# Patient Record
Sex: Female | Born: 1980 | Race: White | Hispanic: No | Smoking: Current every day smoker
Health system: Southern US, Community
[De-identification: ages and names within clinical notes are randomized; demographics above are authoritative.]

## PROBLEM LIST (undated history)

## (undated) DIAGNOSIS — F1110 Opioid abuse, uncomplicated: Secondary | ICD-10-CM

## (undated) DIAGNOSIS — B1920 Unspecified viral hepatitis C without hepatic coma: Secondary | ICD-10-CM

## (undated) DIAGNOSIS — F319 Bipolar disorder, unspecified: Secondary | ICD-10-CM

## (undated) DIAGNOSIS — F1010 Alcohol abuse, uncomplicated: Secondary | ICD-10-CM

## (undated) DIAGNOSIS — A4902 Methicillin resistant Staphylococcus aureus infection, unspecified site: Secondary | ICD-10-CM

## (undated) DIAGNOSIS — X838XXA Intentional self-harm by other specified means, initial encounter: Secondary | ICD-10-CM

## (undated) DIAGNOSIS — F419 Anxiety disorder, unspecified: Secondary | ICD-10-CM

## (undated) DIAGNOSIS — R45851 Suicidal ideations: Secondary | ICD-10-CM

## (undated) DIAGNOSIS — F1490 Cocaine use, unspecified, uncomplicated: Secondary | ICD-10-CM

## (undated) DIAGNOSIS — Z720 Tobacco use: Secondary | ICD-10-CM

## (undated) HISTORY — PX: CHOLECYSTECTOMY: SHX55

## (undated) HISTORY — PX: OTHER SURGICAL HISTORY: SHX169

## (undated) HISTORY — PX: EYE SURGERY: SHX253

## (undated) HISTORY — PX: TUBAL LIGATION: SHX77

---

## 2002-05-31 ENCOUNTER — Emergency Department (HOSPITAL_COMMUNITY): Admission: EM | Admit: 2002-05-31 | Discharge: 2002-05-31 | Payer: Self-pay | Admitting: *Deleted

## 2002-05-31 ENCOUNTER — Encounter: Payer: Self-pay | Admitting: *Deleted

## 2004-08-01 ENCOUNTER — Ambulatory Visit: Payer: Self-pay | Admitting: Psychiatry

## 2004-08-01 ENCOUNTER — Inpatient Hospital Stay (HOSPITAL_COMMUNITY): Admission: AD | Admit: 2004-08-01 | Discharge: 2004-08-07 | Payer: Self-pay | Admitting: Psychiatry

## 2004-08-05 ENCOUNTER — Encounter: Payer: Self-pay | Admitting: Psychiatry

## 2004-08-06 ENCOUNTER — Encounter (HOSPITAL_COMMUNITY): Payer: Self-pay | Admitting: Psychiatry

## 2005-01-10 ENCOUNTER — Emergency Department (HOSPITAL_COMMUNITY): Admission: EM | Admit: 2005-01-10 | Discharge: 2005-01-11 | Payer: Self-pay | Admitting: Emergency Medicine

## 2005-08-23 ENCOUNTER — Emergency Department (HOSPITAL_COMMUNITY): Admission: EM | Admit: 2005-08-23 | Discharge: 2005-08-23 | Payer: Self-pay | Admitting: Emergency Medicine

## 2005-10-04 ENCOUNTER — Inpatient Hospital Stay (HOSPITAL_COMMUNITY): Admission: AD | Admit: 2005-10-04 | Discharge: 2005-10-10 | Payer: Self-pay | Admitting: Psychiatry

## 2005-10-05 ENCOUNTER — Ambulatory Visit: Payer: Self-pay | Admitting: Psychiatry

## 2008-11-19 ENCOUNTER — Emergency Department (HOSPITAL_COMMUNITY): Admission: EM | Admit: 2008-11-19 | Discharge: 2008-11-19 | Payer: Self-pay | Admitting: Emergency Medicine

## 2009-01-09 ENCOUNTER — Emergency Department (HOSPITAL_COMMUNITY): Admission: EM | Admit: 2009-01-09 | Discharge: 2009-01-09 | Payer: Self-pay | Admitting: Emergency Medicine

## 2009-01-16 ENCOUNTER — Encounter: Payer: Self-pay | Admitting: Orthopedic Surgery

## 2009-01-22 ENCOUNTER — Emergency Department (HOSPITAL_COMMUNITY): Admission: EM | Admit: 2009-01-22 | Discharge: 2009-01-22 | Payer: Self-pay | Admitting: Emergency Medicine

## 2009-04-26 ENCOUNTER — Other Ambulatory Visit: Payer: Self-pay | Admitting: Emergency Medicine

## 2009-04-26 ENCOUNTER — Inpatient Hospital Stay (HOSPITAL_COMMUNITY): Admission: AD | Admit: 2009-04-26 | Discharge: 2009-05-02 | Payer: Self-pay | Admitting: Psychiatry

## 2009-04-26 ENCOUNTER — Ambulatory Visit: Payer: Self-pay | Admitting: Psychiatry

## 2009-04-29 ENCOUNTER — Encounter: Payer: Self-pay | Admitting: Emergency Medicine

## 2009-05-04 ENCOUNTER — Emergency Department (HOSPITAL_COMMUNITY): Admission: EM | Admit: 2009-05-04 | Discharge: 2009-05-04 | Payer: Self-pay | Admitting: Emergency Medicine

## 2009-05-06 ENCOUNTER — Emergency Department (HOSPITAL_COMMUNITY): Admission: EM | Admit: 2009-05-06 | Discharge: 2009-05-07 | Payer: Self-pay | Admitting: Emergency Medicine

## 2009-12-11 ENCOUNTER — Other Ambulatory Visit: Payer: Self-pay | Admitting: Emergency Medicine

## 2009-12-11 ENCOUNTER — Inpatient Hospital Stay (HOSPITAL_COMMUNITY): Admission: AD | Admit: 2009-12-11 | Discharge: 2009-12-13 | Payer: Self-pay | Admitting: Psychiatry

## 2009-12-11 ENCOUNTER — Ambulatory Visit: Payer: Self-pay | Admitting: Psychiatry

## 2010-02-13 ENCOUNTER — Inpatient Hospital Stay (HOSPITAL_COMMUNITY): Admission: EM | Admit: 2010-02-13 | Discharge: 2009-05-19 | Payer: Self-pay | Admitting: Emergency Medicine

## 2010-02-19 ENCOUNTER — Emergency Department (HOSPITAL_COMMUNITY)
Admission: EM | Admit: 2010-02-19 | Discharge: 2010-02-19 | Payer: Self-pay | Source: Home / Self Care | Admitting: Emergency Medicine

## 2010-03-02 ENCOUNTER — Inpatient Hospital Stay (HOSPITAL_COMMUNITY)
Admission: EM | Admit: 2010-03-02 | Discharge: 2010-03-06 | Disposition: A | Discharge status: Other Acute Inpatient Hospital | Payer: Self-pay | Source: Home / Self Care | Attending: Pulmonary Disease | Admitting: Pulmonary Disease

## 2010-03-05 DIAGNOSIS — F329 Major depressive disorder, single episode, unspecified: Secondary | ICD-10-CM

## 2010-03-06 ENCOUNTER — Inpatient Hospital Stay (HOSPITAL_COMMUNITY)
Admission: RE | Admit: 2010-03-06 | Discharge: 2010-03-12 | Payer: Self-pay | Source: Home / Self Care | Attending: Psychiatry | Admitting: Psychiatry

## 2010-05-19 LAB — ETHANOL: Alcohol, Ethyl (B): <5 mg/dL (ref 0–10)

## 2010-05-19 LAB — BASIC METABOLIC PANEL
BUN: 2 mg/dL — ABNORMAL LOW (ref 6–23)
BUN: 3 mg/dL — ABNORMAL LOW (ref 6–23)
CO2: 27 mEq/L (ref 19–32)
Calcium: 7.8 mg/dL — ABNORMAL LOW (ref 8.4–10.5)
Calcium: 8.1 mg/dL — ABNORMAL LOW (ref 8.4–10.5)
Chloride: 110 mEq/L (ref 96–112)
Creatinine, Ser: 0.83 mg/dL (ref 0.4–1.2)
Creatinine, Ser: 0.84 mg/dL (ref 0.4–1.2)
Creatinine, Ser: 0.89 mg/dL (ref 0.4–1.2)
GFR calc non Af Amer: >60 mL/min (ref >60)
GFR calc non Af Amer: >60 mL/min (ref >60)
GFR calc non Af Amer: >60 mL/min (ref >60)
Glucose, Bld: 125 mg/dL — ABNORMAL HIGH (ref 70–99)
Glucose, Bld: 94 mg/dL (ref 70–99)
Potassium: 3.7 mEq/L (ref 3.5–5.1)
Sodium: 140 mEq/L (ref 135–145)
Sodium: 141 mEq/L (ref 135–145)

## 2010-05-19 LAB — COMPREHENSIVE METABOLIC PANEL
ALT: 18 U/L (ref 0–35)
ALT: 19 U/L (ref 0–35)
AST: 18 U/L (ref 0–37)
Albumin: 2.8 g/dL — ABNORMAL LOW (ref 3.5–5.2)
Alkaline Phosphatase: 44 U/L (ref 39–117)
Alkaline Phosphatase: 62 U/L (ref 39–117)
BUN: 12 mg/dL (ref 6–23)
CO2: 24 mEq/L (ref 19–32)
CO2: 24 mEq/L (ref 19–32)
CO2: 29 mEq/L (ref 19–32)
Calcium: 8.6 mg/dL (ref 8.4–10.5)
Chloride: 108 mEq/L (ref 96–112)
Chloride: 109 mEq/L (ref 96–112)
Chloride: 111 mEq/L (ref 96–112)
Creatinine, Ser: 0.83 mg/dL (ref 0.4–1.2)
Creatinine, Ser: 0.84 mg/dL (ref 0.4–1.2)
GFR calc Af Amer: >60 mL/min (ref >60)
GFR calc Af Amer: >60 mL/min (ref >60)
GFR calc Af Amer: >60 mL/min (ref >60)
GFR calc non Af Amer: >60 mL/min (ref >60)
GFR calc non Af Amer: >60 mL/min (ref >60)
GFR calc non Af Amer: >60 mL/min (ref >60)
Glucose, Bld: 232 mg/dL — ABNORMAL HIGH (ref 70–99)
Potassium: 3.4 mEq/L — ABNORMAL LOW (ref 3.5–5.1)
Potassium: 3.6 mEq/L (ref 3.5–5.1)
Potassium: 3.7 mEq/L (ref 3.5–5.1)
Sodium: 142 mEq/L (ref 135–145)
Total Bilirubin: 0.1 mg/dL — ABNORMAL LOW (ref 0.3–1.2)
Total Bilirubin: 0.3 mg/dL (ref 0.3–1.2)
Total Bilirubin: 0.4 mg/dL (ref 0.3–1.2)

## 2010-05-19 LAB — BLOOD GAS, ARTERIAL
Acid-Base Excess: 1.5 mmol/L (ref 0.0–2.0)
Acid-base deficit: 4.5 mmol/L — ABNORMAL HIGH (ref 0.0–2.0)
Acid-base deficit: 5.2 mmol/L — ABNORMAL HIGH (ref 0.0–2.0)
Bicarbonate: 20.8 mEq/L (ref 20.0–24.0)
Bicarbonate: 21.1 mEq/L (ref 20.0–24.0)
Drawn by: 308601
MECHVT: 500 mL
PEEP: 5 cmH2O
PEEP: 5 cmH2O
Patient temperature: 98.6
RATE: 15 resp/min
TCO2: 19.3 mmol/L (ref 0–100)
TCO2: 19.7 mmol/L (ref 0–100)
pCO2 arterial: 45.3 mmHg — ABNORMAL HIGH (ref 35.0–45.0)
pH, Arterial: 7.285 — ABNORMAL LOW (ref 7.350–7.400)
pH, Arterial: 7.457 — ABNORMAL HIGH (ref 7.350–7.400)

## 2010-05-19 LAB — CBC
HCT: 40.2 % (ref 36.0–46.0)
Hemoglobin: 10.2 g/dL — ABNORMAL LOW (ref 12.0–15.0)
Hemoglobin: 12.9 g/dL (ref 12.0–15.0)
Hemoglobin: 13.1 g/dL (ref 12.0–15.0)
Hemoglobin: 9.9 g/dL — ABNORMAL LOW (ref 12.0–15.0)
MCH: 28.1 pg (ref 26.0–34.0)
MCH: 28.5 pg (ref 26.0–34.0)
MCHC: 30.4 g/dL (ref 30.0–36.0)
MCV: 91.2 fL (ref 78.0–100.0)
Platelets: 224 10*3/uL (ref 150–400)
RBC: 4.45 MIL/uL (ref 3.87–5.11)
RBC: 4.71 MIL/uL (ref 3.87–5.11)
RDW: 15.6 % — ABNORMAL HIGH (ref 11.5–15.5)
RDW: 16.2 % — ABNORMAL HIGH (ref 11.5–15.5)
RDW: 16.3 % — ABNORMAL HIGH (ref 11.5–15.5)
WBC: 12.1 10*3/uL — ABNORMAL HIGH (ref 4.0–10.5)
WBC: 15.1 10*3/uL — ABNORMAL HIGH (ref 4.0–10.5)

## 2010-05-19 LAB — MAGNESIUM: Magnesium: 2.4 mg/dL (ref 1.5–2.5)

## 2010-05-19 LAB — URINALYSIS, ROUTINE W REFLEX MICROSCOPIC
Bilirubin Urine: NEGATIVE
Glucose, UA: NEGATIVE mg/dL
Ketones, ur: NEGATIVE mg/dL
Nitrite: NEGATIVE
Protein, ur: NEGATIVE mg/dL
pH: 5.5 (ref 5.0–8.0)

## 2010-05-19 LAB — URINE MICROSCOPIC-ADD ON

## 2010-05-19 LAB — RAPID URINE DRUG SCREEN, HOSP PERFORMED
Barbiturates: NOT DETECTED
Barbiturates: NOT DETECTED
Opiates: NOT DETECTED
Opiates: NOT DETECTED
Tetrahydrocannabinol: POSITIVE — AB

## 2010-05-19 LAB — MRSA PCR SCREENING: MRSA by PCR: NEGATIVE

## 2010-05-19 LAB — PHOSPHORUS
Phosphorus: 2.4 mg/dL (ref 2.3–4.6)
Phosphorus: 3.6 mg/dL (ref 2.3–4.6)

## 2010-05-19 LAB — ACETAMINOPHEN LEVEL: Acetaminophen (Tylenol), Serum: <10 ug/mL — ABNORMAL LOW (ref 10–30)

## 2010-05-19 LAB — DIFFERENTIAL
Basophils Absolute: 0.1 10*3/uL (ref 0.0–0.1)
Eosinophils Relative: 1 % (ref 0–5)
Lymphocytes Relative: 26 % (ref 12–46)
Lymphs Abs: 3.1 10*3/uL (ref 0.7–4.0)
Monocytes Absolute: 0.4 10*3/uL (ref 0.1–1.0)

## 2010-05-19 LAB — PROTIME-INR: Prothrombin Time: 13.9 seconds (ref 11.6–15.2)

## 2010-05-21 LAB — HEPATIC FUNCTION PANEL
ALT: 14 U/L (ref 0–35)
Bilirubin, Direct: 0.1 mg/dL (ref 0.0–0.3)
Indirect Bilirubin: 0.8 mg/dL (ref 0.3–0.9)

## 2010-05-22 LAB — DIFFERENTIAL
Eosinophils Absolute: 0.2 10*3/uL (ref 0.0–0.7)
Lymphs Abs: 2 10*3/uL (ref 0.7–4.0)
Monocytes Relative: 4 % (ref 3–12)
Neutro Abs: 13 10*3/uL — ABNORMAL HIGH (ref 1.7–7.7)
Neutrophils Relative %: 82 % — ABNORMAL HIGH (ref 43–77)

## 2010-05-22 LAB — URINE MICROSCOPIC-ADD ON

## 2010-05-22 LAB — RAPID URINE DRUG SCREEN, HOSP PERFORMED
Opiates: NOT DETECTED
Tetrahydrocannabinol: POSITIVE — AB

## 2010-05-22 LAB — CBC
Hemoglobin: 12.8 g/dL (ref 12.0–15.0)
MCH: 28.1 pg (ref 26.0–34.0)
RBC: 4.56 MIL/uL (ref 3.87–5.11)

## 2010-05-22 LAB — URINALYSIS, ROUTINE W REFLEX MICROSCOPIC
Glucose, UA: NEGATIVE mg/dL
Hgb urine dipstick: NEGATIVE
Specific Gravity, Urine: 1.021 (ref 1.005–1.030)
pH: 6 (ref 5.0–8.0)

## 2010-05-22 LAB — POCT I-STAT, CHEM 8
Calcium, Ion: 1.25 mmol/L (ref 1.12–1.32)
HCT: 42 % (ref 36.0–46.0)
TCO2: 32 mmol/L (ref 0–100)

## 2010-05-22 LAB — GC/CHLAMYDIA PROBE AMP, GENITAL: GC Probe Amp, Genital: NEGATIVE

## 2010-05-29 LAB — URINE MICROSCOPIC-ADD ON

## 2010-05-29 LAB — POCT I-STAT, CHEM 8
BUN: 12 mg/dL (ref 6–23)
Chloride: 107 mEq/L (ref 96–112)
Creatinine, Ser: 1.4 mg/dL — ABNORMAL HIGH (ref 0.4–1.2)
Glucose, Bld: 90 mg/dL (ref 70–99)
Potassium: 3.3 mEq/L — ABNORMAL LOW (ref 3.5–5.1)
Sodium: 141 mEq/L (ref 135–145)

## 2010-05-29 LAB — ETHANOL: Alcohol, Ethyl (B): 5 mg/dL (ref 0–10)

## 2010-05-29 LAB — URINALYSIS, ROUTINE W REFLEX MICROSCOPIC
Glucose, UA: NEGATIVE mg/dL
Hgb urine dipstick: NEGATIVE
Leukocytes, UA: NEGATIVE
Specific Gravity, Urine: 1.03 (ref 1.005–1.030)
Urobilinogen, UA: 1 mg/dL (ref 0.0–1.0)

## 2010-05-29 LAB — DIFFERENTIAL
Eosinophils Absolute: 0 10*3/uL (ref 0.0–0.7)
Lymphocytes Relative: 10 % — ABNORMAL LOW (ref 12–46)
Lymphs Abs: 1.9 10*3/uL (ref 0.7–4.0)
Monocytes Relative: 4 % (ref 3–12)
Neutro Abs: 17.4 10*3/uL — ABNORMAL HIGH (ref 1.7–7.7)
Neutrophils Relative %: 86 % — ABNORMAL HIGH (ref 43–77)

## 2010-05-29 LAB — CBC
MCV: 85.3 fL (ref 78.0–100.0)
RBC: 4.42 MIL/uL (ref 3.87–5.11)
WBC: 20.3 10*3/uL — ABNORMAL HIGH (ref 4.0–10.5)

## 2010-05-29 LAB — RAPID URINE DRUG SCREEN, HOSP PERFORMED
Amphetamines: NOT DETECTED
Barbiturates: NOT DETECTED

## 2010-06-01 LAB — BASIC METABOLIC PANEL
Chloride: 103 mEq/L (ref 96–112)
Creatinine, Ser: 0.67 mg/dL (ref 0.4–1.2)
GFR calc Af Amer: >60 mL/min (ref >60)
Potassium: 4.2 mEq/L (ref 3.5–5.1)
Sodium: 139 mEq/L (ref 135–145)

## 2010-06-01 LAB — URINALYSIS, ROUTINE W REFLEX MICROSCOPIC
Bilirubin Urine: NEGATIVE
Glucose, UA: NEGATIVE mg/dL
Hgb urine dipstick: NEGATIVE
Specific Gravity, Urine: 1.021 (ref 1.005–1.030)
Urobilinogen, UA: 0.2 mg/dL (ref 0.0–1.0)
pH: 6.5 (ref 5.0–8.0)

## 2010-06-01 LAB — GRAM STAIN

## 2010-06-01 LAB — CBC
HCT: 32.2 % — ABNORMAL LOW (ref 36.0–46.0)
HCT: 38.9 % (ref 36.0–46.0)
Hemoglobin: 11.1 g/dL — ABNORMAL LOW (ref 12.0–15.0)
Hemoglobin: 11.2 g/dL — ABNORMAL LOW (ref 12.0–15.0)
Hemoglobin: 12.9 g/dL (ref 12.0–15.0)
MCHC: 33.1 g/dL (ref 30.0–36.0)
MCHC: 33.5 g/dL (ref 30.0–36.0)
MCV: 85.1 fL (ref 78.0–100.0)
MCV: 85.6 fL (ref 78.0–100.0)
MCV: 86.2 fL (ref 78.0–100.0)
Platelets: 354 10*3/uL (ref 150–400)
Platelets: 357 10*3/uL (ref 150–400)
RBC: 3.87 MIL/uL (ref 3.87–5.11)
RBC: 3.9 MIL/uL (ref 3.87–5.11)
RBC: 4.51 MIL/uL (ref 3.87–5.11)
RDW: 15.4 % (ref 11.5–15.5)
RDW: 15.5 % (ref 11.5–15.5)
RDW: 15.5 % (ref 11.5–15.5)
WBC: 13.1 10*3/uL — ABNORMAL HIGH (ref 4.0–10.5)
WBC: 20 10*3/uL — ABNORMAL HIGH (ref 4.0–10.5)

## 2010-06-01 LAB — POCT I-STAT, CHEM 8
Calcium, Ion: 1.13 mmol/L (ref 1.12–1.32)
Creatinine, Ser: 1 mg/dL (ref 0.4–1.2)
Glucose, Bld: 85 mg/dL (ref 70–99)
HCT: 41 % (ref 36.0–46.0)
Hemoglobin: 13.9 g/dL (ref 12.0–15.0)
TCO2: 33 mmol/L (ref 0–100)

## 2010-06-01 LAB — CULTURE, BLOOD (ROUTINE X 2)

## 2010-06-01 LAB — WOUND CULTURE

## 2010-06-01 LAB — DIFFERENTIAL
Basophils Relative: 1 % (ref 0–1)
Eosinophils Absolute: 0.4 10*3/uL (ref 0.0–0.7)
Lymphs Abs: 2.7 10*3/uL (ref 0.7–4.0)
Monocytes Absolute: 1 10*3/uL (ref 0.1–1.0)
Monocytes Relative: 5 % (ref 3–12)

## 2010-06-01 LAB — CULTURE, ROUTINE-ABSCESS

## 2010-06-01 LAB — POCT PREGNANCY, URINE: Preg Test, Ur: NEGATIVE

## 2010-06-01 LAB — ANAEROBIC CULTURE

## 2010-06-07 ENCOUNTER — Emergency Department (HOSPITAL_COMMUNITY)
Admission: EM | Admit: 2010-06-07 | Discharge: 2010-06-07 | Disposition: A | Discharge status: Home / Self Care | Payer: Medicaid Other | Attending: Emergency Medicine | Admitting: Emergency Medicine

## 2010-06-07 DIAGNOSIS — M545 Low back pain, unspecified: Secondary | ICD-10-CM | POA: Insufficient documentation

## 2010-06-07 DIAGNOSIS — R350 Frequency of micturition: Secondary | ICD-10-CM | POA: Insufficient documentation

## 2010-06-07 DIAGNOSIS — K2970 Gastritis, unspecified, without bleeding: Secondary | ICD-10-CM | POA: Insufficient documentation

## 2010-06-07 DIAGNOSIS — F319 Bipolar disorder, unspecified: Secondary | ICD-10-CM | POA: Insufficient documentation

## 2010-06-07 LAB — URINALYSIS, ROUTINE W REFLEX MICROSCOPIC
Glucose, UA: NEGATIVE mg/dL
Protein, ur: NEGATIVE mg/dL
pH: 7.5 (ref 5.0–8.0)
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
## 2010-07-25 NOTE — Discharge Summary (Signed)
NAMEJANYA, EVELAND NO.:  [id number]   MEDICAL RECORD NO.:  [id number]          PATIENT TYPE:  IPS   LOCATION:  0305                          [facility]ACILITY:  [facility]   PHYSICIAN:  Anselm Jungling, MD  DATE OF BIRTH:  Sep 24, 1980   DATE OF ADMISSION:  10/04/2005  DATE OF DISCHARGE:  10/10/2005                                 DISCHARGE SUMMARY   IDENTIFYING DATA AND REASON FOR ADMISSION:  The patient is a 30 year old  single white female, mother of two young children, homeless and jobless,  with a history of bipolar disorder and substance abuse.  This was her second  [facility] admission.  She was admitted after taking an overdose of a combination  of pills and alcohol.  Despite what appeared to be a fairly apparent history  of heavy irregular drinking, she stated, I am not an alcoholic.  She had  been prescribed Xanax, Paxil, and trazodone via her primary care physician.  She stated that these medications are not when I need.  Upon admission and  throughout her inpatient stay, she denied current suicidal ideation, but she  did want help.  Please refer to the admission note for further details  pertaining to the symptoms, circumstances, and history that led to her  hospitalization.  She was given initial Axis I diagnoses of history of  bipolar disorder, NOS, and history of substance abuse.   MEDICAL AND LABORATORY:  The patient was medically and physically assessed  by the psychiatric nurse practitioner.  There were no significant medical  issues.  She was essentially in good health.   HOSPITAL COURSE:  The patient was admitted to the adult inpatient  psychiatric service.  She presented as a well-nourished, well-developed  female who was alert and fully oriented, pleasant and relaxed.  Her mood  appeared neutral, though she complained of depression.  Her affect was  appropriate.  There were no signs or symptoms of psychosis or thought  disorder.  She denied suicidal  ideation.   The patient was placed on a regimen of medications intended to stabilize an  underlying bipolar disorder.  This consisted of Risperdal in relatively low  doses, and Depakote ER.  These medications were started at low-doses and  increased stepwise.  The patient appeared to have a large tolerance for  medication, necessitating several stepwise increases.  She experienced some  mild muscle cramping with Risperdal, and for this was given Cogentin 1 mg  t.i.d. with good results.   On October 09, 2005, the day prior to discharge, her valproic acid level was  51, on a dose of Depakote ER 1250 mg daily.  Following this, her Depakote  dose was increased to 1500 mg daily.   The patient was a reasonably good participant in the treatment program.  She  was cooperative, and got along well with peers and staff alike.  She was  placed briefly on a Librium withdrawal protocol, based upon an understanding  of heavy alcohol consumption, but she did not really show any signs or  symptoms of alcohol withdrawal during her stay.   On  the seventh hospital day, the patient appeared appropriate for discharge,  and she felt ready to continue on outpatient treatment.   AFTERCARE:  The patient was to follow up with psychiatric and counseling  resources that had yet to be scheduled at the time of this dictation, due to  her discharge occurring on the weekend.   DISCHARGE MEDICATIONS:  1.  Risperdal 1 mg b.i.d. and 2 mg q.h.s.  2.  Cogentin 1 mg t.i.d.  3.  Depakote ER 1500 mg q.h.s.  4.  Risperdal 0.5 mg up to b.i.d. p.r.n. agitation.  5.  Ambien 10 mg q.h.s. p.r.n. insomnia.  6.  Trazodone 200 mg q.h.s.   DISCHARGE DIAGNOSES:  AXIS I:  Bipolar disorder, not otherwise specified.  History of polysubstance abuse.  AXIS II:  Deferred.  AXIS III:  No acute or chronic illnesses.  AXIS IV:  Stressors severe.  AXIS V:  Global Assessment of Functioning on discharge 60.            ______________________________  Anselm Jungling, MD  Electronically Signed     SPB/MEDQ  D:  10/12/2005  T:  10/12/2005  Job:  [id number]

## 2010-10-04 ENCOUNTER — Emergency Department (HOSPITAL_COMMUNITY): Payer: No Typology Code available for payment source

## 2010-10-04 ENCOUNTER — Inpatient Hospital Stay (HOSPITAL_COMMUNITY)
Admission: EM | Admit: 2010-10-04 | Discharge: 2010-10-07 | DRG: 510 | Disposition: A | Discharge status: Home / Self Care | Payer: No Typology Code available for payment source | Attending: General Surgery | Admitting: General Surgery

## 2010-10-04 ENCOUNTER — Inpatient Hospital Stay (HOSPITAL_COMMUNITY): Payer: No Typology Code available for payment source

## 2010-10-04 DIAGNOSIS — S066X9A Traumatic subarachnoid hemorrhage with loss of consciousness of unspecified duration, initial encounter: Secondary | ICD-10-CM

## 2010-10-04 DIAGNOSIS — D62 Acute posthemorrhagic anemia: Secondary | ICD-10-CM | POA: Diagnosis present

## 2010-10-04 DIAGNOSIS — S066XAA Traumatic subarachnoid hemorrhage with loss of consciousness status unknown, initial encounter: Secondary | ICD-10-CM

## 2010-10-04 DIAGNOSIS — S52209A Unspecified fracture of shaft of unspecified ulna, initial encounter for closed fracture: Secondary | ICD-10-CM | POA: Diagnosis present

## 2010-10-04 DIAGNOSIS — S0180XA Unspecified open wound of other part of head, initial encounter: Secondary | ICD-10-CM

## 2010-10-04 DIAGNOSIS — S41109A Unspecified open wound of unspecified upper arm, initial encounter: Secondary | ICD-10-CM | POA: Diagnosis present

## 2010-10-04 DIAGNOSIS — S0230XA Fracture of orbital floor, unspecified side, initial encounter for closed fracture: Secondary | ICD-10-CM | POA: Diagnosis present

## 2010-10-04 DIAGNOSIS — S51009A Unspecified open wound of unspecified elbow, initial encounter: Secondary | ICD-10-CM | POA: Diagnosis present

## 2010-10-04 DIAGNOSIS — S129XXA Fracture of neck, unspecified, initial encounter: Secondary | ICD-10-CM

## 2010-10-04 DIAGNOSIS — Y998 Other external cause status: Secondary | ICD-10-CM

## 2010-10-04 DIAGNOSIS — J329 Chronic sinusitis, unspecified: Secondary | ICD-10-CM | POA: Diagnosis present

## 2010-10-04 DIAGNOSIS — Y92410 Unspecified street and highway as the place of occurrence of the external cause: Secondary | ICD-10-CM

## 2010-10-04 DIAGNOSIS — Z1889 Other specified retained foreign body fragments: Secondary | ICD-10-CM

## 2010-10-04 DIAGNOSIS — F319 Bipolar disorder, unspecified: Secondary | ICD-10-CM | POA: Diagnosis present

## 2010-10-04 DIAGNOSIS — S12000A Unspecified displaced fracture of first cervical vertebra, initial encounter for closed fracture: Principal | ICD-10-CM | POA: Diagnosis present

## 2010-10-04 DIAGNOSIS — S61209A Unspecified open wound of unspecified finger without damage to nail, initial encounter: Secondary | ICD-10-CM | POA: Diagnosis present

## 2010-10-04 DIAGNOSIS — F17200 Nicotine dependence, unspecified, uncomplicated: Secondary | ICD-10-CM | POA: Diagnosis present

## 2010-10-04 DIAGNOSIS — S0100XA Unspecified open wound of scalp, initial encounter: Secondary | ICD-10-CM | POA: Diagnosis present

## 2010-10-04 LAB — URINALYSIS, ROUTINE W REFLEX MICROSCOPIC
Bilirubin Urine: NEGATIVE
Ketones, ur: NEGATIVE mg/dL
Leukocytes, UA: NEGATIVE
Nitrite: NEGATIVE
Protein, ur: NEGATIVE mg/dL
Urobilinogen, UA: 0.2 mg/dL (ref 0.0–1.0)

## 2010-10-04 LAB — BASIC METABOLIC PANEL
BUN: 7 mg/dL (ref 6–23)
Chloride: 105 mEq/L (ref 96–112)
Creatinine, Ser: 0.68 mg/dL (ref 0.50–1.10)
GFR calc Af Amer: >60 mL/min (ref >60)
GFR calc non Af Amer: >60 mL/min (ref >60)

## 2010-10-04 LAB — DIFFERENTIAL
Basophils Relative: 0 % (ref 0–1)
Eosinophils Absolute: 0.3 10*3/uL (ref 0.0–0.7)
Eosinophils Relative: 1 % (ref 0–5)
Lymphocytes Relative: 17 % (ref 12–46)
Monocytes Absolute: 1.6 10*3/uL — ABNORMAL HIGH (ref 0.1–1.0)
Neutrophils Relative %: 76 % (ref 43–77)

## 2010-10-04 LAB — POCT I-STAT, CHEM 8
BUN: 6 mg/dL (ref 6–23)
Chloride: 105 mEq/L (ref 96–112)
HCT: 40 % (ref 36.0–46.0)
Sodium: 141 mEq/L (ref 135–145)

## 2010-10-04 LAB — CBC
HCT: 38.7 % (ref 36.0–46.0)
MCV: 88.2 fL (ref 78.0–100.0)
Platelets: 349 10*3/uL (ref 150–400)
RBC: 4.39 MIL/uL (ref 3.87–5.11)
RDW: 16.3 % — ABNORMAL HIGH (ref 11.5–15.5)
WBC: 26.6 10*3/uL — ABNORMAL HIGH (ref 4.0–10.5)

## 2010-10-04 LAB — MRSA PCR SCREENING: MRSA by PCR: NEGATIVE

## 2010-10-04 LAB — APTT: aPTT: 26 seconds (ref 24–37)

## 2010-10-04 LAB — PROTIME-INR
INR: 1.04 (ref 0.00–1.49)
Prothrombin Time: 13.8 seconds (ref 11.6–15.2)

## 2010-10-04 MED ORDER — IOHEXOL 300 MG/ML  SOLN
100.0000 mL | Freq: Once | INTRAMUSCULAR | Status: AC | PRN
  Administered 2010-10-04: 100 mL via INTRAVENOUS

## 2010-10-05 ENCOUNTER — Inpatient Hospital Stay (HOSPITAL_COMMUNITY): Payer: No Typology Code available for payment source

## 2010-10-05 LAB — CBC
MCH: 28.4 pg (ref 26.0–34.0)
MCHC: 32 g/dL (ref 30.0–36.0)
Platelets: 217 10*3/uL (ref 150–400)
RDW: 16.6 % — ABNORMAL HIGH (ref 11.5–15.5)

## 2010-10-05 LAB — BASIC METABOLIC PANEL
Calcium: 9 mg/dL (ref 8.4–10.5)
GFR calc Af Amer: >60 mL/min (ref >60)
GFR calc non Af Amer: >60 mL/min (ref >60)
Potassium: 3.9 mEq/L (ref 3.5–5.1)
Sodium: 142 mEq/L (ref 135–145)

## 2010-10-06 LAB — CBC
Hemoglobin: 8.5 g/dL — ABNORMAL LOW (ref 12.0–15.0)
MCH: 28.1 pg (ref 26.0–34.0)
Platelets: 155 10*3/uL (ref 150–400)
RBC: 3.02 MIL/uL — ABNORMAL LOW (ref 3.87–5.11)
WBC: 12.2 10*3/uL — ABNORMAL HIGH (ref 4.0–10.5)

## 2010-10-07 LAB — CBC
HCT: 26.1 % — ABNORMAL LOW (ref 36.0–46.0)
Hemoglobin: 8.3 g/dL — ABNORMAL LOW (ref 12.0–15.0)
RBC: 2.93 MIL/uL — ABNORMAL LOW (ref 3.87–5.11)
RDW: 16 % — ABNORMAL HIGH (ref 11.5–15.5)
WBC: 9.2 10*3/uL (ref 4.0–10.5)
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
## 2010-10-11 ENCOUNTER — Emergency Department (HOSPITAL_COMMUNITY)
Admission: EM | Admit: 2010-10-11 | Discharge: 2010-10-11 | Disposition: A | Discharge status: Home / Self Care | Payer: No Typology Code available for payment source | Attending: Emergency Medicine | Admitting: Emergency Medicine

## 2010-10-11 ENCOUNTER — Emergency Department (HOSPITAL_COMMUNITY): Payer: No Typology Code available for payment source

## 2010-10-11 DIAGNOSIS — R51 Headache: Secondary | ICD-10-CM | POA: Insufficient documentation

## 2010-10-11 DIAGNOSIS — M545 Low back pain, unspecified: Secondary | ICD-10-CM | POA: Insufficient documentation

## 2010-10-11 DIAGNOSIS — I6200 Nontraumatic subdural hemorrhage, unspecified: Secondary | ICD-10-CM | POA: Insufficient documentation

## 2010-10-11 DIAGNOSIS — F3130 Bipolar disorder, current episode depressed, mild or moderate severity, unspecified: Secondary | ICD-10-CM | POA: Insufficient documentation

## 2010-10-11 DIAGNOSIS — M542 Cervicalgia: Secondary | ICD-10-CM | POA: Insufficient documentation

## 2010-10-11 DIAGNOSIS — H53149 Visual discomfort, unspecified: Secondary | ICD-10-CM | POA: Insufficient documentation
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
## 2010-11-19 ENCOUNTER — Other Ambulatory Visit: Payer: Self-pay | Admitting: Neurosurgery

## 2010-11-19 ENCOUNTER — Ambulatory Visit
Admission: RE | Admit: 2010-11-19 | Discharge: 2010-11-19 | Disposition: A | Discharge status: Home / Self Care | Payer: Medicaid Other | Source: Ambulatory Visit | Attending: Neurosurgery | Admitting: Neurosurgery

## 2010-11-19 DIAGNOSIS — M542 Cervicalgia: Secondary | ICD-10-CM

## 2011-03-16 ENCOUNTER — Encounter: Payer: Self-pay | Admitting: *Deleted

## 2011-03-16 ENCOUNTER — Emergency Department (HOSPITAL_COMMUNITY)
Admission: EM | Admit: 2011-03-16 | Discharge: 2011-03-16 | Disposition: A | Discharge status: Home / Self Care | Payer: Medicaid Other | Attending: Emergency Medicine | Admitting: Emergency Medicine

## 2011-03-16 DIAGNOSIS — R509 Fever, unspecified: Secondary | ICD-10-CM | POA: Insufficient documentation

## 2011-03-16 DIAGNOSIS — F17200 Nicotine dependence, unspecified, uncomplicated: Secondary | ICD-10-CM | POA: Insufficient documentation

## 2011-03-16 DIAGNOSIS — R05 Cough: Secondary | ICD-10-CM

## 2011-03-16 DIAGNOSIS — R059 Cough, unspecified: Secondary | ICD-10-CM | POA: Insufficient documentation

## 2011-03-16 DIAGNOSIS — J3489 Other specified disorders of nose and nasal sinuses: Secondary | ICD-10-CM | POA: Insufficient documentation

## 2011-03-16 DIAGNOSIS — R0789 Other chest pain: Secondary | ICD-10-CM | POA: Insufficient documentation

## 2011-03-16 MED ORDER — ALBUTEROL SULFATE HFA 108 (90 BASE) MCG/ACT IN AERS
2.0000 | INHALATION_SPRAY | Freq: Once | RESPIRATORY_TRACT | Status: AC
  Administered 2011-03-16: 2 via RESPIRATORY_TRACT
  Filled 2011-03-16: qty 6.7

## 2011-03-16 MED ORDER — HYDROCOD POLST-CHLORPHEN POLST 10-8 MG/5ML PO LQCR: 5.0000 mL | Freq: Two times a day (BID) | ORAL | Status: DC | PRN

## 2011-03-16 MED ORDER — AZITHROMYCIN 250 MG PO TABS: ORAL_TABLET | ORAL | Status: DC

## 2011-03-16 MED ORDER — HYDROCOD POLST-CHLORPHEN POLST 10-8 MG/5ML PO LQCR
5.0000 mL | Freq: Once | ORAL | Status: AC
  Administered 2011-03-16: 5 mL via ORAL
  Filled 2011-03-16: qty 5

## 2011-03-16 MED ORDER — AZITHROMYCIN 250 MG PO TABS
500.0000 mg | ORAL_TABLET | Freq: Once | ORAL | Status: AC
  Administered 2011-03-16: 500 mg via ORAL
  Filled 2011-03-16: qty 2

## 2011-03-16 NOTE — ED Notes (Signed)
Cough with fever , intermittent  For a week

## 2011-03-16 NOTE — ED Provider Notes (Signed)
History     CSN: [id number]  Arrival date & time 03/16/11  4098   First MD Initiated Contact with Patient 03/16/11 2005      Chief Complaint  Patient presents with  . Cough    (Consider location/radiation/quality/duration/timing/severity/associated sxs/prior treatment) HPI Comments: Patient c/o intermittent productive cough and fever for 1-2 days.  States she has episodes of persistent cough that makes her chest feel tight.  She denies vomiting or shortness of breath.  Also denies bloody sputum.  Patient is a 31 y.o. female presenting with cough.  Cough This is a new problem. The current episode started more than 2 days ago. The problem occurs every few minutes. The problem has not changed since onset.The cough is productive of sputum. The maximum temperature recorded prior to her arrival was 100 to 100.9 F. The fever has been present for 1 to 2 days. Associated symptoms include chest pain and chills. Pertinent negatives include no weight loss, no ear congestion, no headaches, no rhinorrhea, no sore throat, no shortness of breath and no wheezing. She has tried nothing for the symptoms. The treatment provided no relief. She is a smoker. Her past medical history is significant for bronchitis. Her past medical history does not include pneumonia or asthma.    History reviewed. No pertinent past medical history.  Past Surgical History  Procedure Date  . Eye surgery     No family history on file.  History  Substance Use Topics  . Smoking status: Current Everyday Smoker -- 1.0 packs/day  . Smokeless tobacco: Not on file  . Alcohol Use: No    OB History    Grav Para Term Preterm Abortions TAB SAB Ect Mult Living                  Review of Systems  Constitutional: Positive for fever and chills. Negative for weight loss.  HENT: Positive for congestion. Negative for sore throat, rhinorrhea, neck pain and neck stiffness.   Respiratory: Positive for cough and chest tightness.  Negative for shortness of breath and wheezing.   Cardiovascular: Positive for chest pain.  Gastrointestinal: Negative for vomiting and abdominal pain.  Musculoskeletal: Negative.   Skin: Negative.   Neurological: Negative for dizziness and headaches.  Hematological: Negative for adenopathy.  All other systems reviewed and are negative.    Allergies  Review of patient's allergies indicates no known allergies.  Home Medications  No current outpatient prescriptions on file.  BP 131/84  Pulse 83  Temp(Src) 98.3 F (36.8 C) (Oral)  Resp 20  Ht 5\' 9"  (1.753 m)  Wt 180 lb (81.647 kg)  BMI 26.58 kg/m2  SpO2 100%  LMP 03/12/2011  Physical Exam  Nursing note and vitals reviewed. Constitutional: She is oriented to person, place, and time. She appears well-developed and well-nourished. No distress.  HENT:  Head: Normocephalic and atraumatic.  Right Ear: Tympanic membrane and ear canal normal.  Left Ear: Tympanic membrane and ear canal normal.  Mouth/Throat: Oropharynx is clear and moist.  Neck: Normal range of motion. Neck supple.  Cardiovascular: Normal rate, regular rhythm and normal heart sounds.   Pulmonary/Chest: Effort normal and breath sounds normal. No respiratory distress. She has no wheezes. She has no rales. She exhibits no tenderness.       Coarse lungs sounds bilaterally with few scattered inspir and expir wheezes.  Clears slightly after coughing  Abdominal: Soft. She exhibits no distension. There is no tenderness.  Musculoskeletal: Normal range of motion. She exhibits no  tenderness.  Lymphadenopathy:    She has no cervical adenopathy.  Neurological: She is alert and oriented to person, place, and time. No cranial nerve deficit. She exhibits normal muscle tone. Coordination normal.  Skin: Skin is warm and dry.    ED Course  Procedures (including critical care time)      MDM     8:41 PM vitals are stable, no hypoxia or tachypnea.  Non-toxic appearing.  Lung  sounds are coarse throughout with scattered inspir and expir wheezes.  Clinical suspicion for PNA is low.  She agrees to return here in 1-2 days if the sx's are not improving.  I will start her on albuterol, Zithromax and tussionex     Khushboo Chuck L. Wendy Hoback, [place] 03/18/11 1258

## 2011-03-16 NOTE — ED Notes (Signed)
Cough congestion, fever at times for a week

## 2011-03-19 NOTE — ED Provider Notes (Signed)
Medical screening examination/treatment/procedure(s) were performed by non-physician practitioner and as supervising physician I was immediately available for consultation/collaboration.  Martha K Linker, MD 03/19/11 1317 

## 2011-09-24 ENCOUNTER — Emergency Department (HOSPITAL_COMMUNITY)
Admission: EM | Admit: 2011-09-24 | Discharge: 2011-09-24 | Disposition: A | Discharge status: Home / Self Care | Payer: Medicaid Other | Attending: Emergency Medicine | Admitting: Emergency Medicine

## 2011-09-24 ENCOUNTER — Encounter (HOSPITAL_COMMUNITY): Payer: Self-pay | Admitting: Emergency Medicine

## 2011-09-24 ENCOUNTER — Emergency Department (HOSPITAL_COMMUNITY): Payer: Medicaid Other

## 2011-09-24 DIAGNOSIS — Z79899 Other long term (current) drug therapy: Secondary | ICD-10-CM | POA: Insufficient documentation

## 2011-09-24 DIAGNOSIS — M549 Dorsalgia, unspecified: Secondary | ICD-10-CM | POA: Insufficient documentation

## 2011-09-24 DIAGNOSIS — F17200 Nicotine dependence, unspecified, uncomplicated: Secondary | ICD-10-CM | POA: Insufficient documentation

## 2011-09-24 DIAGNOSIS — R109 Unspecified abdominal pain: Secondary | ICD-10-CM | POA: Insufficient documentation

## 2011-09-24 LAB — URINALYSIS, ROUTINE W REFLEX MICROSCOPIC
Bilirubin Urine: NEGATIVE
Hgb urine dipstick: NEGATIVE
Specific Gravity, Urine: 1.025 (ref 1.005–1.030)
Urobilinogen, UA: 0.2 mg/dL (ref 0.0–1.0)

## 2011-09-24 LAB — CBC WITH DIFFERENTIAL/PLATELET
Eosinophils Relative: 3 % (ref 0–5)
HCT: 42.5 % (ref 36.0–46.0)
Hemoglobin: 13.3 g/dL (ref 12.0–15.0)
Lymphocytes Relative: 24 % (ref 12–46)
Lymphs Abs: 3 10*3/uL (ref 0.7–4.0)
MCV: 83.8 fL (ref 78.0–100.0)
Monocytes Absolute: 0.7 10*3/uL (ref 0.1–1.0)
Monocytes Relative: 5 % (ref 3–12)
RBC: 5.07 MIL/uL (ref 3.87–5.11)
RDW: 16.3 % — ABNORMAL HIGH (ref 11.5–15.5)
WBC: 12.2 10*3/uL — ABNORMAL HIGH (ref 4.0–10.5)

## 2011-09-24 LAB — COMPREHENSIVE METABOLIC PANEL
BUN: 17 mg/dL (ref 6–23)
CO2: 30 mEq/L (ref 19–32)
Calcium: 9.4 mg/dL (ref 8.4–10.5)
Creatinine, Ser: 0.84 mg/dL (ref 0.50–1.10)
GFR calc Af Amer: >90 mL/min (ref >90)
GFR calc non Af Amer: >90 mL/min (ref >90)
Glucose, Bld: 90 mg/dL (ref 70–99)
Total Bilirubin: 0.2 mg/dL — ABNORMAL LOW (ref 0.3–1.2)

## 2011-09-24 MED ORDER — SULFAMETHOXAZOLE-TRIMETHOPRIM 800-160 MG PO TABS: 1.0000 | ORAL_TABLET | Freq: Two times a day (BID) | ORAL | Status: AC

## 2011-09-24 MED ORDER — TRAMADOL HCL 50 MG PO TABS: 50.0000 mg | ORAL_TABLET | Freq: Four times a day (QID) | ORAL | Status: AC | PRN

## 2011-09-24 MED ORDER — IBUPROFEN 800 MG PO TABS
800.0000 mg | ORAL_TABLET | Freq: Once | ORAL | Status: AC
  Administered 2011-09-24: 800 mg via ORAL
  Filled 2011-09-24: qty 1

## 2011-09-24 NOTE — ED Notes (Signed)
Pt c/o intermittent left flank pain radiating into upper abd. Pt also reports urinary frequency. Denies v/d. nad noted.

## 2011-09-24 NOTE — ED Provider Notes (Signed)
History  This chart was scribed for Benny Lennert, MD by Erskine Emery. This patient was seen in room APA10/APA10 and the patient's care was started at 09:55.   CSN: [id number]  Arrival date & time 09/24/11  0940   First MD Initiated Contact with Patient 09/24/11 [phone]      Chief Complaint  Patient presents with  . Flank Pain    (Consider location/radiation/quality/duration/timing/severity/associated sxs/prior treatment) Patient is a 31 y.o. female presenting with flank pain. The history is provided by the patient. No language interpreter was used.  Flank Pain This is a new problem. The current episode started more than 1 week ago. The problem occurs constantly (Radiates around the abdomen). The problem has not changed since onset.Pertinent negatives include no chest pain and no headaches. Nothing aggravates the symptoms. Nothing relieves the symptoms. Treatments tried: Tylenol and asprin. The treatment provided no relief.    History reviewed. No pertinent past medical history.  Past Surgical History  Procedure Date  . Eye surgery   . Arm   . Tubal ligation     No family history on file.  History  Substance Use Topics  . Smoking status: Current Everyday Smoker -- 1.0 packs/day  . Smokeless tobacco: Not on file  . Alcohol Use: No    OB History    Grav Para Term Preterm Abortions TAB SAB Ect Mult Living                  Review of Systems  Constitutional: Negative for fatigue.  HENT: Negative for congestion, sinus pressure and ear discharge.   Eyes: Negative for discharge.  Respiratory: Negative for cough.   Cardiovascular: Negative for chest pain.  Gastrointestinal: Negative for diarrhea.  Genitourinary: Positive for flank pain. Negative for dysuria and hematuria.  Musculoskeletal: Negative for back pain.  Skin: Negative for rash.  Neurological: Negative for seizures and headaches.  Hematological: Negative.   Psychiatric/Behavioral: Negative for hallucinations.      Allergies  Review of patient's allergies indicates no known allergies.  Home Medications   Current Outpatient Rx  Name Route Sig Dispense Refill  . ACETAMINOPHEN-CODEINE #3 300-30 MG PO TABS Oral Take 1 tablet by mouth every 4 (four) hours as needed. FOR PAIN     . AZITHROMYCIN 250 MG PO TABS  Take two tablets on day one, then one tab qd days 2-5 Take first 2 tablets together, then 1 every day until finished. 6 tablet 0  . HYDROCOD POLST-CPM POLST ER 10-8 MG/5ML PO LQCR Oral Take 5 mLs by mouth every 12 (twelve) hours as needed. 120 mL 0  . IBUPROFEN 200 MG PO TABS Oral Take 400-800 mg by mouth as needed. FOR PAIN       Triage Vitals: BP 137/82  Pulse 72  Temp 98.3 F (36.8 C)  Resp 18  Ht 5\' 9"  (1.753 m)  Wt 175 lb (79.379 kg)  BMI 25.84 kg/m2  SpO2 100%  LMP 09/10/2011  Physical Exam  Constitutional: She is oriented to person, place, and time. She appears well-developed.  HENT:  Head: Normocephalic and atraumatic.  Eyes: Conjunctivae and EOM are normal. No scleral icterus.  Neck: Neck supple. No thyromegaly present.  Cardiovascular: Normal rate and regular rhythm.  Exam reveals no gallop and no friction rub.   No murmur heard. Pulmonary/Chest: No stridor. She has no wheezes. She has no rales. She exhibits no tenderness.  Abdominal: She exhibits no distension. There is tenderness. There is no rebound.  Musculoskeletal: Normal  range of motion. She exhibits no edema.       Left flank tenderness  Lymphadenopathy:    She has no cervical adenopathy.  Neurological: She is oriented to person, place, and time. Coordination normal.  Skin: No rash noted. No erythema.  Psychiatric: She has a normal mood and affect. Her behavior is normal.    ED Course  Procedures (including critical care time)  DIAGNOSTIC STUDIES: Oxygen Saturation is 100% on room air, normal by my interpretation.    COORDINATION OF CARE:  10:00--I discussed treatment plan including motrin for pain and  a urinalysis with pt and pt agreed.  11:21--I discussed the urinalysis with pt and let her know that I would inform her of more results to come.  12:51--I told the pt that her labs show no concerning results but explained that I will treat her for a kidney infection just in case. I informed the pt that I intend to discharge her.    Labs Reviewed  URINALYSIS, ROUTINE W REFLEX MICROSCOPIC   No results found.   No diagnosis found.    MDM   Pt with flank pain will culture urine.         Benny Lennert, MD 09/24/11 1320

## 2011-09-24 NOTE — ED Notes (Signed)
Patient stated that she started feeling flank pain about two weeks.  Patient stated that it was sudden and has progressively gotten worse over and the pain has radiated to the left side of abdomen beneath her rib cage.  Patient stated her pain was a 8.

## 2011-09-26 LAB — URINE CULTURE

## 2012-01-23 IMAGING — DX DG CHEST 1V PORT
1 series · 1 of 1 positions shown · non-contrast
Comparison: Chest x-ray of 08/06/2004

CLINICAL DATA: Fell, pain, overdose, unresponsive

PORTABLE CHEST - 1 VIEW

[AP]
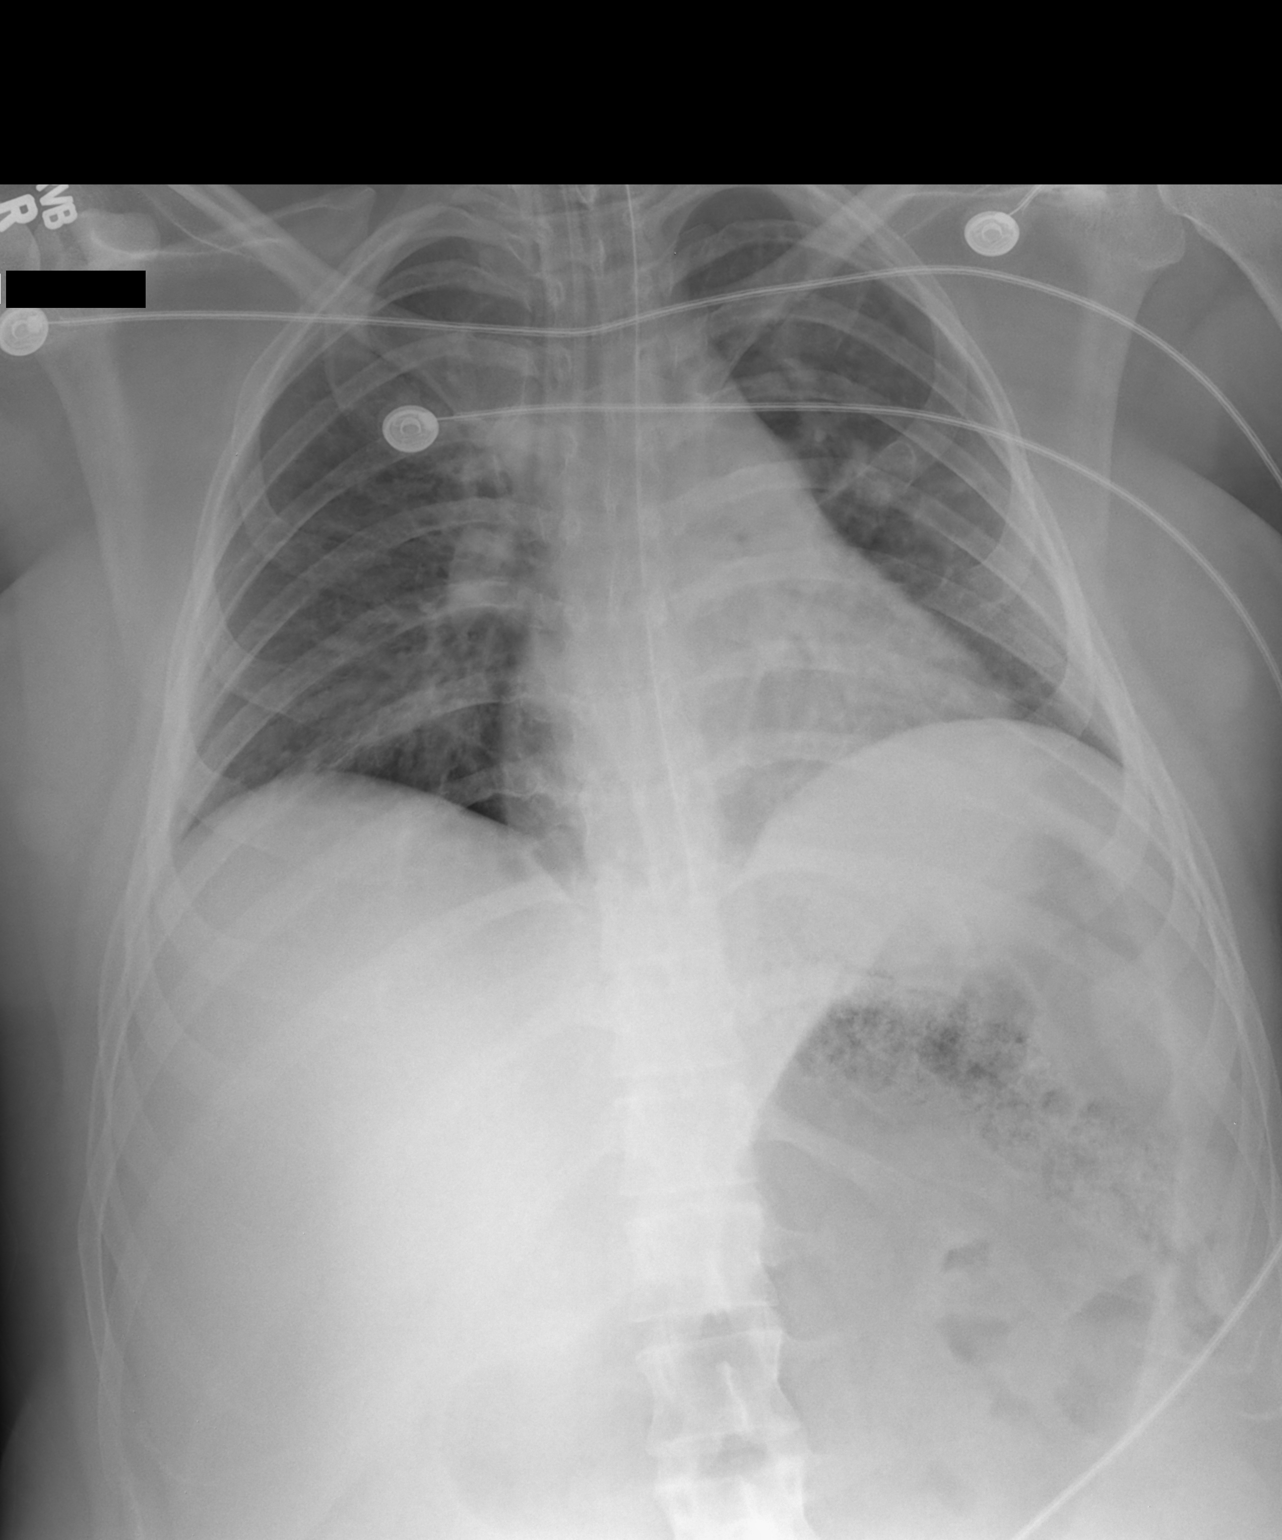

[1 of 1 positions shown; findings below may reference images not displayed]

FINDINGS: The tip of the endotracheal tube is within the right
mainstem bronchus and could be withdrawn 3.5-4 cm.  Mild basilar
volume loss is present.  The heart is within normal limits in size.
NG tube is noted with the tip near the GE junction.  The stomach is
distended with gas.
IMPRESSION: 1.  Tip of endotracheal tube in the right mainstem bronchus.
Withdraw 3.5-4 cm.
2.  NG tube tip near GE junction.  Recommend advancing.

## 2012-05-12 ENCOUNTER — Encounter (HOSPITAL_COMMUNITY): Payer: Self-pay

## 2012-05-12 ENCOUNTER — Emergency Department (HOSPITAL_COMMUNITY)
Admission: EM | Admit: 2012-05-12 | Discharge: 2012-05-12 | Disposition: A | Discharge status: Home / Self Care | Payer: Self-pay | Attending: Emergency Medicine | Admitting: Emergency Medicine

## 2012-05-12 DIAGNOSIS — S61509A Unspecified open wound of unspecified wrist, initial encounter: Secondary | ICD-10-CM | POA: Insufficient documentation

## 2012-05-12 DIAGNOSIS — Z23 Encounter for immunization: Secondary | ICD-10-CM | POA: Insufficient documentation

## 2012-05-12 DIAGNOSIS — F17200 Nicotine dependence, unspecified, uncomplicated: Secondary | ICD-10-CM | POA: Insufficient documentation

## 2012-05-12 DIAGNOSIS — Z8659 Personal history of other mental and behavioral disorders: Secondary | ICD-10-CM | POA: Insufficient documentation

## 2012-05-12 DIAGNOSIS — T148XXA Other injury of unspecified body region, initial encounter: Secondary | ICD-10-CM

## 2012-05-12 DIAGNOSIS — X789XXA Intentional self-harm by unspecified sharp object, initial encounter: Secondary | ICD-10-CM | POA: Insufficient documentation

## 2012-05-12 HISTORY — DX: Bipolar disorder, unspecified: F31.9

## 2012-05-12 HISTORY — DX: Anxiety disorder, unspecified: F41.9

## 2012-05-12 MED ORDER — TETANUS-DIPHTH-ACELL PERTUSSIS 5-2.5-18.5 LF-MCG/0.5 IM SUSP
0.5000 mL | Freq: Once | INTRAMUSCULAR | Status: AC
  Administered 2012-05-12: 0.5 mL via INTRAMUSCULAR
  Filled 2012-05-12: qty 0.5
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
## 2012-07-14 ENCOUNTER — Emergency Department (HOSPITAL_COMMUNITY)
Admission: EM | Admit: 2012-07-14 | Discharge: 2012-07-15 | Disposition: A | Discharge status: Home / Self Care | Payer: Self-pay | Attending: Emergency Medicine | Admitting: Emergency Medicine

## 2012-07-14 ENCOUNTER — Encounter (HOSPITAL_COMMUNITY): Payer: Self-pay | Admitting: *Deleted

## 2012-07-14 DIAGNOSIS — F319 Bipolar disorder, unspecified: Secondary | ICD-10-CM | POA: Insufficient documentation

## 2012-07-14 DIAGNOSIS — F1910 Other psychoactive substance abuse, uncomplicated: Secondary | ICD-10-CM | POA: Insufficient documentation

## 2012-07-14 DIAGNOSIS — F17200 Nicotine dependence, unspecified, uncomplicated: Secondary | ICD-10-CM | POA: Insufficient documentation

## 2012-07-14 DIAGNOSIS — Z3202 Encounter for pregnancy test, result negative: Secondary | ICD-10-CM | POA: Insufficient documentation

## 2012-07-14 DIAGNOSIS — Z79899 Other long term (current) drug therapy: Secondary | ICD-10-CM | POA: Insufficient documentation

## 2012-07-14 DIAGNOSIS — R42 Dizziness and giddiness: Secondary | ICD-10-CM | POA: Insufficient documentation

## 2012-07-14 DIAGNOSIS — F411 Generalized anxiety disorder: Secondary | ICD-10-CM | POA: Insufficient documentation

## 2012-07-14 LAB — CBC WITH DIFFERENTIAL/PLATELET
Basophils Relative: 1 % (ref 0–1)
HCT: 35.5 % — ABNORMAL LOW (ref 36.0–46.0)
Hemoglobin: 11.7 g/dL — ABNORMAL LOW (ref 12.0–15.0)
Lymphs Abs: 3.3 10*3/uL (ref 0.7–4.0)
MCHC: 33 g/dL (ref 30.0–36.0)
Monocytes Absolute: 0.4 10*3/uL (ref 0.1–1.0)
Monocytes Relative: 5 % (ref 3–12)
Neutro Abs: 3.2 10*3/uL (ref 1.7–7.7)
Neutrophils Relative %: 44 % (ref 43–77)
RBC: 4.05 MIL/uL (ref 3.87–5.11)

## 2012-07-14 LAB — RAPID URINE DRUG SCREEN, HOSP PERFORMED
Cocaine: POSITIVE — AB
Opiates: NOT DETECTED
Tetrahydrocannabinol: NOT DETECTED

## 2012-07-14 LAB — URINALYSIS, ROUTINE W REFLEX MICROSCOPIC
Bilirubin Urine: NEGATIVE
Glucose, UA: NEGATIVE mg/dL
Ketones, ur: NEGATIVE mg/dL
Specific Gravity, Urine: 1.009 (ref 1.005–1.030)
pH: 5.5 (ref 5.0–8.0)

## 2012-07-14 LAB — URINE MICROSCOPIC-ADD ON

## 2012-07-14 NOTE — ED Notes (Addendum)
[facility] sent pt. Here b/c pts. Speech is slurred speech, off balance; started taking prosac, xanax, trazodone.  Believes she is being over medicated. Over medicated with librium this past Monday..she thinks.

## 2012-07-14 NOTE — ED Notes (Signed)
Per Janae Sauce RN from [place], pt admitted to [facility] on Monday for withdrawal opiates and benzo's, pt's behavior or symptoms have not improved since Monday. They suspect pt is still using something that they are unable to locate on pt or in her room. They want pt evaluated to rule out a possible medical condition.

## 2012-07-15 ENCOUNTER — Encounter (HOSPITAL_COMMUNITY): Payer: Self-pay | Admitting: Radiology

## 2012-07-15 ENCOUNTER — Emergency Department (HOSPITAL_COMMUNITY): Payer: Self-pay

## 2012-07-15 LAB — COMPREHENSIVE METABOLIC PANEL
Albumin: 3.4 g/dL — ABNORMAL LOW (ref 3.5–5.2)
Alkaline Phosphatase: 56 U/L (ref 39–117)
BUN: 11 mg/dL (ref 6–23)
CO2: 29 mEq/L (ref 19–32)
Chloride: 105 mEq/L (ref 96–112)
Creatinine, Ser: 0.93 mg/dL (ref 0.50–1.10)
GFR calc Af Amer: >90 mL/min (ref >90)
GFR calc non Af Amer: 81 mL/min — ABNORMAL LOW (ref >90)
Glucose, Bld: 82 mg/dL (ref 70–99)
Potassium: 4.1 mEq/L (ref 3.5–5.1)
Total Bilirubin: 0.1 mg/dL — ABNORMAL LOW (ref 0.3–1.2)

## 2012-07-15 LAB — ETHANOL: Alcohol, Ethyl (B): <11 mg/dL (ref 0–11)

## 2012-07-15 MED ORDER — NALOXONE HCL 0.4 MG/ML IJ SOLN
0.4000 mg | Freq: Once | INTRAMUSCULAR | Status: AC
  Administered 2012-07-15: 0.4 mg via INTRAVENOUS
  Filled 2012-07-15: qty 1

## 2012-07-15 NOTE — ED Notes (Signed)
Pt to CT via wheelchair

## 2012-07-15 NOTE — ED Notes (Signed)
Patient transported to CT 

## 2012-07-15 NOTE — ED Provider Notes (Signed)
Medical screening examination/treatment/procedure(s) were performed by non-physician practitioner and as supervising physician I was immediately available for consultation/collaboration.  Jasmine Awe, MD 07/15/12 [phone]

## 2012-07-15 NOTE — BH Assessment (Signed)
BHH Assessment Progress Note   At the request of the PA, Kyla Balzarine, this clinician called [facility] to see if they would consider taking patient back.  Arlys John at [facility] said that they would not at this time.  This clinician called [facility] to see if they had any female beds and they do not.  Clinician talked to patient and she said that her last use of illicit drugs was on Sunday.  She said that she did not need detox anymore.  Clinically, this is correct.  Patient does test positive for benzos and cocaine on her UDS.  Patient is irate about her situation and wishes to leave.  She denies any SI, HI or A/V hallucinations at this time.

## 2012-07-15 NOTE — ED Notes (Signed)
Pt requested to go outside to smoke. Was told by this RN that she could not leave hospital to smoke since [facility] was a tobacco free campus. Informed pt that she would get a nicotine patch when she went to [facility]. Pt requested something to eat and drink. This RN brought pt a [place] sandwich meal and two apple juices to drink per pt request. PA then ordered Narcan Im to reverse opoid induced altered mental status. Informed pt that PA ordered Narcan for her. Pt became agitated and initially refused th narcan. Stated that she did not need it and wanted to talk to her mom first. This RN gave pt portable phone to call mom and pt received mom's voice mail. Pt then stated that she would receive the shot by other RN if this RN was not in the room. Pt stated that this RN was "rude and disrespectful of her since her arrival". Pt then called her mom at nurses' station to inform her that she "fired this nurse" and that she was going to "file a grievance and have her fired". This RN called mom after pt hung up with mom to inform her of events leading up to pt's agitation. Pt's mom informed this RN that she "was familiar with this behavior from her daughter". Mom stated that, "it doesn't take much to piss her off" and for RN not to worry about her daughter's behavior.

## 2012-07-15 NOTE — ED Notes (Addendum)
Patient informed that [facility] will not accept her back because of positive UDS.  Patient became very upset demanding her belongings and fussing at [facility] and staff.  Patient notified of the rules and asked to go back to her room.  Plan for patient is to be discharged home.

## 2012-07-15 NOTE — ED Provider Notes (Signed)
History     CSN: [id number]  Arrival date & time 07/14/12  2119   First MD Initiated Contact with Patient 07/14/12 2353      No chief complaint on file.   (Consider location/radiation/quality/duration/timing/severity/associated sxs/prior treatment) HPI Gail Castro is a 32 y.o. female who presents to ED from [facility] rehab facility with complaint of being somnolent, off balance, slurred speech. States that she is in detox, has been there for 4 days. Went in for detox off cocaine, heroin, opioids. States has not used anything since being admitted. States she was given librium upon admission. States also has been off of her regular medications which includes xanax, prozac, trazadone, interestingly, pt told pharmacy that she is still taking her prozac. Pt states she is feeling fine and thinks she is tired and withdrawing and that is why off balance. Pt states "others sneak in drugs and take them at the detox, but I did not do that." Pt denies any medical problems, denies head injury, no current complaints.   Past Medical History  Diagnosis Date  . Bipolar 1 disorder   . Anxiety     Past Surgical History  Procedure Laterality Date  . Eye surgery    . Arm    . Tubal ligation      No family history on file.  History  Substance Use Topics  . Smoking status: Current Every Day Smoker -- 1.00 packs/day  . Smokeless tobacco: Not on file  . Alcohol Use: No    OB History   Grav Para Term Preterm Abortions TAB SAB Ect Mult Living                  Review of Systems  Constitutional: Negative for fever and chills.  HENT: Negative for neck pain and neck stiffness.   Eyes: Negative for visual disturbance.  Respiratory: Negative.   Cardiovascular: Negative.   Neurological: Positive for dizziness. Negative for weakness, numbness and headaches.  All other systems reviewed and are negative.    Allergies  Review of patient's allergies indicates no known allergies.  Home Medications    Current Outpatient Rx  Name  Route  Sig  Dispense  Refill  . ALPRAZolam (XANAX) 1 MG tablet   Oral   Take 1 mg by mouth 4 (four) times daily.         . diazepam (VALIUM) 10 MG tablet   Oral   Take 10 mg by mouth every 6 (six) hours as needed for anxiety. For anxiety         . FLUoxetine (PROZAC) 20 MG capsule   Oral   Take 20 mg by mouth 2 (two) times daily.         . traZODone (DESYREL) 100 MG tablet   Oral   Take 100-200 mg by mouth at bedtime.           BP 98/59  Pulse 57  Temp(Src) 97 F (36.1 C) (Oral)  Resp 14  Physical Exam  Nursing note and vitals reviewed. Constitutional: She is oriented to person, place, and time. She appears well-developed and well-nourished. No distress.  HENT:  Head: Normocephalic.  Eyes: Conjunctivae and EOM are normal. Pupils are equal, round, and reactive to light.  Neck: Normal range of motion. Neck supple.  Cardiovascular: Normal rate, regular rhythm and normal heart sounds.   Pulmonary/Chest: Effort normal and breath sounds normal. No respiratory distress. She has no wheezes. She has no rales.  Neurological: She is alert and oriented  to person, place, and time.  5/5 and equal upper and lower extremity strength bilaterally. Equal grip strength bilaterally. Past pointing on finger to nose. No pronator drift. Patellar reflexes 2+. Pt appears solmnolent, poor coordination. Appears under influence. Speech slow.   Skin: Skin is warm and dry.  Psychiatric: She has a normal mood and affect.    ED Course  Procedures (including critical care time)  Results for orders placed during the hospital encounter of 07/14/12  URINALYSIS, ROUTINE W REFLEX MICROSCOPIC      Result Value Range   Color, Urine YELLOW  YELLOW   APPearance CLEAR  CLEAR   Specific Gravity, Urine 1.009  1.005 - 1.030   pH 5.5  5.0 - 8.0   Glucose, UA NEGATIVE  NEGATIVE mg/dL   Hgb urine dipstick NEGATIVE  NEGATIVE   Bilirubin Urine NEGATIVE  NEGATIVE   Ketones,  ur NEGATIVE  NEGATIVE mg/dL   Protein, ur NEGATIVE  NEGATIVE mg/dL   Urobilinogen, UA 0.2  0.0 - 1.0 mg/dL   Nitrite NEGATIVE  NEGATIVE   Leukocytes, UA SMALL (*) NEGATIVE  CBC WITH DIFFERENTIAL      Result Value Range   WBC 7.3  4.0 - 10.5 K/uL   RBC 4.05  3.87 - 5.11 MIL/uL   Hemoglobin 11.7 (*) 12.0 - 15.0 g/dL   HCT 16.1 (*) 09.6 - 04.5 %   MCV 87.7  78.0 - 100.0 fL   MCH 28.9  26.0 - 34.0 pg   MCHC 33.0  30.0 - 36.0 g/dL   RDW 40.9  81.1 - 91.4 %   Platelets 219  150 - 400 K/uL   Neutrophils Relative 44  43 - 77 %   Neutro Abs 3.2  1.7 - 7.7 K/uL   Lymphocytes Relative 45  12 - 46 %   Lymphs Abs 3.3  0.7 - 4.0 K/uL   Monocytes Relative 5  3 - 12 %   Monocytes Absolute 0.4  0.1 - 1.0 K/uL   Eosinophils Relative 4  0 - 5 %   Eosinophils Absolute 0.3  0.0 - 0.7 K/uL   Basophils Relative 1  0 - 1 %   Basophils Absolute 0.1  0.0 - 0.1 K/uL  COMPREHENSIVE METABOLIC PANEL      Result Value Range   Sodium 141  135 - 145 mEq/L   Potassium 4.1  3.5 - 5.1 mEq/L   Chloride 105  96 - 112 mEq/L   CO2 29  19 - 32 mEq/L   Glucose, Bld 82  70 - 99 mg/dL   BUN 11  6 - 23 mg/dL   Creatinine, Ser 7.82  0.50 - 1.10 mg/dL   Calcium 9.3  8.4 - 95.6 mg/dL   Total Protein 6.5  6.0 - 8.3 g/dL   Albumin 3.4 (*) 3.5 - 5.2 g/dL   AST 22  0 - 37 U/L   ALT 18  0 - 35 U/L   Alkaline Phosphatase 56  39 - 117 U/L   Total Bilirubin 0.1 (*) 0.3 - 1.2 mg/dL   GFR calc non Af Amer 81 (*) >90 mL/min   GFR calc Af Amer >90  >90 mL/min  URINE RAPID DRUG SCREEN (HOSP PERFORMED)      Result Value Range   Opiates NONE DETECTED  NONE DETECTED   Cocaine POSITIVE (*) NONE DETECTED   Benzodiazepines POSITIVE (*) NONE DETECTED   Amphetamines NONE DETECTED  NONE DETECTED   Tetrahydrocannabinol NONE DETECTED  NONE DETECTED  Barbiturates NONE DETECTED  NONE DETECTED  URINE MICROSCOPIC-ADD ON      Result Value Range   Squamous Epithelial / LPF RARE  RARE   WBC, UA 3-6  <3 WBC/hpf   RBC / HPF 0-2  <3 RBC/hpf    Bacteria, UA RARE  RARE  ETHANOL      Result Value Range   Alcohol, Ethyl (B) <11  0 - 11 mg/dL  POCT PREGNANCY, URINE      Result Value Range   Preg Test, Ur NEGATIVE  NEGATIVE   Ct Head Wo Contrast  07/15/2012  *RADIOLOGY REPORT*  Clinical Data: 32 year old female with altered mental status, abnormal behavior.  CT HEAD WITHOUT CONTRAST  Technique:  Contiguous axial images were obtained from the base of the skull through the vertex without contrast.  Comparison: 10/11/2010.  Findings: Chronic right orbital floor fracture re-identified. Visualized paranasal sinuses and mastoids are clear.  Visualized orbit soft tissues are within normal limits.  No acute scalp soft tissue findings. No acute osseous abnormality identified.  Stable and normal cerebral volume. No midline shift, ventriculomegaly, mass effect, evidence of mass lesion, intracranial hemorrhage or evidence of cortically based acute infarction.  Gray-white matter differentiation is within normal limits throughout the brain.  No suspicious intracranial vascular hyperdensity.  IMPRESSION: Normal noncontrast CT appearance of the brain.   Original Report Authenticated By: Erskine Speed, M.D.     2:48 AM Pt medically cleared. Drug screen continues to be positive for cocaine and benzos. Pt denies any since 5 days ago. Based on the exam, suspect pt continues to use at the detox facility. We spoke with [facility], they are refusing to accept her back. Pt technically already gone through detox and has been detoxed if she has not used in last 5 days, explained this to her and that she does not meet criteria for further detox placement. Pt will be d/c home. She denies si or hi. Stable for discharge.   1. Polysubstance abuse       MDM  PT from [facility] for medical clearance, given pt's poor coordination, slurred speech, balance problems. Pt in no distress, she stats she is fine, denies continued use of benzos or cocaine, however it is still positive on UDS.  Pt has been medically cleared. Suspect continued use. Pt not accepted back at [facility], no beds at [facility]. Will d/c home with outpatient treatments.   Filed Vitals:   07/14/12 2126 07/15/12 0050  BP: 98/51 98/59  Pulse: 68 57  Temp: 98.5 F (36.9 C) 97 F (36.1 C)  TempSrc: Oral Oral  Resp: 14           Jb Dulworth A Anjulie Dipierro, PA-C 07/15/12 0507

## 2012-08-23 ENCOUNTER — Encounter (HOSPITAL_COMMUNITY): Payer: Self-pay | Admitting: Emergency Medicine

## 2012-08-23 ENCOUNTER — Emergency Department (HOSPITAL_COMMUNITY)
Admission: EM | Admit: 2012-08-23 | Discharge: 2012-08-24 | Disposition: A | Discharge status: Home / Self Care | Payer: Self-pay | Attending: Emergency Medicine | Admitting: Emergency Medicine

## 2012-08-23 DIAGNOSIS — F17200 Nicotine dependence, unspecified, uncomplicated: Secondary | ICD-10-CM | POA: Insufficient documentation

## 2012-08-23 DIAGNOSIS — F1910 Other psychoactive substance abuse, uncomplicated: Secondary | ICD-10-CM

## 2012-08-23 DIAGNOSIS — Z3202 Encounter for pregnancy test, result negative: Secondary | ICD-10-CM | POA: Insufficient documentation

## 2012-08-23 DIAGNOSIS — F411 Generalized anxiety disorder: Secondary | ICD-10-CM | POA: Insufficient documentation

## 2012-08-23 DIAGNOSIS — F1210 Cannabis abuse, uncomplicated: Secondary | ICD-10-CM | POA: Insufficient documentation

## 2012-08-23 DIAGNOSIS — Z79899 Other long term (current) drug therapy: Secondary | ICD-10-CM | POA: Insufficient documentation

## 2012-08-23 DIAGNOSIS — F1110 Opioid abuse, uncomplicated: Secondary | ICD-10-CM | POA: Insufficient documentation

## 2012-08-23 DIAGNOSIS — F319 Bipolar disorder, unspecified: Secondary | ICD-10-CM | POA: Insufficient documentation

## 2012-08-23 DIAGNOSIS — F1410 Cocaine abuse, uncomplicated: Secondary | ICD-10-CM | POA: Insufficient documentation

## 2012-08-23 LAB — HEPATIC FUNCTION PANEL
ALT: 25 U/L (ref 0–35)
Alkaline Phosphatase: 59 U/L (ref 39–117)
Bilirubin, Direct: <0.1 mg/dL (ref 0.0–0.3)

## 2012-08-23 LAB — CBC WITH DIFFERENTIAL/PLATELET
Basophils Absolute: 0.1 10*3/uL (ref 0.0–0.1)
Basophils Relative: 1 % (ref 0–1)
Eosinophils Absolute: 0.1 10*3/uL (ref 0.0–0.7)
Hemoglobin: 13 g/dL (ref 12.0–15.0)
MCH: 28.4 pg (ref 26.0–34.0)
MCHC: 32.2 g/dL (ref 30.0–36.0)
Monocytes Relative: 4 % (ref 3–12)
Neutro Abs: 4.6 10*3/uL (ref 1.7–7.7)
Neutrophils Relative %: 73 % (ref 43–77)
RDW: 14.9 % (ref 11.5–15.5)

## 2012-08-23 LAB — ETHANOL: Alcohol, Ethyl (B): <11 mg/dL (ref 0–11)

## 2012-08-23 LAB — URINALYSIS, ROUTINE W REFLEX MICROSCOPIC
Glucose, UA: NEGATIVE mg/dL
Hgb urine dipstick: NEGATIVE
Leukocytes, UA: NEGATIVE
pH: 6 (ref 5.0–8.0)

## 2012-08-23 LAB — BASIC METABOLIC PANEL
BUN: 9 mg/dL (ref 6–23)
CO2: 29 mEq/L (ref 19–32)
Chloride: 102 mEq/L (ref 96–112)
Creatinine, Ser: 0.83 mg/dL (ref 0.50–1.10)
Glucose, Bld: 225 mg/dL — ABNORMAL HIGH (ref 70–99)

## 2012-08-23 LAB — RAPID URINE DRUG SCREEN, HOSP PERFORMED
Amphetamines: NOT DETECTED
Benzodiazepines: NOT DETECTED

## 2012-08-23 LAB — PREGNANCY, URINE: Preg Test, Ur: NEGATIVE

## 2012-08-23 MED ORDER — FLUOXETINE HCL 20 MG PO CAPS
40.0000 mg | ORAL_CAPSULE | Freq: Two times a day (BID) | ORAL | Status: DC
  Administered 2012-08-23: 40 mg via ORAL
  Filled 2012-08-23 (×5): qty 2

## 2012-08-23 MED ORDER — ACETAMINOPHEN 325 MG PO TABS
650.0000 mg | ORAL_TABLET | ORAL | Status: DC | PRN
  Administered 2012-08-24: 650 mg via ORAL
  Filled 2012-08-23: qty 2

## 2012-08-23 MED ORDER — CLONIDINE HCL 0.1 MG PO TABS
0.1000 mg | ORAL_TABLET | Freq: Four times a day (QID) | ORAL | Status: DC
  Administered 2012-08-23 – 2012-08-24 (×2): 0.1 mg via ORAL
  Filled 2012-08-23 (×3): qty 1

## 2012-08-23 MED ORDER — LOPERAMIDE HCL 2 MG PO CAPS: 2.0000 mg | ORAL_CAPSULE | ORAL | Status: DC | PRN

## 2012-08-23 MED ORDER — TRAZODONE HCL 50 MG PO TABS
100.0000 mg | ORAL_TABLET | Freq: Every day | ORAL | Status: DC
  Administered 2012-08-23: 100 mg via ORAL
  Filled 2012-08-23 (×2): qty 1
  Filled 2012-08-23: qty 2

## 2012-08-23 MED ORDER — FLUOXETINE HCL 20 MG PO CAPS
ORAL_CAPSULE | ORAL | Status: AC
  Filled 2012-08-23: qty 2

## 2012-08-23 MED ORDER — HYDROXYZINE HCL 25 MG PO TABS: 25.0000 mg | ORAL_TABLET | Freq: Four times a day (QID) | ORAL | Status: DC | PRN

## 2012-08-23 MED ORDER — NAPROXEN 250 MG PO TABS: 500.0000 mg | ORAL_TABLET | Freq: Two times a day (BID) | ORAL | Status: DC | PRN

## 2012-08-23 MED ORDER — DICYCLOMINE HCL 20 MG PO TABS
20.0000 mg | ORAL_TABLET | Freq: Four times a day (QID) | ORAL | Status: DC | PRN
  Filled 2012-08-23: qty 1

## 2012-08-23 MED ORDER — CLONIDINE HCL 0.1 MG PO TABS: 0.1000 mg | ORAL_TABLET | Freq: Every day | ORAL | Status: DC

## 2012-08-23 MED ORDER — METHOCARBAMOL 500 MG PO TABS
500.0000 mg | ORAL_TABLET | Freq: Three times a day (TID) | ORAL | Status: DC | PRN
  Administered 2012-08-23 – 2012-08-24 (×2): 500 mg via ORAL
  Filled 2012-08-23 (×2): qty 1

## 2012-08-23 MED ORDER — ALPRAZOLAM 0.5 MG PO TABS
1.0000 mg | ORAL_TABLET | Freq: Three times a day (TID) | ORAL | Status: DC | PRN
  Administered 2012-08-23 – 2012-08-24 (×2): 1 mg via ORAL
  Filled 2012-08-23 (×2): qty 2

## 2012-08-23 MED ORDER — NICOTINE 21 MG/24HR TD PT24
21.0000 mg | MEDICATED_PATCH | Freq: Every day | TRANSDERMAL | Status: DC
  Administered 2012-08-23 – 2012-08-24 (×2): 21 mg via TRANSDERMAL
  Filled 2012-08-23 (×2): qty 1

## 2012-08-23 MED ORDER — TRAZODONE HCL 50 MG PO TABS
ORAL_TABLET | ORAL | Status: AC
  Filled 2012-08-23: qty 2

## 2012-08-23 MED ORDER — IBUPROFEN 400 MG PO TABS: 600.0000 mg | ORAL_TABLET | Freq: Three times a day (TID) | ORAL | Status: DC | PRN

## 2012-08-23 MED ORDER — CLONIDINE HCL 0.1 MG PO TABS: 0.1000 mg | ORAL_TABLET | ORAL | Status: DC

## 2012-08-23 MED ORDER — TRAZODONE HCL 100 MG PO TABS: 100.0000 mg | ORAL_TABLET | Freq: Every day | ORAL | Status: DC

## 2012-08-23 MED ORDER — ONDANSETRON HCL 4 MG PO TABS: 4.0000 mg | ORAL_TABLET | Freq: Three times a day (TID) | ORAL | Status: DC | PRN

## 2012-08-23 MED ORDER — ONDANSETRON 4 MG PO TBDP
4.0000 mg | ORAL_TABLET | Freq: Four times a day (QID) | ORAL | Status: DC | PRN
  Filled 2012-08-23: qty 1

## 2012-08-23 NOTE — ED Notes (Signed)
ACT at bedside 

## 2012-08-23 NOTE — ED Notes (Signed)
Pt sent to ED from [facility] for medical detox. Pt states she needs detox from heroin (last use $20 worth at 11am today) and cocaine (last use $20 worth at 0200 this am). Pt states she also uses marijuana. Pt has letter from [facility] counselor stating Floydene Flock tried tp admit pt to [facility] for their detox screening but after phone interview [facility] recommended pt go to ED.

## 2012-08-23 NOTE — ED Provider Notes (Signed)
History  This chart was scribed for [employer]. Rubin Payor, MD by Bennett Scrape, ED Scribe. This patient was seen in room APA16A/APA16A and the patient's care was started at 4:39 PM.  CSN: [id number]  Arrival date & time 08/23/12  1624   First MD Initiated Contact with Patient 08/23/12 1639      Chief Complaint  Patient presents with  . Medical Clearance  . Addiction Problem     The history is provided by the patient. No language interpreter was used.    HPI Comments: Gail Castro is a 32 y.o. female with a h/o anxiety and bipolar disorder who presents to the Emergency Department requesting detox from heroin and cocaine. Pt reports that she has been using 20 to 100 dollars of heroin daily for the past and 20 to 200 dollars worth of cocaine daily for the past 2 months. She last used heroin around 11 AM today and last used cocaine around 2 AM. She admits that she has been injecting the heroin into her left antecubital region only. She denies injecting anywhere else. Pt states that she wants detox, because she is currently enrolled in insight at [facility] and relapsed 2 months ago. She reports that the longest amount of time she's been sober is 7 weeks when she was going to school. She denies cough and SOB as associated symptoms. Pt is a current everyday smoker and occasional alcohol user.  Past Medical History  Diagnosis Date  . Bipolar 1 disorder   . Anxiety     Past Surgical History  Procedure Laterality Date  . Eye surgery    . Arm    . Tubal ligation      No family history on file.  History  Substance Use Topics  . Smoking status: Current Every Day Smoker -- 1.00 packs/day  . Smokeless tobacco: Not on file  . Alcohol Use: Yes    No OB history provided.  Review of Systems  Constitutional: Negative for fever.  Respiratory: Negative for cough and shortness of breath.   Gastrointestinal: Negative for vomiting and diarrhea.  All other systems reviewed and are  negative.    Allergies  Review of patient's allergies indicates no known allergies.  Home Medications   Current Outpatient Rx  Name  Route  Sig  Dispense  Refill  . ALPRAZolam (XANAX) 1 MG tablet   Oral   Take 1 mg by mouth 3 (three) times daily.          Marland Kitchen FLUoxetine (PROZAC) 20 MG capsule   Oral   Take 40 mg by mouth 2 (two) times daily.          . traZODone (DESYREL) 100 MG tablet   Oral   Take 100-200 mg by mouth at bedtime.           Triage Vitals: BP 106/59  Pulse 77  Temp(Src) 98.1 F (36.7 C) (Oral)  Resp 18  Ht 5\' 9"  (1.753 m)  Wt 136 lb 3 oz (61.774 kg)  BMI 20.1 kg/m2  SpO2 99%  LMP 07/23/2012  Physical Exam  Nursing note and vitals reviewed. Constitutional: She is oriented to person, place, and time. She appears well-developed and well-nourished. No distress.  HENT:  Head: Normocephalic and atraumatic.  Eyes: Conjunctivae and EOM are normal.  Neck: Neck supple. No tracheal deviation present.  Cardiovascular: Normal rate and regular rhythm.   Pulmonary/Chest: Effort normal and breath sounds normal. No respiratory distress.  Abdominal: Soft. There is no tenderness.  Musculoskeletal: Normal range of motion.  Neurological: She is alert and oriented to person, place, and time.  Skin: Skin is warm and dry.  Track marks along left antecubital   Psychiatric: She has a normal mood and affect. Her behavior is normal.    ED Course  Procedures (including critical care time)  DIAGNOSTIC STUDIES: Oxygen Saturation is 99% on room air, normal by my interpretation.    COORDINATION OF CARE: 4:55 PM-Discussed treatment plan which includes CBC panel, CMP and UA with pt at bedside and pt agreed to plan. Advised pt of withdrawal symptoms.  Labs Reviewed  BASIC METABOLIC PANEL - Abnormal; Notable for the following:    Glucose, Bld 225 (*)    All other components within normal limits  URINALYSIS, ROUTINE W REFLEX MICROSCOPIC - Abnormal; Notable for the  following:    APPearance HAZY (*)    Bilirubin Urine SMALL (*)    Ketones, ur TRACE (*)    All other components within normal limits  URINE RAPID DRUG SCREEN (HOSP PERFORMED) - Abnormal; Notable for the following:    Opiates POSITIVE (*)    Cocaine POSITIVE (*)    Tetrahydrocannabinol POSITIVE (*)    All other components within normal limits  HEPATIC FUNCTION PANEL - Abnormal; Notable for the following:    Albumin 3.2 (*)    All other components within normal limits  ETHANOL  CBC WITH DIFFERENTIAL  PREGNANCY, URINE   No results found.   1. Substance abuse       MDM  Patient requests treatment for opiate and cocaine abuse. Lab work is reassuring. She is not suicidal. She's been seen by ACT team.  I personally performed the services described in this documentation, which was scribed in my presence. The recorded information has been reviewed and is accurate.       Wasn't a  Juliet Rude. Rubin Payor, MD 08/23/12 [phone]

## 2012-08-23 NOTE — BH Assessment (Signed)
Assessment Note   Gail Castro is an 32 y.o. female. Pt reported to the er seeking detox [facility].  She reports her counselor, Thurston Hole of [facility], as made contact recently with [facility] and was told to sent the pt for medical clearance. Pt reports she was sober for 7 weeks and relapsed 2 months ago. She is currently shooting up about 20-100.00 worth of heroin daily, and 20 to 200.00 worth of cocaine daily.  Pt reports by being enrolled in the [facility] at [facility] she is wanting to be succesful and stop using everything. She denies S/I, H/i, and is not psychotic.Pt has transportation to get to a detox facility.  Axis I: Polysubstance Dependency Axis II: Deferred Axis III:  Past Medical History  Diagnosis Date  . Bipolar 1 disorder   . Anxiety    Axis IV: housing problems, other psychosocial or environmental problems, problems related to social environment and problems with access to health care services Axis V: 51-60 moderate symptoms      Past Medical History:  Past Medical History  Diagnosis Date  . Bipolar 1 disorder   . Anxiety     Past Surgical History  Procedure Laterality Date  . Eye surgery    . Arm    . Tubal ligation      Family History: No family history on file.  Social History:  reports that she has been smoking.  She does not have any smokeless tobacco history on file. She reports that  drinks alcohol. She reports that she uses illicit drugs (Cocaine and Marijuana).  Additional Social History:  Alcohol / Drug Use Pain Medications: yes Prescriptions: na Over the Counter: na History of alcohol / drug use?: Yes Substance #1 Name of Substance 1: marijuana 1 - Age of First Use: 16 1 - Amount (size/oz): ocassional  1 - Frequency: 1-2 x every 2-3 months 1 - Duration: years 1 - Last Use / Amount: smoked a blunt yesterday and today, before then several months ago Substance #2 Name of Substance 2: heroin 2 - Age of First Use: 22 2 - Amount  (size/oz): 100-200 worth 2 - Frequency: daily by injection 2 - Duration: 2 months 2 - Last Use / Amount: today 20.00 worth Substance #3 Name of Substance 3: cocaine 3 - Age of First Use: 19 3 - Amount (size/oz): 20.00 to 100.00 3 - Frequency: daily 3 - Duration: 2 months 3 - Last Use / Amount: today  CIWA: CIWA-Ar BP: 106/59 mmHg Pulse Rate: 77 COWS: Clinical Opiate Withdrawal Scale (COWS) Resting Pulse Rate: Pulse Rate 81-100 Sweating: Subjective report of chills or flushing Restlessness: Frequent shifting or extraneous movements of legs/arms Pupil Size: Pupils pinned or normal size for room light Bone or Joint Aches: Patient is rubbing joints or muscles and is unable to sit still because of discomfort Runny Nose or Tearing: Nasal stuffiness or unusually moist eyes GI Upset: Stomach cramps Tremor: Tremor can be felt, but not observed Yawning: Yawning once or twice during assessment Anxiety or Irritability: Patient reports increasing irritability or anxiousness Gooseflesh Skin: Skin is smooth COWS Total Score: 14  Allergies: No Known Allergies  Home Medications:  (Not in a hospital admission)  OB/GYN Status:  Patient's last menstrual period was 07/23/2012.  General Assessment Data Location of Assessment: [facility] Assessment: Yes Living Arrangements: Non-relatives/Friends Can pt return to current living arrangement?: Yes Admission Status: Voluntary Is patient capable of signing voluntary admission?: Yes Transfer from: Home Referral Source:  MD  Education Status Contact person: Renee-[phone]  Risk to self Suicidal Ideation: No Suicidal Intent: No Is patient at risk for suicide?: No Suicidal Plan?: No Access to Means: No What has been your use of drugs/alcohol within the last 12 months?: heroin,marijuana,cocaine Previous Attempts/Gestures: No How many times?: 0 Other Self Harm Risks: 0 Triggers for Past Attempts: None known Intentional Self  Injurious Behavior: None Family Suicide History: No Recent stressful life event(s): Financial Problems Persecutory voices/beliefs?: No Depression: Yes Depression Symptoms: Feeling worthless/self pity;Insomnia Substance abuse history and/or treatment for substance abuse?: Yes Suicide prevention information given to non-admitted patients: Not applicable  Risk to Others Homicidal Ideation: No Thoughts of Harm to Others: No Current Homicidal Intent: No Current Homicidal Plan: No Access to Homicidal Means: No Identified Victim: na History of harm to others?: No Assessment of Violence: None Noted Violent Behavior Description: na Does patient have access to weapons?: No Criminal Charges Pending?: No Does patient have a court date: No  Psychosis Hallucinations: None noted Delusions: None noted  Mental Status Report Appear/Hygiene: Improved Eye Contact: Good Motor Activity: Freedom of movement Speech: Logical/coherent Level of Consciousness: Alert Mood: Depressed;Guilty;Sad;Despair Affect: Appropriate to circumstance;Sad;Depressed Anxiety Level: Moderate Thought Processes: Coherent;Relevant Judgement: Unimpaired Orientation: Person;Place;Time;Situation Obsessive Compulsive Thoughts/Behaviors: None  Cognitive Functioning Concentration: Normal Memory: Recent Intact;Remote Intact IQ: Average Insight: Poor Impulse Control: Poor Appetite: Poor Weight Loss: 15 Weight Gain: 0 Sleep: No Change Total Hours of Sleep: 6 Vegetative Symptoms: None  ADLScreening [facility] Assessment Services) Patient's cognitive ability adequate to safely complete daily activities?: Yes Patient able to express need for assistance with ADLs?: Yes Independently performs ADLs?: Yes (appropriate for developmental age)  Abuse/Neglect [facility]) Physical Abuse: Denies Verbal Abuse: Denies Sexual Abuse: Denies  Prior Inpatient Therapy Prior Inpatient Therapy: Yes Prior Therapy Dates: 7 weeks ago Prior  Therapy Facilty/Provider(s): [facility] Reason for Treatment: detox  Prior Outpatient Therapy Prior Outpatient Therapy: Yes Prior Therapy Dates: currently Prior Therapy Facilty/Provider(s): [facility] Reason for Treatment: substance abuse  ADL Screening (condition at time of admission) Patient's cognitive ability adequate to safely complete daily activities?: Yes Patient able to express need for assistance with ADLs?: Yes Independently performs ADLs?: Yes (appropriate for developmental age) Weakness of Legs: None     Therapy Consults (therapy consults require a physician order) PT Evaluation Needed: No OT Evalulation Needed: No SLP Evaluation Needed: No Abuse/Neglect Assessment (Assessment to be complete while patient is alone) Physical Abuse: Denies Verbal Abuse: Denies Sexual Abuse: Denies Exploitation of patient/patient's resources: Denies Self-Neglect: Denies Values / Beliefs Cultural Requests During Hospitalization: None Spiritual Requests During Hospitalization: None Consults Spiritual Care Consult Needed: No Social Work Consult Needed: No Merchant navy officer (For Healthcare) Advance Directive: Patient does not have advance directive;Patient would not like information Pre-existing out of facility DNR order (yellow form or pink MOST form): No    Additional Information 1:1 In Past 12 Months?: No CIRT Risk: No Elopement Risk: No Does patient have medical clearance?: Yes     Disposition: Dr Rubin Payor Disposition Disposition of Patient: Referred to Patient referred to: [facility]  On Site Evaluation by:   Reviewed with Physician:     Hattie Perch Winford 08/23/2012 7:45 PM

## 2012-08-23 NOTE — ED Notes (Signed)
Spoke with Rhetta Mura on the phone from [facility]. Was informed that the patient was to be reviewed by administration tomorrow before either accepting or denying placement.

## 2012-08-23 NOTE — ED Notes (Signed)
Pt given snack and a drink. States her muscle cramping has subsided.

## 2012-08-24 MED ORDER — FLUOXETINE HCL 20 MG PO CAPS
20.0000 mg | ORAL_CAPSULE | Freq: Two times a day (BID) | ORAL | Status: DC
  Administered 2012-08-24: 20 mg via ORAL
  Filled 2012-08-24 (×5): qty 1

## 2012-08-24 MED ORDER — TRAZODONE HCL 50 MG PO TABS
ORAL_TABLET | ORAL | Status: AC
  Filled 2012-08-24: qty 2

## 2012-08-24 MED ORDER — FLUOXETINE HCL 20 MG PO CAPS
ORAL_CAPSULE | ORAL | Status: AC
  Filled 2012-08-24: qty 1

## 2012-08-24 MED ORDER — DICYCLOMINE HCL 10 MG PO CAPS: 20.0000 mg | ORAL_CAPSULE | Freq: Four times a day (QID) | ORAL | Status: DC | PRN

## 2012-08-24 NOTE — ED Notes (Signed)
Pt's boyfriend Chesley Mires: [phone]

## 2012-08-24 NOTE — ED Provider Notes (Signed)
Patient has been accepted by [facility]. She denies any suicidal or homicidal thoughts.  BP 89/51  Pulse 55  Temp(Src) 98.2 F (36.8 C) (Oral)  Resp 17  Ht 5\' 9"  (1.753 m)  Wt 136 lb 3 oz (61.774 kg)  BMI 20.1 kg/m2  SpO2 100%  LMP 07/23/2012   Glynn Octave, MD 08/24/12 1926

## 2012-08-24 NOTE — ED Notes (Signed)
Sandy, from [facility] called and stated she did find the paper work and they will accept the patient but she cannot take her until 8 pm or pm. States they do not need an authorization. Pt and family aware.

## 2012-08-24 NOTE — Progress Notes (Addendum)
Pt accepted at [facility]  By [facility]. Called [facility] spoke with Avon Gully who authorized 3 days at [facility] [phone].  Called Sandy at [facility] and gave [facility] authorization number. Dr Manus Gunning agrees with disposition and will discharge pt to go to [facility].

## 2012-08-24 NOTE — ED Notes (Signed)
Per Tommy with [facility] - pt has been referred to [facility], still pending at [facility].  Waiting on call back.

## 2012-08-24 NOTE — BHH Counselor (Signed)
Spoke with staff at [facility], patient has not been stasffed at this time. They will call when this is completed.  Also completed referral to [facility] for Detox. Awaiting decision on admission. Dr Alwyn Pea informed af progress toward dertox placement.

## 2012-08-24 NOTE — ED Notes (Signed)
Called [facility] regarding acceptance of pt. Staff member states she came on at 3pm and she has not seen any paper work on the pt. States she will look around and call nurse back

## 2012-08-24 NOTE — ED Notes (Signed)
Pt offered shower, refusing at this time.

## 2012-08-24 NOTE — ED Notes (Signed)
Spoke with Olegario Messier from [facility] - pt was denied for detox.

## 2012-08-24 NOTE — ED Notes (Signed)
Patient states that she may leave if she does not get placed soon.

## 2012-09-07 ENCOUNTER — Emergency Department (HOSPITAL_COMMUNITY): Payer: Self-pay

## 2012-09-07 ENCOUNTER — Encounter (HOSPITAL_COMMUNITY): Payer: Self-pay | Admitting: *Deleted

## 2012-09-07 ENCOUNTER — Inpatient Hospital Stay (HOSPITAL_COMMUNITY)
Admission: EM | Admit: 2012-09-07 | Discharge: 2012-09-10 | DRG: 603 | Disposition: A | Discharge status: Home / Self Care | Payer: Self-pay | Attending: Internal Medicine | Admitting: Internal Medicine

## 2012-09-07 DIAGNOSIS — F1120 Opioid dependence, uncomplicated: Secondary | ICD-10-CM | POA: Diagnosis present

## 2012-09-07 DIAGNOSIS — Z23 Encounter for immunization: Secondary | ICD-10-CM

## 2012-09-07 DIAGNOSIS — H5789 Other specified disorders of eye and adnexa: Secondary | ICD-10-CM | POA: Diagnosis present

## 2012-09-07 DIAGNOSIS — Z8614 Personal history of Methicillin resistant Staphylococcus aureus infection: Secondary | ICD-10-CM

## 2012-09-07 DIAGNOSIS — F419 Anxiety disorder, unspecified: Secondary | ICD-10-CM | POA: Diagnosis present

## 2012-09-07 DIAGNOSIS — L0201 Cutaneous abscess of face: Principal | ICD-10-CM | POA: Diagnosis present

## 2012-09-07 DIAGNOSIS — L03211 Cellulitis of face: Principal | ICD-10-CM | POA: Diagnosis present

## 2012-09-07 DIAGNOSIS — F411 Generalized anxiety disorder: Secondary | ICD-10-CM | POA: Diagnosis present

## 2012-09-07 DIAGNOSIS — F1110 Opioid abuse, uncomplicated: Secondary | ICD-10-CM | POA: Diagnosis present

## 2012-09-07 DIAGNOSIS — N390 Urinary tract infection, site not specified: Secondary | ICD-10-CM | POA: Diagnosis present

## 2012-09-07 DIAGNOSIS — Z8249 Family history of ischemic heart disease and other diseases of the circulatory system: Secondary | ICD-10-CM

## 2012-09-07 DIAGNOSIS — F17200 Nicotine dependence, unspecified, uncomplicated: Secondary | ICD-10-CM | POA: Diagnosis present

## 2012-09-07 DIAGNOSIS — H05229 Edema of unspecified orbit: Secondary | ICD-10-CM | POA: Diagnosis present

## 2012-09-07 DIAGNOSIS — L0390 Cellulitis, unspecified: Secondary | ICD-10-CM | POA: Diagnosis present

## 2012-09-07 DIAGNOSIS — F319 Bipolar disorder, unspecified: Secondary | ICD-10-CM | POA: Diagnosis present

## 2012-09-07 DIAGNOSIS — Z720 Tobacco use: Secondary | ICD-10-CM | POA: Diagnosis present

## 2012-09-07 DIAGNOSIS — A4901 Methicillin susceptible Staphylococcus aureus infection, unspecified site: Secondary | ICD-10-CM | POA: Diagnosis present

## 2012-09-07 HISTORY — DX: Methicillin resistant Staphylococcus aureus infection, unspecified site: A49.02

## 2012-09-07 HISTORY — DX: Opioid abuse, uncomplicated: F11.10

## 2012-09-07 HISTORY — DX: Tobacco use: Z72.0

## 2012-09-07 LAB — HIV ANTIBODY (ROUTINE TESTING W REFLEX): HIV: NONREACTIVE

## 2012-09-07 LAB — URINALYSIS, ROUTINE W REFLEX MICROSCOPIC
Bilirubin Urine: NEGATIVE
Glucose, UA: NEGATIVE mg/dL
Ketones, ur: NEGATIVE mg/dL
Protein, ur: NEGATIVE mg/dL
Urobilinogen, UA: 1 mg/dL (ref 0.0–1.0)

## 2012-09-07 LAB — BASIC METABOLIC PANEL
BUN: 9 mg/dL (ref 6–23)
CO2: 33 mEq/L — ABNORMAL HIGH (ref 19–32)
Chloride: 100 mEq/L (ref 96–112)
Glucose, Bld: 104 mg/dL — ABNORMAL HIGH (ref 70–99)
Potassium: 3.8 mEq/L (ref 3.5–5.1)
Sodium: 140 mEq/L (ref 135–145)

## 2012-09-07 LAB — CBC WITH DIFFERENTIAL/PLATELET
Eosinophils Absolute: 0.4 10*3/uL (ref 0.0–0.7)
Hemoglobin: 14 g/dL (ref 12.0–15.0)
Lymphocytes Relative: 22 % (ref 12–46)
Lymphs Abs: 2.1 10*3/uL (ref 0.7–4.0)
MCH: 28.5 pg (ref 26.0–34.0)
Monocytes Relative: 6 % (ref 3–12)
Neutrophils Relative %: 68 % (ref 43–77)
Platelets: 238 10*3/uL (ref 150–400)
RBC: 4.91 MIL/uL (ref 3.87–5.11)
WBC: 9.4 10*3/uL (ref 4.0–10.5)

## 2012-09-07 LAB — URINE MICROSCOPIC-ADD ON

## 2012-09-07 LAB — RAPID URINE DRUG SCREEN, HOSP PERFORMED
Amphetamines: POSITIVE — AB
Barbiturates: NOT DETECTED
Opiates: POSITIVE — AB
Tetrahydrocannabinol: POSITIVE — AB

## 2012-09-07 MED ORDER — DICYCLOMINE HCL 10 MG PO CAPS
20.0000 mg | ORAL_CAPSULE | Freq: Four times a day (QID) | ORAL | Status: DC | PRN
  Filled 2012-09-07: qty 2

## 2012-09-07 MED ORDER — CLONIDINE HCL 0.1 MG PO TABS: 0.1000 mg | ORAL_TABLET | Freq: Every day | ORAL | Status: DC

## 2012-09-07 MED ORDER — HYDROXYZINE HCL 25 MG PO TABS: 25.0000 mg | ORAL_TABLET | Freq: Four times a day (QID) | ORAL | Status: DC | PRN

## 2012-09-07 MED ORDER — CLONIDINE HCL 0.1 MG PO TABS: 0.1000 mg | ORAL_TABLET | ORAL | Status: DC

## 2012-09-07 MED ORDER — ALPRAZOLAM 1 MG PO TABS
1.0000 mg | ORAL_TABLET | Freq: Three times a day (TID) | ORAL | Status: DC
  Administered 2012-09-07 – 2012-09-10 (×9): 1 mg via ORAL
  Filled 2012-09-07 (×2): qty 2
  Filled 2012-09-07 (×2): qty 1
  Filled 2012-09-07 (×4): qty 2
  Filled 2012-09-07: qty 1

## 2012-09-07 MED ORDER — PIPERACILLIN-TAZOBACTAM 3.375 G IVPB
3.3750 g | Freq: Once | INTRAVENOUS | Status: AC
  Administered 2012-09-07: 3.375 g via INTRAVENOUS
  Filled 2012-09-07: qty 50

## 2012-09-07 MED ORDER — HYDROCODONE-ACETAMINOPHEN 5-325 MG PO TABS
1.0000 | ORAL_TABLET | ORAL | Status: DC | PRN
  Administered 2012-09-09 (×5): 1 via ORAL
  Administered 2012-09-10: 2 via ORAL
  Filled 2012-09-07: qty 1
  Filled 2012-09-07: qty 2
  Filled 2012-09-07 (×4): qty 1

## 2012-09-07 MED ORDER — PIPERACILLIN-TAZOBACTAM 3.375 G IVPB
3.3750 g | Freq: Three times a day (TID) | INTRAVENOUS | Status: DC
  Administered 2012-09-07 – 2012-09-10 (×9): 3.375 g via INTRAVENOUS
  Filled 2012-09-07 (×16): qty 50

## 2012-09-07 MED ORDER — VANCOMYCIN HCL IN DEXTROSE 1-5 GM/200ML-% IV SOLN
1000.0000 mg | Freq: Once | INTRAVENOUS | Status: AC
  Administered 2012-09-07: 1000 mg via INTRAVENOUS
  Filled 2012-09-07: qty 200

## 2012-09-07 MED ORDER — ACETAMINOPHEN 325 MG PO TABS: 650.0000 mg | ORAL_TABLET | Freq: Four times a day (QID) | ORAL | Status: DC | PRN

## 2012-09-07 MED ORDER — NICOTINE 21 MG/24HR TD PT24
21.0000 mg | MEDICATED_PATCH | Freq: Every day | TRANSDERMAL | Status: DC
  Administered 2012-09-07 – 2012-09-10 (×4): 21 mg via TRANSDERMAL
  Filled 2012-09-07 (×4): qty 1

## 2012-09-07 MED ORDER — ONDANSETRON HCL 4 MG/2ML IJ SOLN: 4.0000 mg | Freq: Four times a day (QID) | INTRAMUSCULAR | Status: DC | PRN

## 2012-09-07 MED ORDER — ONDANSETRON HCL 4 MG PO TABS: 4.0000 mg | ORAL_TABLET | Freq: Four times a day (QID) | ORAL | Status: DC | PRN

## 2012-09-07 MED ORDER — KETOROLAC TROMETHAMINE 30 MG/ML IJ SOLN
30.0000 mg | Freq: Four times a day (QID) | INTRAMUSCULAR | Status: AC
  Administered 2012-09-07 – 2012-09-09 (×8): 30 mg via INTRAVENOUS
  Filled 2012-09-07 (×8): qty 1

## 2012-09-07 MED ORDER — CLONIDINE HCL 0.1 MG PO TABS
0.1000 mg | ORAL_TABLET | Freq: Four times a day (QID) | ORAL | Status: AC
  Administered 2012-09-08 – 2012-09-09 (×5): 0.1 mg via ORAL
  Filled 2012-09-07 (×7): qty 1

## 2012-09-07 MED ORDER — FLUOXETINE HCL 20 MG PO CAPS
20.0000 mg | ORAL_CAPSULE | Freq: Two times a day (BID) | ORAL | Status: DC
  Administered 2012-09-07 – 2012-09-10 (×6): 20 mg via ORAL
  Filled 2012-09-07 (×6): qty 1

## 2012-09-07 MED ORDER — IOHEXOL 300 MG/ML  SOLN
75.0000 mL | Freq: Once | INTRAMUSCULAR | Status: AC | PRN
  Administered 2012-09-07: 75 mL via INTRAVENOUS

## 2012-09-07 MED ORDER — ALPRAZOLAM 0.5 MG PO TABS
ORAL_TABLET | ORAL | Status: AC
  Filled 2012-09-07: qty 1

## 2012-09-07 MED ORDER — NAPROXEN 250 MG PO TABS: 500.0000 mg | ORAL_TABLET | Freq: Two times a day (BID) | ORAL | Status: DC | PRN

## 2012-09-07 MED ORDER — IBUPROFEN 800 MG PO TABS
ORAL_TABLET | ORAL | Status: AC
  Administered 2012-09-07: 800 mg
  Filled 2012-09-07: qty 1

## 2012-09-07 MED ORDER — SODIUM CHLORIDE 0.9 % IJ SOLN
3.0000 mL | Freq: Two times a day (BID) | INTRAMUSCULAR | Status: DC
  Administered 2012-09-07 – 2012-09-08 (×3): 3 mL via INTRAVENOUS

## 2012-09-07 MED ORDER — ALUM & MAG HYDROXIDE-SIMETH 200-200-20 MG/5ML PO SUSP: 30.0000 mL | Freq: Four times a day (QID) | ORAL | Status: DC | PRN

## 2012-09-07 MED ORDER — VANCOMYCIN HCL IN DEXTROSE 1-5 GM/200ML-% IV SOLN
1000.0000 mg | Freq: Two times a day (BID) | INTRAVENOUS | Status: DC
  Administered 2012-09-07 – 2012-09-10 (×6): 1000 mg via INTRAVENOUS
  Filled 2012-09-07 (×11): qty 200

## 2012-09-07 MED ORDER — HYDROMORPHONE HCL PF 1 MG/ML IJ SOLN: 1.0000 mg | INTRAMUSCULAR | Status: DC | PRN

## 2012-09-07 MED ORDER — ALPRAZOLAM 0.5 MG PO TABS
1.0000 mg | ORAL_TABLET | Freq: Once | ORAL | Status: AC
  Administered 2012-09-07: 1 mg via ORAL
  Filled 2012-09-07: qty 2

## 2012-09-07 MED ORDER — ACETAMINOPHEN 650 MG RE SUPP: 650.0000 mg | Freq: Four times a day (QID) | RECTAL | Status: DC | PRN

## 2012-09-07 MED ORDER — SODIUM CHLORIDE 0.9 % IV SOLN
INTRAVENOUS | Status: DC
  Administered 2012-09-07 – 2012-09-10 (×3): via INTRAVENOUS

## 2012-09-07 MED ORDER — ENOXAPARIN SODIUM 40 MG/0.4ML ~~LOC~~ SOLN
40.0000 mg | SUBCUTANEOUS | Status: DC
  Administered 2012-09-07 – 2012-09-10 (×4): 40 mg via SUBCUTANEOUS
  Filled 2012-09-07 (×4): qty 0.4

## 2012-09-07 MED ORDER — LOPERAMIDE HCL 2 MG PO CAPS: 2.0000 mg | ORAL_CAPSULE | ORAL | Status: DC | PRN

## 2012-09-07 MED ORDER — TRAZODONE HCL 50 MG PO TABS
100.0000 mg | ORAL_TABLET | Freq: Every day | ORAL | Status: DC
  Administered 2012-09-07 – 2012-09-08 (×2): 200 mg via ORAL
  Administered 2012-09-09: 100 mg via ORAL
  Filled 2012-09-07 (×2): qty 4
  Filled 2012-09-07: qty 2

## 2012-09-07 MED ORDER — METHOCARBAMOL 500 MG PO TABS
500.0000 mg | ORAL_TABLET | Freq: Three times a day (TID) | ORAL | Status: DC | PRN
  Administered 2012-09-09 – 2012-09-10 (×3): 500 mg via ORAL
  Filled 2012-09-07 (×3): qty 1

## 2012-09-07 MED ORDER — ONDANSETRON 4 MG PO TBDP
4.0000 mg | ORAL_TABLET | Freq: Four times a day (QID) | ORAL | Status: DC | PRN
  Filled 2012-09-07: qty 1

## 2012-09-07 MED ORDER — FLUOXETINE HCL 20 MG PO CAPS
40.0000 mg | ORAL_CAPSULE | Freq: Two times a day (BID) | ORAL | Status: DC
  Administered 2012-09-07: 20 mg via ORAL
  Filled 2012-09-07: qty 2

## 2012-09-07 MED ORDER — PNEUMOCOCCAL VAC POLYVALENT 25 MCG/0.5ML IJ INJ
0.5000 mL | INJECTION | INTRAMUSCULAR | Status: AC
  Administered 2012-09-08: 0.5 mL via INTRAMUSCULAR
  Filled 2012-09-07: qty 0.5

## 2012-09-07 NOTE — ED Provider Notes (Signed)
This chart was scribed for Ward Givens, MD by Bennett Scrape, ED Scribe. This patient was seen in room [facility]08[facility] and the patient's care was started at 9;00 AM.  HPI Comments: Gail Castro is a 32 y.o. female who presents to the Emergency Department complaining of a left-sided facial abscess. She states that she was diagnosed with MRSA three years ago after being evaluated for an infected cut but reports ongoing issues with abscess throughout her body. Pt denies having a family h/o abscesses. She denies going to the gym and states that she uses gel soap and denies using bar soap.  PE HENT: Crusting lesion of the left medial cheek that is the size of a dime with diffuse redness to the surrounding cheek extending to the left eyelids, with pulling down of the left eyelid there is a bulging in conjunctiva that is draining pus, sclera are injected in the left eye SKIN: multiple small areas of redness from current skin infections on bilateral upper and lower extremities, multiple scars from prior skin infections on bilateral upper and lower extremities  Medical screening examination/treatment/procedure(s) were conducted as a shared visit with non-physician practitioner(s) and myself.  I personally evaluated the patient during the encounter  Devoria Albe, MD, FACEP   I personally performed the services described in this documentation, which was scribed in my presence. The recorded information has been reviewed and considered.  Devoria Albe, MD, Armando Gang    Ward Givens, MD 09/07/12 [id number]

## 2012-09-07 NOTE — ED Notes (Signed)
Facial redness and swelling. Pt states she first noticed a pimple 3-4 days ago. Pt states that when she presses her face, drainage comes from her eye.

## 2012-09-07 NOTE — Progress Notes (Signed)
Late Entry: 1600 Patient's 1600 PO Xanax held at this time due to the patient resting both eyes closed and in no apparent distress. -med to be given once the patient awakes  1625 Patient continues to rest in no apparent distress with RR and effort WDL. -both eyes closed Female companion at the bedside is resting both eyes closed and in no apparent distress at this time.

## 2012-09-07 NOTE — H&P (Signed)
[facility] History and Physical  Gail Castro QMV:[id number] DOB: [birth year] DOA: 09/07/2012  Referring physician:  PCP: No PCP Per Patient  Specialists:   Chief Complaint: left sided facial swelling  HPI: Gail Castro is a 32 y.o. female with past medical history that includes anxiety, bipolar disorder, heroin abuse presents to the emergency room with a chief complaint of left-sided facial swelling. Information is obtained from the patient and the boyfriend who is at bedside. Patient indicates that 3 days ago she had what she thought was acne on her left cheek. She indicated that she began to squeeze the very small lesion without results. She continued to apply pressure and over the last 3 days the area has become larger more firm and painful. The pain is located in the left cheek is dull and constant. It worse with palpation to 2 days ago she developed erythema around the lesion and her cheek. Yesterday she developed chills and reports subjective fever and mild nausea without emesis. This morning the swelling has extended to her eyelids. She reports drainage from her eye. She states she applied warm compresses without relief. She denies any recent travel or sick contacts. She denies any visual changes in the left eye. Workup in the emergency room included CT scan yielding  Left infraorbital soft tissue swelling and infiltration compatible with cellulitis without evidence for a discrete abscess. Normal appearance of the left globe and orbit. Draining lab work is unremarkable and patient is hemodynamically stable. Of note patient does report heroin use. She states that she uses heroin intravenously daily. She reports that her last heroin dose was yesterday. She reports she was due to start at a methadone clinic on July 7. She also reports he was in the hospital several weeks ago requesting detox and was placed however found the treatment not helpful so she left. Symptoms came on gradually have  persisted and worsened characterized as mild to moderate    Review of Systems: The patient denies anorexia,  weight loss,, vision loss, decreased hearing, hoarseness, chest pain, syncope, dyspnea on exertion, peripheral edema, balance deficits, hemoptysis, abdominal pain, melena, hematochezia, severe indigestion/heartburn, hematuria, incontinence, genital sores, muscle weakness, , transient blindness, difficulty walking, , unusual weight change, abnormal bleeding, enlarged lymph nodes, angioedema, and breast masses.    Past Medical History  Diagnosis Date  . Bipolar 1 disorder   . Anxiety   . MRSA (methicillin resistant Staphylococcus aureus)   . Heroin abuse   . Tobacco abuse    Past Surgical History  Procedure Laterality Date  . Eye surgery    . Arm    . Tubal ligation     Social History:  reports that she has been smoking.  She does not have any smokeless tobacco history on file. She reports that  drinks alcohol. She reports that she uses illicit drugs (Cocaine and Marijuana). Patient is unemployed and lives with her boyfriend. She reports daily heroin use as well as cocaine and marijuana intermittently. She reports having at least one EtOH beverage daily No Known Allergies  Family history: her mother is alive in her 57s and past medical history is unknown father is alive he 14 he has depression and hypertension she has one brother who is in good health  Prior to Admission medications   Medication Sig Start Date End Date Taking? Authorizing Provider  ALPRAZolam Prudy Feeler) 1 MG tablet Take 1 mg by mouth 3 (three) times daily.    Yes Historical Provider, MD  FLUoxetine (  PROZAC) 20 MG capsule Take 40 mg by mouth 2 (two) times daily.    Yes Historical Provider, MD  traZODone (DESYREL) 100 MG tablet Take 100-200 mg by mouth at bedtime.   Yes Historical Provider, MD   Physical Exam: Filed Vitals:   09/07/12 0814 09/07/12 1126  BP: 127/61 96/77  Pulse: 79 83  Temp: 97.8 F (36.6 C)    TempSrc: Oral   Resp: 16 18  Height: 5\' 9"  (1.753 m)   Weight: 61.236 kg (135 lb)   SpO2: 100% 100%     General:  Well-nourished no acute distress appears somewhat anxious  Eyes: PE RRL, EOMI, left eye with small amount of discharge. Left conjunctiva is injected.  ENT: Ears clear, nose without drainage oropharynx without erythema or exudate. Mucous membranes of her mouth are pink slightly dry.  Neck: Supple no JVD full range of motion no lymphadenopathy  Cardiovascular: Regular rate and rhythm no murmur gallop or rub no lower extremity edema  Respiratory: Normal effort breath sounds clear bilaterally no rhonchi wheezes or crackles  Abdomen: Soft positive bowel sounds nontender to palpation no mass organomegaly noted  Skin: Left cheek with domiciles crusted area that is firm and tender to touch. This area has surrounding erythema that extends to the left eyelids. Area also warm to touch. Patient with multiple red raised areas particularly on her forearms and lower legs. Multiple old lesions with scarring.  Musculoskeletal: No clubbing or cyanosis joints without swelling or erythema nontender  Psychiatric: Somewhat anxious cooperative calm. Patient is constantly scratching during the exam and has difficulty not interrupting  Neurologic: Cranial nerves II through XII grossly intact  Labs on Admission:  Basic Metabolic Panel:  Recent Labs Lab 09/07/12 0855  NA 140  K 3.8  CL 100  CO2 33*  GLUCOSE 104*  BUN 9  CREATININE 0.78  CALCIUM 9.5   Liver Function Tests: No results found for this basename: AST, ALT, ALKPHOS, BILITOT, PROT, ALBUMIN,  in the last 168 hours No results found for this basename: LIPASE, AMYLASE,  in the last 168 hours No results found for this basename: AMMONIA,  in the last 168 hours CBC:  Recent Labs Lab 09/07/12 0855  WBC 9.4  NEUTROABS 6.4  HGB 14.0  HCT 43.1  MCV 87.8  PLT 238   Cardiac Enzymes: No results found for this basename:  CKTOTAL, CKMB, CKMBINDEX, TROPONINI,  in the last 168 hours  BNP (last 3 results) No results found for this basename: PROBNP,  in the last 8760 hours CBG: No results found for this basename: GLUCAP,  in the last 168 hours  Radiological Exams on Admission: Ct Orbits W/cm  09/07/2012   *RADIOLOGY REPORT*  Clinical Data: Left infraorbital abscess.  Skin lesion.  CT ORBITS WITH CONTRAST  Technique:  Multidetector CT imaging of the orbits was performed following the bolus administration of intravenous contrast.  Contrast: 75mL OMNIPAQUE IOHEXOL 300 MG/ML  SOLN  Comparison: CT head without contrast 07/15/2012.  Findings: Extensive left infraorbital edematous changes and subcutaneous stranding is present.  There is some skin thickening as well.  This is compatible with a cellulitis.  No discrete abscess is evident.  There is mild edema extending into the left upper lid and supraorbital region as well.  Asymmetric edematous changes are noted along the left side of the nose.  The globe is within normal limits.  The orbits are within normal limits bilaterally.  Limited imaging the brain is unremarkable. The optic chiasm is normal.  The sella is unremarkable.  IMPRESSION:  1.  Left infraorbital soft tissue swelling and infiltration compatible with cellulitis without evidence for a discrete abscess. 2.  Normal appearance of the left globe and orbit.   Original Report Authenticated By: Marin Roberts, M.D.    EKG: I  Assessment/Plan Principal Problem:   Cellulitis: In patient with history of MRSA. Will admit patient to telemetry. Will continue Vanco and Zosyn per pharmacy. Currently patient is afebrile and nontoxic appearing. Will provide warm compresses and pain medicine as needed. We'll provide antimanic as needed. Will monitor closely. If no signs of improvement or indication area is worsening may need to repeat CT to check for developing abscess  Active Problems: Heroin abuse: Patient reports daily use  of IV heroin. Last used heroin yesterday. Urine drug screen positive for opiates cocaine binge those amphetamines and marijuana. Will admit to telemetry in anticipation of withdrawals. Will request HIV antibody. We'll provide pain medicine and anti-emetics as needed. Request withdrawal protocol from behavioral health.    Anxiety: Patient with history of same. Home medications is Xanax 1 mg 3 times a day we'll continue this.    Bipolar 1 disorder: Stable at baseline we'll continue home meds.  Tobacco use: counseled to quit.     Code Status: Full Family Communication: Boyfriend at bedside Disposition Plan: Home when ready likely 2-3 days  Time spent: 60 minutes  Gwenyth Bender [facility] Pager [phone]  If 7PM-7AM, please contact night-coverage [email or web address] Password [email or web address] 09/07/2012, 12:28 PM   Attending note:  Patient seen and independently examined.  Above note reviewed.  Patient has been admitted for a facial cellulitis.  CT does not indicate any underlying abscess.  She does have a history of MRSA and IV drug abuse. Will use broad spectrum coverage for now with vancomycin and zosyn. Start clonidine withdrawal protocol for opiate withdrawal. Urinalysis indicates UTI, will follow up cultures.  Antibiotics for cellulitis should cover urine as well.  MEMON,JEHANZEB

## 2012-09-07 NOTE — Progress Notes (Signed)
ANTIBIOTIC CONSULT NOTE - INITIAL  Pharmacy Consult for Vancomycin and Zosyn Indication: facial cellulitis  No Known Allergies  Patient Measurements: Height: 5\' 9"  (175.3 cm) Weight: 131 lb 6.3 oz (59.6 kg) IBW/kg (Calculated) : 66.2  Vital Signs: Temp: 96 F (35.6 C) (07/02 1200) Temp src: Oral (07/02 1200) BP: 96/77 mmHg (07/02 1126) Pulse Rate: 83 (07/02 1126) Intake/Output from previous day:   Intake/Output from this shift:    Labs:  Recent Labs  09/07/12 0855  WBC 9.4  HGB 14.0  PLT 238  CREATININE 0.78   Estimated Creatinine Clearance: 95.9 ml/min (by C-G formula based on Cr of 0.78). No results found for this basename: VANCOTROUGH, Leodis Binet, VANCORANDOM, GENTTROUGH, GENTPEAK, GENTRANDOM, TOBRATROUGH, TOBRAPEAK, TOBRARND, AMIKACINPEAK, AMIKACINTROU, AMIKACIN,  in the last 72 hours   Microbiology: Recent Results (from the past 720 hour(s))  CULTURE, BLOOD (SINGLE)     Status: None   Collection Time    09/07/12  8:56 AM      Result Value Range Status   Specimen Description Blood RIGHT ARM   Final   Special Requests BOTTLES DRAWN AEROBIC AND ANAEROBIC 8 CC EACH   Final   Culture PENDING   Incomplete   Report Status PENDING   Incomplete  CULTURE, BLOOD (SINGLE)     Status: None   Collection Time    09/07/12  9:08 AM      Result Value Range Status   Specimen Description Blood RIGHT ARM   Final   Special Requests BOTTLES DRAWN AEROBIC AND ANAEROBIC 8 CC EACH   Final   Culture PENDING   Incomplete   Report Status PENDING   Incomplete    Medical History: Past Medical History  Diagnosis Date  . Bipolar 1 disorder   . Anxiety   . MRSA (methicillin resistant Staphylococcus aureus)   . Heroin abuse   . Tobacco abuse     Medications:  Scheduled:  . ALPRAZolam  1 mg Oral TID  . enoxaparin (LOVENOX) injection  40 mg Subcutaneous Q24H  . FLUoxetine  40 mg Oral BID  . piperacillin-tazobactam (ZOSYN)  IV  3.375 g Intravenous Q8H  . [START ON 09/08/2012]  pneumococcal 23 valent vaccine  0.5 mL Intramuscular Tomorrow-1000  . sodium chloride  3 mL Intravenous Q12H  . traZODone  100-200 mg Oral QHS  . vancomycin  1,000 mg Intravenous Q12H   Assessment: 32yo female with PMH anxiety, bipolar d/o, and heroin abuse presents with cellulitis on face (left cheek) which has worsened over last 3 days.  Pt has good renal fxn.  Estimated Creatinine Clearance: 95.9 ml/min (by C-G formula based on Cr of 0.78).  Goal of Therapy:  Vancomycin trough level 10-15 mcg/ml  Plan: Vancomycin 1gm IV q12hrs Check trough at steady state Zosyn 3.375gm IV q8hrs to be infused over 4 hours Monitor labs, renal fxn, and cultures  Valrie Hart A 09/07/2012,12:58 PM

## 2012-09-07 NOTE — Progress Notes (Signed)
Patient awoke by dietary in that dinner has arrival patient given held medication (Xanax 1mg  PO).

## 2012-09-07 NOTE — ED Notes (Signed)
PA in to assess pt at this time. Holding report on bed due to may be changed to higher level due to drugs pt is on and w/d precautions.

## 2012-09-07 NOTE — ED Provider Notes (Signed)
History    CSN: [id number] Arrival date & time 09/07/12  0806  First MD Initiated Contact with Patient 09/07/12 [phone]     Chief Complaint  Patient presents with  . Abscess   (Consider location/radiation/quality/duration/timing/severity/associated sxs/prior Treatment) Patient is a 32 y.o. female presenting with abscess. The history is provided by the patient.  Abscess Location:  Face Facial abscess location:  Face and L cheek Abscess quality: painful, redness and warmth   Red streaking: yes   Duration:  4 days Progression:  Worsening Pain details:    Quality:  Throbbing   Severity:  Severe   Timing:  Constant   Progression:  Worsening Chronicity:  New Relieved by:  Nothing Worsened by:  Draining/squeezing Ineffective treatments:  Cold compresses Associated symptoms: headaches and nausea   Associated symptoms: no anorexia, no fever and no vomiting   Risk factors: hx of MRSA and prior abscess    CHANIA KOCHANSKI is a 32 y.o. female who presents to the ED with facial swelling and pain where a pimple started a week ago. She states that she had several little red areas on her arms and legs that get larger and then drain and go away. The areas itch. The one on the face didn't go away it just got larger and painful. Today she woke and the area was larger, red, painful and she noted drainage from her eye.   Past Medical History  Diagnosis Date  . Bipolar 1 disorder   . Anxiety   . MRSA (methicillin resistant Staphylococcus aureus)    Past Surgical History  Procedure Laterality Date  . Eye surgery    . Arm    . Tubal ligation     No family history on file. History  Substance Use Topics  . Smoking status: Current Every Day Smoker -- 1.00 packs/day  . Smokeless tobacco: Not on file  . Alcohol Use: Yes     Comment: Denies today (09/07/12)   OB History   Grav Para Term Preterm Abortions TAB SAB Ect Mult Living                 Review of Systems  Constitutional: Positive  for chills. Negative for fever and appetite change.  HENT: Positive for facial swelling and sinus pressure. Negative for ear pain, congestion, sore throat, rhinorrhea, trouble swallowing and neck pain.   Gastrointestinal: Positive for nausea. Negative for vomiting, abdominal pain and anorexia.  Skin: Positive for wound.  Neurological: Positive for headaches.  Psychiatric/Behavioral: Negative for confusion. The patient is not nervous/anxious.     Allergies  Review of patient's allergies indicates no known allergies.  Home Medications   Current Outpatient Rx  Name  Route  Sig  Dispense  Refill  . ALPRAZolam (XANAX) 1 MG tablet   Oral   Take 1 mg by mouth 3 (three) times daily.          Marland Kitchen FLUoxetine (PROZAC) 20 MG capsule   Oral   Take 40 mg by mouth 2 (two) times daily.          . traZODone (DESYREL) 100 MG tablet   Oral   Take 100-200 mg by mouth at bedtime.          BP 127/61  Pulse 79  Temp(Src) 97.8 F (36.6 C) (Oral)  Resp 16  Ht 5\' 9"  (1.753 m)  Wt 135 lb (61.236 kg)  BMI 19.93 kg/m2  SpO2 100%  LMP 09/05/2012 Physical Exam  Nursing note and vitals  reviewed. Constitutional: She is oriented to person, place, and time. She appears well-developed and well-nourished.  HENT:  Head: Head is with left periorbital erythema.  Nose: Left sinus exhibits maxillary sinus tenderness and frontal sinus tenderness.  There is a crusted area on the left cheek with surrounding erythema that extends to the left eye. The area is very tender with any touch.   Eyes: EOM are normal. Left eye exhibits discharge and exudate. Left conjunctiva is injected. Left eye exhibits no nystagmus.  Neck: Normal range of motion. Neck supple.  Cardiovascular: Normal rate, regular rhythm and normal heart sounds.   Pulmonary/Chest: Effort normal and breath sounds normal.  Abdominal: Soft. Bowel sounds are normal. There is no tenderness.  Musculoskeletal:  There are multiple red raised areas on the  upper and lower extremities and multiple old lesions with scaring where patient scratched.   Neurological: She is alert and oriented to person, place, and time. No cranial nerve deficit.  Skin: Skin is warm and dry.  Psychiatric: She has a normal mood and affect. Her behavior is normal. Thought content normal.   The patient is constantly picking at her skin while I am doing her H&P.   Results for orders placed during the hospital encounter of 09/07/12 (from the past 24 hour(s))  CBC WITH DIFFERENTIAL     Status: None   Collection Time    09/07/12  8:55 AM      Result Value Range   WBC 9.4  4.0 - 10.5 K/uL   RBC 4.91  3.87 - 5.11 MIL/uL   Hemoglobin 14.0  12.0 - 15.0 g/dL   HCT 16.1  09.6 - 04.5 %   MCV 87.8  78.0 - 100.0 fL   MCH 28.5  26.0 - 34.0 pg   MCHC 32.5  30.0 - 36.0 g/dL   RDW 40.9  81.1 - 91.4 %   Platelets 238  150 - 400 K/uL   Neutrophils Relative % 68  43 - 77 %   Neutro Abs 6.4  1.7 - 7.7 K/uL   Lymphocytes Relative 22  12 - 46 %   Lymphs Abs 2.1  0.7 - 4.0 K/uL   Monocytes Relative 6  3 - 12 %   Monocytes Absolute 0.5  0.1 - 1.0 K/uL   Eosinophils Relative 5  0 - 5 %   Eosinophils Absolute 0.4  0.0 - 0.7 K/uL   Basophils Relative 1  0 - 1 %   Basophils Absolute 0.1  0.0 - 0.1 K/uL  BASIC METABOLIC PANEL     Status: Abnormal   Collection Time    09/07/12  8:55 AM      Result Value Range   Sodium 140  135 - 145 mEq/L   Potassium 3.8  3.5 - 5.1 mEq/L   Chloride 100  96 - 112 mEq/L   CO2 33 (*) 19 - 32 mEq/L   Glucose, Bld 104 (*) 70 - 99 mg/dL   BUN 9  6 - 23 mg/dL   Creatinine, Ser 7.82  0.50 - 1.10 mg/dL   Calcium 9.5  8.4 - 95.6 mg/dL   GFR calc non Af Amer >90  >90 mL/min   GFR calc Af Amer >90  >90 mL/min  CULTURE, BLOOD (SINGLE)     Status: None   Collection Time    09/07/12  8:56 AM      Result Value Range   Specimen Description Blood RIGHT ARM     Special Requests BOTTLES DRAWN  AEROBIC AND ANAEROBIC 8 CC EACH     Culture PENDING     Report  Status PENDING    CULTURE, BLOOD (SINGLE)     Status: None   Collection Time    09/07/12  9:08 AM      Result Value Range   Specimen Description Blood RIGHT ARM     Special Requests BOTTLES DRAWN AEROBIC AND ANAEROBIC 8 CC EACH     Culture PENDING     Report Status PENDING    URINALYSIS, ROUTINE W REFLEX MICROSCOPIC     Status: Abnormal   Collection Time    09/07/12  9:36 AM      Result Value Range   Color, Urine YELLOW  YELLOW   APPearance CLEAR  CLEAR   Specific Gravity, Urine 1.025  1.005 - 1.030   pH 6.0  5.0 - 8.0   Glucose, UA NEGATIVE  NEGATIVE mg/dL   Hgb urine dipstick SMALL (*) NEGATIVE   Bilirubin Urine NEGATIVE  NEGATIVE   Ketones, ur NEGATIVE  NEGATIVE mg/dL   Protein, ur NEGATIVE  NEGATIVE mg/dL   Urobilinogen, UA 1.0  0.0 - 1.0 mg/dL   Nitrite NEGATIVE  NEGATIVE   Leukocytes, UA MODERATE (*) NEGATIVE  URINE RAPID DRUG SCREEN (HOSP PERFORMED)     Status: Abnormal   Collection Time    09/07/12  9:36 AM      Result Value Range   Opiates POSITIVE (*) NONE DETECTED   Cocaine POSITIVE (*) NONE DETECTED   Benzodiazepines POSITIVE (*) NONE DETECTED   Amphetamines POSITIVE (*) NONE DETECTED   Tetrahydrocannabinol POSITIVE (*) NONE DETECTED   Barbiturates NONE DETECTED  NONE DETECTED  URINE MICROSCOPIC-ADD ON     Status: Abnormal   Collection Time    09/07/12  9:36 AM      Result Value Range   Squamous Epithelial / LPF MANY (*) RARE   WBC, UA TOO NUMEROUS TO COUNT  <3 WBC/hpf   RBC / HPF TOO NUMEROUS TO COUNT  <3 RBC/hpf   Bacteria, UA MANY (*) RARE    Ct Orbits W/cm  09/07/2012   *RADIOLOGY REPORT*  Clinical Data: Left infraorbital abscess.  Skin lesion.  CT ORBITS WITH CONTRAST  Technique:  Multidetector CT imaging of the orbits was performed following the bolus administration of intravenous contrast.  Contrast: 75mL OMNIPAQUE IOHEXOL 300 MG/ML  SOLN  Comparison: CT head without contrast 07/15/2012.  Findings: Extensive left infraorbital edematous changes and  subcutaneous stranding is present.  There is some skin thickening as well.  This is compatible with a cellulitis.  No discrete abscess is evident.  There is mild edema extending into the left upper lid and supraorbital region as well.  Asymmetric edematous changes are noted along the left side of the nose.  The globe is within normal limits.  The orbits are within normal limits bilaterally.  Limited imaging the brain is unremarkable. The optic chiasm is normal.  The sella is unremarkable.  IMPRESSION:  1.  Left infraorbital soft tissue swelling and infiltration compatible with cellulitis without evidence for a discrete abscess. 2.  Normal appearance of the left globe and orbit.   Original Report Authenticated By: Marin Roberts, M.D.   Culture of drainage from left eye sent to lab. Vancomycin 1 gram IV and Zosyn 3.375 mg IV ordered ED Course: Dr. Lynelle Doctor in to examine the patient.  Procedures  I discussed the case with Dr. Kerry Hough on Admission Team 1 and he will admit. Temporary admission orders  written.   MDM  32 y.o. female with facial cellulitis.  Polysubstance abuse. Will admit for IV antibiotics.   [facility] Orlene Och, [place] 09/07/12 1121

## 2012-09-07 NOTE — ED Provider Notes (Signed)
See prior note   Ward Givens, MD 09/07/12 [phone]

## 2012-09-08 DIAGNOSIS — F411 Generalized anxiety disorder: Secondary | ICD-10-CM

## 2012-09-08 LAB — CBC
HCT: 36.1 % (ref 36.0–46.0)
Hemoglobin: 11.6 g/dL — ABNORMAL LOW (ref 12.0–15.0)
MCH: 28 pg (ref 26.0–34.0)
MCHC: 32.1 g/dL (ref 30.0–36.0)
MCV: 87.2 fL (ref 78.0–100.0)

## 2012-09-08 LAB — BASIC METABOLIC PANEL
BUN: 12 mg/dL (ref 6–23)
Calcium: 8.6 mg/dL (ref 8.4–10.5)
Creatinine, Ser: 0.91 mg/dL (ref 0.50–1.10)
GFR calc non Af Amer: 83 mL/min — ABNORMAL LOW (ref >90)
Glucose, Bld: 82 mg/dL (ref 70–99)
Potassium: 3.7 mEq/L (ref 3.5–5.1)

## 2012-09-08 NOTE — Clinical Social Work Psychosocial (Signed)
Clinical Social Work Department BRIEF PSYCHOSOCIAL ASSESSMENT 09/08/2012  Patient:  Gail Castro, Gail Castro     Account Number:  [id number]     Admit date:  09/07/2012  Clinical Social Worker:  Santa Genera, CLINICAL SOCIAL WORKER  Date/Time:  09/08/2012 12:15 PM  Referred by:  Physician  Date Referred:  09/08/2012 Referred for  Substance Abuse   Other Referral:   Interview type:  Patient Other interview type:    PSYCHOSOCIAL DATA Living Status:  FRIEND(S) Admitted from facility:   Level of care:   Primary support name:  Lazarus Gowda Primary support relationship to patient:  PARENT Degree of support available:   Patient has limited support, has boyfriend at bedside.    CURRENT CONCERNS Current Concerns  Substance Abuse   Other Concerns:    SOCIAL WORK ASSESSMENT / PLAN CSW met w patient at bedside, patient quite sleepy and did not open eyes but willing to engage in assessment.  Patient states she recently relapsed, approx 4 weeks ago.  Began w cocaine then progressed to heroin.  Has drug screen positive for benzodiazepines, cocaine, opiates, THC. Patient says she is currently in treatment w [facility] is trying to get treatment for multiple addictions.  Says she has been unable join NA group "because they won't let me."  Could not state what the barrier is.  Says is scheduled to be assessed at methadone clinic in [place] on 09/12/12.  At this time, she does not have transportation to attend as her boyfriend sold his car.  Patient says she can afford $1/day payment for methadone, understands that she will need to be clean from all other substances to receive treatment at this clinic.    Patient says she has been patient at [facility] and was "kicked out" because "they overmedicated me."  Says she was taken to hospital due to overmedication.  Says [facility] "will not pay for any treatment for me" but cannot give reason why.    Has current involvement w  CPS, her 3 children (11 and under) were removed from her custody approx 3 months ago. Two are wi their biological father, one placed in foster home.  Patient says she is trying to get in touch w her CPS caseworker but " no one will return my call."  Says her substance use has gotten worse since her children were removed from her care.  When CSW mentioned residential treatment facilities for women w children, patient stated she had too many court dates upcoming and did not want residential treatment.     Patient currently lives w her boyfriend who has been at her bedside during this hospitalization.    Patient says she wants help w substance use, has tried to access resources but has not been successful.  At this point, patient fell asleep and CSW could not continue to discuss patient needs.  CSW left SA resource list and encouraged patient to work w [facility] to get appropriate level of treatment.  As patient is currently engaged w community agency in pursuit of substance use treatment, CSW will sign off having encouraged patient to continue to work w [facility] and [facility].   Assessment/plan status:  No Further Intervention Required Other assessment/ plan:   Information/referral to community resources:   Substance use treatment facliities list    PATIENT'S/FAMILY'S RESPONSE TO PLAN OF CARE: Patient quite sleepy, seems discouraged w efforts to attain sobriety and lack of resources available to her.      Santa Genera,  LCSW Clinical Social Worker [phone])

## 2012-09-08 NOTE — Progress Notes (Signed)
[facility] PROGRESS NOTE  Gail HIDROGO QMV:[id number] DOB: June 26, 1980 DOA: 09/07/2012 PCP: No PCP Per Patient  Assessment/Plan: 1. Cellulitis of left cheek.  Patient has presented with cellulitis of left cheek and associated periorbital edema.  CT orbits done on admission did not show any evidence of underlying abscess or globe involvement. There is diffuse edema/cellulitis.  She is on broad spectrum antibiotics and is afebrile. May need to consider re-imaging if infection does not start to improve. Apply warm compresses to left cheek. HIV was negative. 2. Heroin abuse.  Patient plans to follow with a methadone clinic.  She is on clonidine withdrawal protocol. 3. Anxiety.  Continue home dose of xanax 4. Bipolar 1 disorder.  Stable at baseline 5. Tobacco abuse. Patient counseled on the importance of tobacco cessation.  Code Status: full code Family Communication: discussed with patient at the bedside Disposition Plan: discharge home once improved   Consultants:  none  Procedures:  none  Antibiotics:  Vancomycin 7/2  Zosyn 7/2  HPI/Subjective: Continues to have pain and drainage in left eye and on left cheek.  Also complains of pain in right eye now.  Reports that both eyes were crusted this morning when she woke up.  Denies any blurry vision.  Objective: Filed Vitals:   09/08/12 0300 09/08/12 0400 09/08/12 0500 09/08/12 0600  BP: 95/63 90/57 87/50  89/54  Pulse:      Temp:      TempSrc:      Resp: 20 18 25 17   Height:      Weight:      SpO2:        Intake/Output Summary (Last 24 hours) at 09/08/12 0921 Last data filed at 09/08/12 0600  Gross per 24 hour  Intake 2017.5 ml  Output      0 ml  Net 2017.5 ml   Filed Weights   09/07/12 0814 09/07/12 1200  Weight: 61.236 kg (135 lb) 59.6 kg (131 lb 6.3 oz)    Exam:   General:  NAD  Cardiovascular: S1, S2 RRR  Respiratory: CTA B  Abdomen: soft, nt, nd, bs+  Musculoskeletal: no pedal edema  HEENT:  swelling over left side of face.  Sclerae are clear, no discharge noted at present, edema of upper and lower eyelids.  Crusted lesion on left cheek.  Erythema appears to have improved.  Edema is still tender to touch and is warm.  Data Reviewed: Basic Metabolic Panel:  Recent Labs Lab 09/07/12 0855 09/08/12 0427  NA 140 140  K 3.8 3.7  CL 100 104  CO2 33* 30  GLUCOSE 104* 82  BUN 9 12  CREATININE 0.78 0.91  CALCIUM 9.5 8.6   Liver Function Tests: No results found for this basename: AST, ALT, ALKPHOS, BILITOT, PROT, ALBUMIN,  in the last 168 hours No results found for this basename: LIPASE, AMYLASE,  in the last 168 hours No results found for this basename: AMMONIA,  in the last 168 hours CBC:  Recent Labs Lab 09/07/12 0855 09/08/12 0427  WBC 9.4 5.4  NEUTROABS 6.4  --   HGB 14.0 11.6*  HCT 43.1 36.1  MCV 87.8 87.2  PLT 238 205   Cardiac Enzymes: No results found for this basename: CKTOTAL, CKMB, CKMBINDEX, TROPONINI,  in the last 168 hours BNP (last 3 results) No results found for this basename: PROBNP,  in the last 8760 hours CBG: No results found for this basename: GLUCAP,  in the last 168 hours  Recent Results (from the past  240 hour(s))  CULTURE, BLOOD (SINGLE)     Status: None   Collection Time    09/07/12  8:56 AM      Result Value Range Status   Specimen Description Blood RIGHT ARM   Final   Special Requests BOTTLES DRAWN AEROBIC AND ANAEROBIC 8 CC EACH   Final   Culture NO GROWTH 1 DAY   Final   Report Status PENDING   Incomplete  CULTURE, BLOOD (SINGLE)     Status: None   Collection Time    09/07/12  9:08 AM      Result Value Range Status   Specimen Description Blood RIGHT ARM   Final   Special Requests BOTTLES DRAWN AEROBIC AND ANAEROBIC 8 CC EACH   Final   Culture NO GROWTH 1 DAY   Final   Report Status PENDING   Incomplete  MRSA PCR SCREENING     Status: None   Collection Time    09/07/12 12:57 PM      Result Value Range Status   MRSA by PCR  NEGATIVE  NEGATIVE Final   Comment:            The GeneXpert MRSA Assay (FDA     approved for NASAL specimens     only), is one component of a     comprehensive MRSA colonization     surveillance program. It is not     intended to diagnose MRSA     infection nor to guide or     monitor treatment for     MRSA infections.     Studies: Ct Orbits W/cm  09/07/2012   *RADIOLOGY REPORT*  Clinical Data: Left infraorbital abscess.  Skin lesion.  CT ORBITS WITH CONTRAST  Technique:  Multidetector CT imaging of the orbits was performed following the bolus administration of intravenous contrast.  Contrast: 75mL OMNIPAQUE IOHEXOL 300 MG/ML  SOLN  Comparison: CT head without contrast 07/15/2012.  Findings: Extensive left infraorbital edematous changes and subcutaneous stranding is present.  There is some skin thickening as well.  This is compatible with a cellulitis.  No discrete abscess is evident.  There is mild edema extending into the left upper lid and supraorbital region as well.  Asymmetric edematous changes are noted along the left side of the nose.  The globe is within normal limits.  The orbits are within normal limits bilaterally.  Limited imaging the brain is unremarkable. The optic chiasm is normal.  The sella is unremarkable.  IMPRESSION:  1.  Left infraorbital soft tissue swelling and infiltration compatible with cellulitis without evidence for a discrete abscess. 2.  Normal appearance of the left globe and orbit.   Original Report Authenticated By: Marin Roberts, M.D.    Scheduled Meds: . ALPRAZolam  1 mg Oral TID  . cloNIDine  0.1 mg Oral QID   Followed by  . [START ON 09/10/2012] cloNIDine  0.1 mg Oral BH-qamhs   Followed by  . [START ON 09/12/2012] cloNIDine  0.1 mg Oral QAC breakfast  . enoxaparin (LOVENOX) injection  40 mg Subcutaneous Q24H  . FLUoxetine  20 mg Oral BID  . ketorolac  30 mg Intravenous Q6H  . nicotine  21 mg Transdermal Daily  . piperacillin-tazobactam (ZOSYN)  IV   3.375 g Intravenous Q8H  . pneumococcal 23 valent vaccine  0.5 mL Intramuscular Tomorrow-1000  . sodium chloride  3 mL Intravenous Q12H  . traZODone  100-200 mg Oral QHS  . vancomycin  1,000 mg Intravenous Q12H  Continuous Infusions: . sodium chloride 75 mL/hr at 09/08/12 0600    Principal Problem:   Cellulitis Active Problems:   Anxiety   Bipolar 1 disorder   Heroin abuse   Tobacco abuse   UTI (urinary tract infection)    Time spent:    [facility]  [facility] Pager [phone]. If 7PM-7AM, please contact night-coverage at [email or web address], password [facility] 09/08/2012, 9:21 AM  LOS: 1 day

## 2012-09-08 NOTE — Clinical Social Work Note (Signed)
Per RN and patient request, letter faxed to clerk of court documenting patient admission to hospital.  Patient had court date today.  Santa Genera, LCSW Clinical Social Worker [phone])

## 2012-09-09 LAB — CBC
Hemoglobin: 11.6 g/dL — ABNORMAL LOW (ref 12.0–15.0)
MCH: 28.4 pg (ref 26.0–34.0)
MCHC: 32.3 g/dL (ref 30.0–36.0)

## 2012-09-09 LAB — VANCOMYCIN, TROUGH: Vancomycin Tr: 14.4 ug/mL (ref 10.0–20.0)

## 2012-09-09 LAB — BASIC METABOLIC PANEL
GFR calc Af Amer: 74 mL/min — ABNORMAL LOW (ref >90)
Glucose, Bld: 112 mg/dL — ABNORMAL HIGH (ref 70–99)
Sodium: 143 mEq/L (ref 135–145)

## 2012-09-09 NOTE — Progress Notes (Signed)
[facility] PROGRESS NOTE  Gail Castro RUE:[id number] DOB: [birth year] DOA: 09/07/2012 PCP: No PCP Per Patient  Assessment/Plan: 1. Cellulitis of left cheek.  Patient has presented with cellulitis of left cheek and associated periorbital edema.  CT orbits done on admission did not show any evidence of underlying abscess or globe involvement. Diffuse edema/cellulitis appears to be slowly improving.  Skin does not feel as warm and cheek does not appear to be as edematous. If continues to improve, can likely transition to oral antibiotics tomorrow. Apply warm compresses to left cheek. HIV was negative. 2. Heroin abuse.  Patient plans to follow with a methadone clinic.  She is on clonidine withdrawal protocol. 3. Anxiety.  Continue home dose of xanax 4. Bipolar 1 disorder.  Stable at baseline 5. Tobacco abuse. Patient counseled on the importance of tobacco cessation.  Code Status: full code Family Communication: discussed with patient at the bedside Disposition Plan: discharge home once improved, likely tomorrow   Consultants:  none  Procedures:  none  Antibiotics:  Vancomycin 7/2  Zosyn 7/2  HPI/Subjective: Feels that there is less discharge from eyes, feels swelling in cheek is not as tight  Objective: Filed Vitals:   09/09/12 0000 09/09/12 0400 09/09/12 0500 09/09/12 0730  BP: 110/62 109/71    Pulse: 57 64    Temp: 98.1 F (36.7 C) 97.8 F (36.6 C)  98 F (36.7 C)  TempSrc: Oral Oral  Oral  Resp: 20 18  18   Height:      Weight:   63.8 kg (140 lb 10.5 oz)   SpO2: 100% 95%      Intake/Output Summary (Last 24 hours) at 09/09/12 0959 Last data filed at 09/09/12 0900  Gross per 24 hour  Intake   4030 ml  Output      0 ml  Net   4030 ml   Filed Weights   09/07/12 0814 09/07/12 1200 09/09/12 0500  Weight: 61.236 kg (135 lb) 59.6 kg (131 lb 6.3 oz) 63.8 kg (140 lb 10.5 oz)    Exam:   General:  NAD  Cardiovascular: S1, S2 RRR  Respiratory: CTA  B  Abdomen: soft, nt, nd, bs+  Musculoskeletal: no pedal edema  HEENT: swelling over left side of face.  Sclerae are clear, no discharge noted at present, edema of upper and lower eyelids.  Crusted lesion on left cheek.  Erythema appears to have improved. Edema is not as tender or warm today.  Data Reviewed: Basic Metabolic Panel:  Recent Labs Lab 09/07/12 0855 09/08/12 0427 09/09/12 0457  NA 140 140 143  K 3.8 3.7 4.1  CL 100 104 107  CO2 33* 30 31  GLUCOSE 104* 82 112*  BUN 9 12 10   CREATININE 0.78 0.91 1.13*  CALCIUM 9.5 8.6 8.8   Liver Function Tests: No results found for this basename: AST, ALT, ALKPHOS, BILITOT, PROT, ALBUMIN,  in the last 168 hours No results found for this basename: LIPASE, AMYLASE,  in the last 168 hours No results found for this basename: AMMONIA,  in the last 168 hours CBC:  Recent Labs Lab 09/07/12 0855 09/08/12 0427 09/09/12 0457  WBC 9.4 5.4 5.8  NEUTROABS 6.4  --   --   HGB 14.0 11.6* 11.6*  HCT 43.1 36.1 35.9*  MCV 87.8 87.2 88.0  PLT 238 205 208   Cardiac Enzymes: No results found for this basename: CKTOTAL, CKMB, CKMBINDEX, TROPONINI,  in the last 168 hours BNP (last 3 results) No results found  for this basename: PROBNP,  in the last 8760 hours CBG: No results found for this basename: GLUCAP,  in the last 168 hours  Recent Results (from the past 240 hour(s))  CULTURE, BLOOD (SINGLE)     Status: None   Collection Time    09/07/12  8:56 AM      Result Value Range Status   Specimen Description Blood RIGHT ARM   Final   Special Requests BOTTLES DRAWN AEROBIC AND ANAEROBIC 8 CC EACH   Final   Culture NO GROWTH 2 DAYS   Final   Report Status PENDING   Incomplete  CULTURE, BLOOD (SINGLE)     Status: None   Collection Time    09/07/12  9:08 AM      Result Value Range Status   Specimen Description Blood RIGHT ARM   Final   Special Requests BOTTLES DRAWN AEROBIC AND ANAEROBIC 8 CC EACH   Final   Culture NO GROWTH 2 DAYS   Final    Report Status PENDING   Incomplete  URINE CULTURE     Status: None   Collection Time    09/07/12  9:36 AM      Result Value Range Status   Specimen Description URINE, CLEAN CATCH   Final   Special Requests NONE   Final   Culture  Setup Time 09/07/2012 17:55   Final   Colony Count PENDING   Incomplete   Culture Culture reincubated for better growth   Final   Report Status PENDING   Incomplete  MRSA PCR SCREENING     Status: None   Collection Time    09/07/12 12:57 PM      Result Value Range Status   MRSA by PCR NEGATIVE  NEGATIVE Final   Comment:            The GeneXpert MRSA Assay (FDA     approved for NASAL specimens     only), is one component of a     comprehensive MRSA colonization     surveillance program. It is not     intended to diagnose MRSA     infection nor to guide or     monitor treatment for     MRSA infections.     Studies: No results found.  Scheduled Meds: . ALPRAZolam  1 mg Oral TID  . cloNIDine  0.1 mg Oral QID   Followed by  . [START ON 09/10/2012] cloNIDine  0.1 mg Oral BH-qamhs   Followed by  . [START ON 09/12/2012] cloNIDine  0.1 mg Oral QAC breakfast  . enoxaparin (LOVENOX) injection  40 mg Subcutaneous Q24H  . FLUoxetine  20 mg Oral BID  . ketorolac  30 mg Intravenous Q6H  . nicotine  21 mg Transdermal Daily  . piperacillin-tazobactam (ZOSYN)  IV  3.375 g Intravenous Q8H  . sodium chloride  3 mL Intravenous Q12H  . traZODone  100-200 mg Oral QHS  . vancomycin  1,000 mg Intravenous Q12H   Continuous Infusions: . sodium chloride 75 mL/hr at 09/09/12 0900    Principal Problem:   Cellulitis Active Problems:   Anxiety   Bipolar 1 disorder   Heroin abuse   Tobacco abuse   UTI (urinary tract infection)    Time spent:    [facility]  [facility] Pager [phone]. If 7PM-7AM, please contact night-coverage at [email or web address], password [facility] 09/09/2012, 9:59 AM  LOS: 2 days

## 2012-09-09 NOTE — Progress Notes (Signed)
Report called to [facility] on department 200.  Patient is ready for transfer to room 222.  No acute distress noted.  Patient resting.

## 2012-09-09 NOTE — Progress Notes (Signed)
ANTIBIOTIC CONSULT NOTE  Pharmacy Consult for Vancomycin and Zosyn Indication: facial cellulitis  No Known Allergies  Patient Measurements: Height: 5\' 9"  (175.3 cm) Weight: 140 lb 10.5 oz (63.8 kg) IBW/kg (Calculated) : 66.2  Vital Signs: Temp: 98 F (36.7 C) (07/04 1459) Temp src: Oral (07/04 1459) BP: 114/74 mmHg (07/04 1459) Pulse Rate: 59 (07/04 1459) Intake/Output from previous day: 07/03 0701 - 07/04 0700 In: 4270 [P.O.:1920; I.V.:1800; IV Piggyback:550] Out: -  Intake/Output from this shift: Total I/O In: 150 [I.V.:150] Out: -   Labs:  Recent Labs  09/07/12 0855 09/08/12 0427 09/09/12 0457  WBC 9.4 5.4 5.8  HGB 14.0 11.6* 11.6*  PLT 238 205 208  CREATININE 0.78 0.91 1.13*   Estimated Creatinine Clearance: 72.7 ml/min (by C-G formula based on Cr of 1.13).  Recent Labs  09/09/12 1302  VANCOTROUGH 14.4     Microbiology: Recent Results (from the past 720 hour(s))  CULTURE, BLOOD (SINGLE)     Status: None   Collection Time    09/07/12  8:56 AM      Result Value Range Status   Specimen Description Blood RIGHT ARM   Final   Special Requests BOTTLES DRAWN AEROBIC AND ANAEROBIC 8 CC EACH   Final   Culture NO GROWTH 2 DAYS   Final   Report Status PENDING   Incomplete  CULTURE, BLOOD (SINGLE)     Status: None   Collection Time    09/07/12  9:08 AM      Result Value Range Status   Specimen Description Blood RIGHT ARM   Final   Special Requests BOTTLES DRAWN AEROBIC AND ANAEROBIC 8 CC EACH   Final   Culture NO GROWTH 2 DAYS   Final   Report Status PENDING   Incomplete  URINE CULTURE     Status: None   Collection Time    09/07/12  9:36 AM      Result Value Range Status   Specimen Description URINE, CLEAN CATCH   Final   Special Requests NONE   Final   Culture  Setup Time 09/07/2012 17:55   Final   Colony Count PENDING   Incomplete   Culture Culture reincubated for better growth   Final   Report Status PENDING   Incomplete  CULTURE, ROUTINE-ABSCESS      Status: None   Collection Time    09/07/12 10:15 AM      Result Value Range Status   Specimen Description EYE   Final   Special Requests NONE   Final   Gram Stain PENDING   Incomplete   Culture     Final   Value: FEW STAPHYLOCOCCUS AUREUS     Note: RIFAMPIN AND GENTAMICIN SHOULD NOT BE USED AS SINGLE DRUGS FOR TREATMENT OF STAPH INFECTIONS. This organism DOES NOT demonstrate inducible Clindamycin resistance in vitro.   Report Status PENDING   Incomplete   Organism ID, Bacteria STAPHYLOCOCCUS AUREUS   Final  MRSA PCR SCREENING     Status: None   Collection Time    09/07/12 12:57 PM      Result Value Range Status   MRSA by PCR NEGATIVE  NEGATIVE Final   Comment:            The GeneXpert MRSA Assay (FDA     approved for NASAL specimens     only), is one component of a     comprehensive MRSA colonization     surveillance program. It is not  intended to diagnose MRSA     infection nor to guide or     monitor treatment for     MRSA infections.    Medical History: Past Medical History  Diagnosis Date  . Bipolar 1 disorder   . Anxiety   . MRSA (methicillin resistant Staphylococcus aureus)   . Heroin abuse   . Tobacco abuse     Medications:  Scheduled:  . ALPRAZolam  1 mg Oral TID  . cloNIDine  0.1 mg Oral QID   Followed by  . [START ON 09/10/2012] cloNIDine  0.1 mg Oral BH-qamhs   Followed by  . [START ON 09/12/2012] cloNIDine  0.1 mg Oral QAC breakfast  . enoxaparin (LOVENOX) injection  40 mg Subcutaneous Q24H  . FLUoxetine  20 mg Oral BID  . nicotine  21 mg Transdermal Daily  . piperacillin-tazobactam (ZOSYN)  IV  3.375 g Intravenous Q8H  . sodium chloride  3 mL Intravenous Q12H  . traZODone  100-200 mg Oral QHS  . vancomycin  1,000 mg Intravenous Q12H   Assessment: 32yo female admitted with worsening cellulitis on face (left cheek).  Patient is clinically improving on day#3 Vancomycin & Zosyn.  Eye + MSSA.   Vancomycin trough within goal range today.  Renal  function is worsening.    Goal of Therapy:  Vancomycin trough level 10-15 mcg/ml  Plan: Continue Vancomycin 1gm IV q12hrs Continue Zosyn 3.375gm IV q8hrs to be infused over 4 hours Weekly Vancomycin trough while on Vancomycin or sooner if renal fxn continues to worsen Monitor labs, renal fxn, and cultures Duration of therapy per MD -*Consider de-escalate abx regimen given + cx data  Elson Clan 09/09/2012,3:08 PM

## 2012-09-10 DIAGNOSIS — F17200 Nicotine dependence, unspecified, uncomplicated: Secondary | ICD-10-CM

## 2012-09-10 MED ORDER — ALPRAZOLAM 1 MG PO TABS: 1.0000 mg | ORAL_TABLET | Freq: Three times a day (TID) | ORAL | Status: DC | PRN

## 2012-09-10 MED ORDER — SULFAMETHOXAZOLE-TRIMETHOPRIM 800-160 MG PO TABS: 1.0000 | ORAL_TABLET | Freq: Two times a day (BID) | ORAL | Status: DC

## 2012-09-10 NOTE — Progress Notes (Signed)
IV removed, site WNL.  Pt given d/c instructions and new prescriptions, states she will get them filed today.  Discussed home care with patient and discussed home medications, patient verbalizes understanding, teachback completed. F/U appointment will be made by patient for the health department when it opens on Monday, pt states she will make and keep appointment. Pt is stable at this time and is waiting for her ride, she will notify me when they arrive.

## 2012-09-10 NOTE — Discharge Summary (Signed)
Physician Discharge Summary  Gail Castro ZOX:[id number] DOB: 1980-05-05 DOA: 09/07/2012  PCP: No PCP Per Patient  Admit date: 09/07/2012 Discharge date: 09/10/2012  Time spent: 35 minutes  Recommendations for Outpatient Follow-up:  1. Follow up with a primary care physician in 2 weeks 2. Follow up with a methadone clinic for addiction needs  Discharge Diagnoses:  Principal Problem:   Cellulitis Active Problems:   Anxiety   Bipolar 1 disorder   Heroin abuse   Tobacco abuse   UTI (urinary tract infection)   Discharge Condition: improved  Diet recommendation: regular  Filed Weights   09/07/12 0814 09/07/12 1200 09/09/12 0500  Weight: 61.236 kg (135 lb) 59.6 kg (131 lb 6.3 oz) 63.8 kg (140 lb 10.5 oz)    History of present illness:  Gail Castro is a 32 y.o. female with past medical history that includes anxiety, bipolar disorder, heroin abuse presents to the emergency room with a chief complaint of left-sided facial swelling. Information is obtained from the patient and the boyfriend who is at bedside. Patient indicates that 3 days ago she had what she thought was acne on her left cheek. She indicated that she began to squeeze the very small lesion without results. She continued to apply pressure and over the last 3 days the area has become larger more firm and painful. The pain is located in the left cheek is dull and constant. It worse with palpation to 2 days ago she developed erythema around the lesion and her cheek. Yesterday she developed chills and reports subjective fever and mild nausea without emesis. This morning the swelling has extended to her eyelids. She reports drainage from her eye. She states she applied warm compresses without relief. She denies any recent travel or sick contacts. She denies any visual changes in the left eye. Workup in the emergency room included CT scan yielding Left infraorbital soft tissue swelling and infiltration compatible with cellulitis  without evidence for a discrete abscess. Normal appearance of the left globe and orbit. Draining lab work is unremarkable and patient is hemodynamically stable. Of note patient does report heroin use. She states that she uses heroin intravenously daily. She reports that her last heroin dose was yesterday. She reports she was due to start at a methadone clinic on July 7. She also reports he was in the hospital several weeks ago requesting detox and was placed however found the treatment not helpful so she left. Symptoms came on gradually have persisted and worsened characterized as mild to moderate   Hospital Course:  This patient was admitted to the hospital with left facial cellulitis in the cheek that was extending to the left periorbital area.  CT of the orbits was done which did not indicate any globe involvement.  She was having some discharge from the left eye.  This was cultured and was positive for MSSA.  She improved with administration of IV antibiotics.  Erythema/warmth and edema have near resolved. This will be changed over to bactrim to complete the course.  She has been advised to follow up with a primary care doctor in 2 weeks.  She also reports considering going to a methadone clinic in [place] to help with her heroin addiction.   Procedures:  none  Consultations:  none  Discharge Exam: Filed Vitals:   09/09/12 1459 09/09/12 1744 09/09/12 2215 09/10/12 0635  BP: 114/74 92/58 103/58 102/68  Pulse: 59  57 57  Temp: 98 F (36.7 C)  97.8 F (36.6  C) 98.1 F (36.7 C)  TempSrc: Oral  Oral Oral  Resp: 18  18 18   Height:      Weight:      SpO2: 97%  97% 94%    General: NAD Cardiovascular: S1, s2 RRR Respiratory: CTA B HEENT: swelling in left cheek continues to improve.  Discharge Instructions  Discharge Orders   Future Orders Complete By Expires     Call MD for:  redness, tenderness, or signs of infection (pain, swelling, redness, odor or green/yellow discharge  around incision site)  As directed     Call MD for:  severe uncontrolled pain  As directed     Call MD for:  temperature >100.4  As directed     Diet - low sodium heart healthy  As directed     Increase activity slowly  As directed         Medication List         ALPRAZolam 1 MG tablet  Commonly known as:  XANAX  Take 1 tablet (1 mg total) by mouth 3 (three) times daily as needed for sleep.     FLUoxetine 20 MG capsule  Commonly known as:  PROZAC  Take 40 mg by mouth 2 (two) times daily.     sulfamethoxazole-trimethoprim 800-160 MG per tablet  Commonly known as:  BACTRIM DS  Take 1 tablet by mouth 2 (two) times daily.     traZODone 100 MG tablet  Commonly known as:  DESYREL  Take 100-200 mg by mouth at bedtime.       No Known Allergies     Follow-up Information   Follow up with primary care doctor at health dept in 2 weeks.       The results of significant diagnostics from this hospitalization (including imaging, microbiology, ancillary and laboratory) are listed below for reference.    Significant Diagnostic Studies: Ct Orbits W/cm  09/07/2012   *RADIOLOGY REPORT*  Clinical Data: Left infraorbital abscess.  Skin lesion.  CT ORBITS WITH CONTRAST  Technique:  Multidetector CT imaging of the orbits was performed following the bolus administration of intravenous contrast.  Contrast: 75mL OMNIPAQUE IOHEXOL 300 MG/ML  SOLN  Comparison: CT head without contrast 07/15/2012.  Findings: Extensive left infraorbital edematous changes and subcutaneous stranding is present.  There is some skin thickening as well.  This is compatible with a cellulitis.  No discrete abscess is evident.  There is mild edema extending into the left upper lid and supraorbital region as well.  Asymmetric edematous changes are noted along the left side of the nose.  The globe is within normal limits.  The orbits are within normal limits bilaterally.  Limited imaging the brain is unremarkable. The optic chiasm is  normal.  The sella is unremarkable.  IMPRESSION:  1.  Left infraorbital soft tissue swelling and infiltration compatible with cellulitis without evidence for a discrete abscess. 2.  Normal appearance of the left globe and orbit.   Original Report Authenticated By: Marin Roberts, M.D.    Microbiology: Recent Results (from the past 240 hour(s))  CULTURE, BLOOD (SINGLE)     Status: None   Collection Time    09/07/12  8:56 AM      Result Value Range Status   Specimen Description Blood RIGHT ARM   Final   Special Requests BOTTLES DRAWN AEROBIC AND ANAEROBIC 8 CC EACH   Final   Culture NO GROWTH 3 DAYS   Final   Report Status PENDING   Incomplete  CULTURE, BLOOD (SINGLE)     Status: None   Collection Time    09/07/12  9:08 AM      Result Value Range Status   Specimen Description Blood RIGHT ARM   Final   Special Requests BOTTLES DRAWN AEROBIC AND ANAEROBIC 8 CC EACH   Final   Culture NO GROWTH 3 DAYS   Final   Report Status PENDING   Incomplete  URINE CULTURE     Status: None   Collection Time    09/07/12  9:36 AM      Result Value Range Status   Specimen Description URINE, CLEAN CATCH   Final   Special Requests NONE   Final   Culture  Setup Time 09/07/2012 17:55   Final   Colony Count PENDING   Incomplete   Culture Culture reincubated for better growth   Final   Report Status PENDING   Incomplete  CULTURE, ROUTINE-ABSCESS     Status: None   Collection Time    09/07/12 10:15 AM      Result Value Range Status   Specimen Description EYE   Final   Special Requests NONE   Final   Gram Stain PENDING   Incomplete   Culture     Final   Value: FEW STAPHYLOCOCCUS AUREUS     Note: RIFAMPIN AND GENTAMICIN SHOULD NOT BE USED AS SINGLE DRUGS FOR TREATMENT OF STAPH INFECTIONS. This organism DOES NOT demonstrate inducible Clindamycin resistance in vitro.   Report Status PENDING   Incomplete   Organism ID, Bacteria STAPHYLOCOCCUS AUREUS   Final  MRSA PCR SCREENING     Status: None    Collection Time    09/07/12 12:57 PM      Result Value Range Status   MRSA by PCR NEGATIVE  NEGATIVE Final   Comment:            The GeneXpert MRSA Assay (FDA     approved for NASAL specimens     only), is one component of a     comprehensive MRSA colonization     surveillance program. It is not     intended to diagnose MRSA     infection nor to guide or     monitor treatment for     MRSA infections.     Labs: Basic Metabolic Panel:  Recent Labs Lab 09/07/12 0855 09/08/12 0427 09/09/12 0457  NA 140 140 143  K 3.8 3.7 4.1  CL 100 104 107  CO2 33* 30 31  GLUCOSE 104* 82 112*  BUN 9 12 10   CREATININE 0.78 0.91 1.13*  CALCIUM 9.5 8.6 8.8   Liver Function Tests: No results found for this basename: AST, ALT, ALKPHOS, BILITOT, PROT, ALBUMIN,  in the last 168 hours No results found for this basename: LIPASE, AMYLASE,  in the last 168 hours No results found for this basename: AMMONIA,  in the last 168 hours CBC:  Recent Labs Lab 09/07/12 0855 09/08/12 0427 09/09/12 0457  WBC 9.4 5.4 5.8  NEUTROABS 6.4  --   --   HGB 14.0 11.6* 11.6*  HCT 43.1 36.1 35.9*  MCV 87.8 87.2 88.0  PLT 238 205 208   Cardiac Enzymes: No results found for this basename: CKTOTAL, CKMB, CKMBINDEX, TROPONINI,  in the last 168 hours BNP: BNP (last 3 results) No results found for this basename: PROBNP,  in the last 8760 hours CBG: No results found for this basename: GLUCAP,  in the last 168 hours  Signed:  Stanly Si  [facility] 09/10/2012, 12:21 PM

## 2012-09-11 LAB — CULTURE, ROUTINE-ABSCESS: Gram Stain: NONE SEEN

## 2012-09-11 LAB — URINE CULTURE

## 2012-09-12 LAB — CULTURE, BLOOD (SINGLE)
Culture: NO GROWTH
Culture: NO GROWTH

## 2013-05-26 ENCOUNTER — Emergency Department (HOSPITAL_COMMUNITY)
Admission: EM | Admit: 2013-05-26 | Discharge: 2013-05-26 | Disposition: A | Discharge status: Home / Self Care | Payer: Self-pay

## 2013-05-26 NOTE — ED Notes (Signed)
Pt upset and in and out of triage multiple times yelling and cursing. Pt states she is only here for detox and does not want to be here 3-4 hours then all night. Denies si/hi. RN states to patient that she can not give her an exact time frame of how long she will be here until she is seen and discharged. Pt upset and yelling. Security at doorway. Pt refuses to talk to RN until door is closed and security steps out. RN states she will close door but security will remain in room for RN safety. PT jumps up yelling and cursing and leaves.

## 2014-05-16 ENCOUNTER — Emergency Department (HOSPITAL_COMMUNITY): Payer: Medicaid Other

## 2014-05-16 ENCOUNTER — Emergency Department (HOSPITAL_COMMUNITY)
Admission: EM | Admit: 2014-05-16 | Discharge: 2014-05-16 | Disposition: A | Discharge status: Home / Self Care | Payer: Medicaid Other | Attending: Emergency Medicine | Admitting: Emergency Medicine

## 2014-05-16 ENCOUNTER — Encounter (HOSPITAL_COMMUNITY): Payer: Self-pay | Admitting: Emergency Medicine

## 2014-05-16 DIAGNOSIS — Z3202 Encounter for pregnancy test, result negative: Secondary | ICD-10-CM | POA: Insufficient documentation

## 2014-05-16 DIAGNOSIS — F1210 Cannabis abuse, uncomplicated: Secondary | ICD-10-CM | POA: Insufficient documentation

## 2014-05-16 DIAGNOSIS — F419 Anxiety disorder, unspecified: Secondary | ICD-10-CM | POA: Insufficient documentation

## 2014-05-16 DIAGNOSIS — F1510 Other stimulant abuse, uncomplicated: Secondary | ICD-10-CM | POA: Insufficient documentation

## 2014-05-16 DIAGNOSIS — R079 Chest pain, unspecified: Secondary | ICD-10-CM

## 2014-05-16 DIAGNOSIS — F1910 Other psychoactive substance abuse, uncomplicated: Secondary | ICD-10-CM

## 2014-05-16 DIAGNOSIS — Z8614 Personal history of Methicillin resistant Staphylococcus aureus infection: Secondary | ICD-10-CM | POA: Insufficient documentation

## 2014-05-16 DIAGNOSIS — Z79899 Other long term (current) drug therapy: Secondary | ICD-10-CM | POA: Insufficient documentation

## 2014-05-16 DIAGNOSIS — F319 Bipolar disorder, unspecified: Secondary | ICD-10-CM | POA: Insufficient documentation

## 2014-05-16 DIAGNOSIS — Z720 Tobacco use: Secondary | ICD-10-CM | POA: Insufficient documentation

## 2014-05-16 LAB — URINALYSIS, ROUTINE W REFLEX MICROSCOPIC
BILIRUBIN URINE: NEGATIVE
Glucose, UA: NEGATIVE mg/dL
Ketones, ur: NEGATIVE mg/dL
Leukocytes, UA: NEGATIVE
NITRITE: NEGATIVE
PH: 8 (ref 5.0–8.0)
PROTEIN: NEGATIVE mg/dL
Specific Gravity, Urine: 1.015 (ref 1.005–1.030)
UROBILINOGEN UA: 0.2 mg/dL (ref 0.0–1.0)

## 2014-05-16 LAB — CBC WITH DIFFERENTIAL/PLATELET
BASOS ABS: 0.1 10*3/uL (ref 0.0–0.1)
BASOS PCT: 1 % (ref 0–1)
EOS PCT: 2 % (ref 0–5)
Eosinophils Absolute: 0.2 10*3/uL (ref 0.0–0.7)
HCT: 40.5 % (ref 36.0–46.0)
Hemoglobin: 13.1 g/dL (ref 12.0–15.0)
Lymphocytes Relative: 31 % (ref 12–46)
Lymphs Abs: 3.3 10*3/uL (ref 0.7–4.0)
MCH: 29 pg (ref 26.0–34.0)
MCHC: 32.3 g/dL (ref 30.0–36.0)
MCV: 89.8 fL (ref 78.0–100.0)
MONO ABS: 0.6 10*3/uL (ref 0.1–1.0)
MONOS PCT: 5 % (ref 3–12)
NEUTROS ABS: 6.5 10*3/uL (ref 1.7–7.7)
Neutrophils Relative %: 61 % (ref 43–77)
PLATELETS: 313 10*3/uL (ref 150–400)
RBC: 4.51 MIL/uL (ref 3.87–5.11)
RDW: 14.3 % (ref 11.5–15.5)
WBC: 10.6 10*3/uL — AB (ref 4.0–10.5)

## 2014-05-16 LAB — COMPREHENSIVE METABOLIC PANEL
ALBUMIN: 3.6 g/dL (ref 3.5–5.2)
ALK PHOS: 57 U/L (ref 39–117)
ALT: 45 U/L — ABNORMAL HIGH (ref 0–35)
ANION GAP: 6 (ref 5–15)
AST: 34 U/L (ref 0–37)
BUN: 9 mg/dL (ref 6–23)
CALCIUM: 8.8 mg/dL (ref 8.4–10.5)
CO2: 29 mmol/L (ref 19–32)
Chloride: 104 mmol/L (ref 96–112)
Creatinine, Ser: 0.78 mg/dL (ref 0.50–1.10)
Glucose, Bld: 101 mg/dL — ABNORMAL HIGH (ref 70–99)
POTASSIUM: 3.9 mmol/L (ref 3.5–5.1)
Sodium: 139 mmol/L (ref 135–145)
Total Protein: 6.6 g/dL (ref 6.0–8.3)

## 2014-05-16 LAB — RAPID URINE DRUG SCREEN, HOSP PERFORMED
Amphetamines: POSITIVE — AB
Barbiturates: NOT DETECTED
Benzodiazepines: NOT DETECTED
COCAINE: NOT DETECTED
OPIATES: NOT DETECTED
TETRAHYDROCANNABINOL: POSITIVE — AB

## 2014-05-16 LAB — URINE MICROSCOPIC-ADD ON

## 2014-05-16 LAB — TROPONIN I: Troponin I: <0.03 ng/mL (ref <0.031)

## 2014-05-16 LAB — ETHANOL: Alcohol, Ethyl (B): <5 mg/dL (ref 0–9)

## 2014-05-16 LAB — POC URINE PREG, ED: PREG TEST UR: NEGATIVE

## 2014-05-16 MED ORDER — LORAZEPAM 2 MG/ML IJ SOLN
1.0000 mg | Freq: Once | INTRAMUSCULAR | Status: AC
  Administered 2014-05-16: 1 mg via INTRAMUSCULAR
  Filled 2014-05-16: qty 1

## 2014-05-16 MED ORDER — KETOROLAC TROMETHAMINE 60 MG/2ML IM SOLN
60.0000 mg | Freq: Once | INTRAMUSCULAR | Status: AC
  Administered 2014-05-16: 60 mg via INTRAMUSCULAR
  Filled 2014-05-16: qty 2

## 2014-05-16 NOTE — ED Provider Notes (Signed)
TIME SEEN: 4:45 AM  CHIEF COMPLAINT: Back pain, hand and ankle swelling, tingling in her bilateral fingers, chest pain  HPI: Pt is a 34 y.o. female with history of substance abuse, bipolar disorder who presents the emergency department with multiple complaints. Reports that after using methamphetamine yesterday afternoon she began having chest tightness, feeling tingling in her bilateral fingers and pain that felt like a "bee sting", felt like her eyes were twitching and having back pain. States she feels like her hands and feet are also swollen. States that she thinks it was a methamphetamine that cause these symptoms. States yesterday she also drink alcohol the denies any drug use.  Patient does have a history of heroin abuse. States she has used methamphetamine in the past.  ROS: See HPI Constitutional: no fever  Eyes: no drainage  ENT: no runny nose   Cardiovascular:   chest pain  Resp: no SOB  GI: no vomiting GU: no dysuria Integumentary: no rash  Allergy: no hives  Musculoskeletal: no leg swelling  Neurological: no slurred speech ROS otherwise negative  PAST MEDICAL HISTORY/PAST SURGICAL HISTORY:  Past Medical History  Diagnosis Date  . Bipolar 1 disorder   . Anxiety   . MRSA (methicillin resistant Staphylococcus aureus)   . Heroin abuse   . Tobacco abuse     MEDICATIONS:  Prior to Admission medications   Medication Sig Start Date End Date Taking? Authorizing Provider  ALPRAZolam Prudy Feeler(XANAX) 1 MG tablet Take 1 tablet (1 mg total) by mouth 3 (three) times daily as needed for sleep. 09/10/12   Erick BlinksJehanzeb Memon, MD  FLUoxetine (PROZAC) 20 MG capsule Take 40 mg by mouth 2 (two) times daily.     Historical Provider, MD  sulfamethoxazole-trimethoprim (BACTRIM DS) 800-160 MG per tablet Take 1 tablet by mouth 2 (two) times daily. 09/10/12   Erick BlinksJehanzeb Memon, MD  traZODone (DESYREL) 100 MG tablet Take 100-200 mg by mouth at bedtime.    Historical Provider, MD    ALLERGIES:  No Known  Allergies  SOCIAL HISTORY:  History  Substance Use Topics  . Smoking status: Current Every Day Smoker -- 1.00 packs/day  . Smokeless tobacco: Not on file  . Alcohol Use: Yes     Comment: Denies today (09/07/12)    FAMILY HISTORY: History reviewed. No pertinent family history.  EXAM: BP 128/91 mmHg  Pulse 70  Temp(Src) 97.8 F (36.6 C)  Resp 18  Ht 5\' 9"  (1.753 m)  Wt 140 lb (63.504 kg)  BMI 20.67 kg/m2  SpO2 98% CONSTITUTIONAL: Alert and oriented and responds appropriately to questions. Well-appearing; well-nourished, appears anxious but is not in any distress HEAD: Normocephalic EYES: Conjunctivae clear, PERRL ENT: normal nose; no rhinorrhea; moist mucous membranes; pharynx without lesions noted NECK: Supple, no meningismus, no LAD  CARD: RRR; S1 and S2 appreciated; no murmurs, no clicks, no rubs, no gallops RESP: Normal chest excursion without splinting or tachypnea; breath sounds clear and equal bilaterally; no wheezes, no rhonchi, no rales, no hypoxia or respiratory distress, speaking full sentences ABD/GI: Normal bowel sounds; non-distended; soft, non-tender, no rebound, no guarding BACK:  The back appears normal and is diffusely tender to palpation throughout the lumbar paraspinal musculature but no midline spinal stenosis or step-off deformity, no CVA tenderness EXT: Normal ROM in all joints; non-tender to palpation; no edema; normal capillary refill; no cyanosis, no Tenderness or swelling, 2+ DP and radial pulses bilaterally, no edema or swelling of her hands and feet on exam    SKIN: Normal  color for age and race; warm NEURO: Moves all extremities equally, sensation to light touch intact diffusely, cranial nerves II through XII intact, normal gait PSYCH: The patient's mood and manner are appropriate. Grooming and personal hygiene are appropriate. Denies SI or HI.  MEDICAL DECISION MAKING: Patient here with multiple symptoms after using methamphetamine. She is  hemodynamically stable, neurologically intact. We'll check basic labs, urine, EKG, chest x-ray. We'll give IM Ativan and Toradol. We'll encourage oral fluids. Suspect discharge home if workup unremarkable.  ED PROGRESS: Patient's labs show mild leukocytosis without left shift. Urine shows trace hemoglobin and few bacteria but also many squamous cells no other sign of infection. Urine drug positive for amphetamines and THC. Troponin negative. Alcohol negative. Chest x-ray clear. Patient reports feeling better after Toradol and Ativan. States she does have a history of panic attacks and thinks that she began panicking after using methamphetamine. Have offered her resources for detox/rehabilitation. Have advised her to avoid using illicit substances in the future. Discussed return precautions. Patient verbalizes understanding and is comfortable with plan.      EKG Interpretation  Date/Time:  Wednesday May 16 2014 05:21:04 EST Ventricular Rate:  68 PR Interval:  177 QRS Duration: 85 QT Interval:  402 QTC Calculation: 427 R Axis:   73 Text Interpretation:  Sinus rhythm No significant change was found Confirmed by WARD,  DO, KRISTEN [phone]) on 05/16/2014 5:26:52 AM        Layla Maw Ward, DO 05/16/14 [id number]

## 2014-05-16 NOTE — Discharge Instructions (Signed)
Polysubstance Abuse When people abuse more than one drug or type of drug it is called polysubstance or polydrug abuse. For example, many smokers also drink alcohol. This is one form of polydrug abuse. Polydrug abuse also refers to the use of a drug to counteract an unpleasant effect produced by another drug. It may also be used to help with withdrawal from another drug. People who take stimulants may become agitated. Sometimes this agitation is countered with a tranquilizer. This helps protect against the unpleasant side effects. Polydrug abuse also refers to the use of different drugs at the same time.  Anytime drug use is interfering with normal living activities, it has become abuse. This includes problems with family and friends. Psychological dependence has developed when your mind tells you that the drug is needed. This is usually followed by physical dependence which has developed when continuing increases of drug are required to get the same feeling or "high". This is known as addiction or chemical dependency. A person's risk is much higher if there is a history of chemical dependency in the family. SIGNS OF CHEMICAL DEPENDENCY  You have been told by friends or family that drugs have become a problem.  You fight when using drugs.  You are having blackouts (not remembering what you do while using).  You feel sick from using drugs but continue using.  You lie about use or amounts of drugs (chemicals) used.  You need chemicals to get you going.  You are suffering in work performance or in school because of drug use.  You get sick from use of drugs but continue to use anyway.  You need drugs to relate to people or feel comfortable in social situations.  You use drugs to forget problems. "Yes" answered to any of the above signs of chemical dependency indicates there are problems. The longer the use of drugs continues, the greater the problems will become. If there is a family history of  drug or alcohol use, it is best not to experiment with these drugs. Continual use leads to tolerance. After tolerance develops more of the drug is needed to get the same feeling. This is followed by addiction. With addiction, drugs become the most important part of life. It becomes more important to take drugs than participate in the other usual activities of life. This includes relating to friends and family. Addiction is followed by dependency. Dependency is a condition where drugs are now needed not just to get high, but to feel normal. Addiction cannot be cured but it can be stopped. This often requires outside help and the care of professionals. Treatment centers are listed in the yellow pages under: Cocaine, Narcotics, and Alcoholics Anonymous. Most hospitals and clinics can refer you to a specialized care center. Talk to your caregiver if you need help. Document Released: 10/15/2004 Document Revised: 05/18/2011 Document Reviewed: 02/23/2005 [facility] Patient Information 2015 [place], [place]. This information is not intended to replace advice given to you by your health care provider. Make sure you discuss any questions you have with your health care provider.  Stimulant Use Disorder-Methamphetamines Methamphetamines are one of a group of powerful drugs known as stimulants. Methamphetamine is a form of amphetamine. It is rarely used medically but is often misused because of the effects it produces. These effects include: A feeling of extreme pleasure (euphoria). Alertness. High energy. Increased sexuality. Common street names for methamphetamine are meth, crystal, ice, glass, and chalk. This drug can be taken by mouth, smoked, snorted, or dissolved  in water and injected. Stimulants are addictive because they activate regions of the brain that are responsible for producing both the pleasurable sensation of "reward" and psychological dependence. Together, these actions account for loss of control and  the rapid development of drug dependence. This means the user will become ill without the drug (withdrawal) and will need to keep using it to function.  Stimulant use disorder is use of stimulants that disrupts your daily life. It disrupts relationships with family and friends and how you do your job. Methamphetamine increases blood pressure and heart rate. Use can cause a heart attack or stroke. Methamphetamine can also cause death from irregular heart rate or seizures. SIGNS AND SYMPTOMS  Signs and symptoms of stimulant use disorder with methamphetamine include: Use of methamphetamines in larger amounts or over a longer period than intended. Unsuccessful attempts to cut down or control methamphetamine use. A lot of time spent obtaining, using, or recovering from the effects of methamphetamines. A strong desire or urge to use methamphetamines (craving). Continued use of methamphetamines in spite of major problems at work, school, or home because of use. Continued use of methamphetamines in spite of relationship problems because of use. Giving up or cutting down on important life activities because of use of methamphetamines. Use of methamphetamines over and over in situations when it is physically hazardous, such as driving a car. Continued use of methamphetamines in spite of a physical problem that is likely related to use. Physical problems can include: Extreme weight loss. Malnutrition. Jaw clenching. Severe dental problems (meth mouth). Lung problems (due to smoking). Skin sores (due to scratching). Infections such as human immunodeficiency virus (HIV) and hepatitis (from injecting methamphetamines). Continued use of methamphetamines in spite of a mental problem that is likely related to use. Mental problems can include: Memory problems. Schizophrenia-like symptoms. Depression. Bipolar mood swings. Violent behavior. Anxiety. Sleep problems. Need to use more and more methamphetamines  to get the same effect, or lessened effect over time with use of the same amount (tolerance). Having withdrawal symptoms when methamphetamine use is stopped, or using methamphetamines to reduce or avoid withdrawal symptoms. Withdrawal symptoms include: Depressed or irritable mood. Low energy or restlessness. Bad dreams. Too little or too much sleep. Increased appetite. DIAGNOSIS Stimulant use disorder is diagnosed by your health care provider. You may be asked questions about your methamphetamine use and how it affects your life. A physical exam may be done. A drug screen may be ordered. You may be referred to a mental health professional. The diagnosis of stimulant use disorder requires at least two symptoms within 12 months. The type of stimulant use disorder depends on the number of symptoms you have. The type may be: Mild. Two or three signs and symptoms. Moderate. Four or five signs and symptoms. Severe. Six or more signs and symptoms. TREATMENT  There are two types of treatment:  Short-term (urgent) medical treatment. This helps to preserve life and prevent or minimize damage from the physical or mental problems related to methamphetamine use.  Long-term substance abuse treatment. This focuses on recovery from use disorder. It is provided by mental health professionals who have training in substance use disorders. It is usually a combination of counseling, support groups, and nonaddictive medicines that can reduce cravings or block the effects of methamphetamine. HOME CARE INSTRUCTIONS  Take medicines only as directed by your health care provider. Identify the people and activities that trigger your methamphetamine use and avoid them. Keep all follow-up visits as directed by  your health care provider. SEEK MEDICAL CARE IF: Your symptoms get worse or you relapse. You are not able to take medicines as directed.  SEEK IMMEDIATE MEDICAL CARE IF:  You have serious thoughts about hurting  yourself or others. You have a seizure, chest pain, sudden weakness, or loss of speech or vision. FOR MORE INFORMATION National Institute on Drug Abuse: http://www.price-smith.com/ Substance Abuse and Mental Health Services Administration: SkateOasis.com.pt Document Released: 10/30/2003 Document Revised: 07/10/2013 Document Reviewed: 03/08/2013 [facility] Patient Information 2015 [place], [place]. This information is not intended to replace advice given to you by your health care provider. Make sure you discuss any questions you have with your health care provider.     Emergency Department Resource Guide 1) Find a Doctor and Pay Out of Pocket Although you won't have to find out who is covered by your insurance plan, it is a good idea to ask around and get recommendations. You will then need to call the office and see if the doctor you have chosen will accept you as a new patient and what types of options they offer for patients who are self-pay. Some doctors offer discounts or will set up payment plans for their patients who do not have insurance, but you will need to ask so you aren't surprised when you get to your appointment.  2) Contact Your Local Health Department Not all health departments have doctors that can see patients for sick visits, but many do, so it is worth a call to see if yours does. If you don't know where your local health department is, you can check in your phone book. The CDC also has a tool to help you locate your state's health department, and many state websites also have listings of all of their local health departments.  3) Find a Walk-in Clinic If your illness is not likely to be very severe or complicated, you may want to try a walk in clinic. These are popping up all over the country in pharmacies, drugstores, and shopping centers. They're usually staffed by nurse practitioners or physician assistants that have been trained to treat common illnesses and complaints. They're usually  fairly quick and inexpensive. However, if you have serious medical issues or chronic medical problems, these are probably not your best option.  No Primary Care Doctor: - Call Health Connect at  [phone] - they can help you locate a primary care doctor that  accepts your insurance, provides certain services, etc. - Physician Referral Service- [phone]  Chronic Pain Problems: Organization         Address  Phone   Notes  [facility]  [phone] Patients need to be referred by their primary care doctor.   Medication Assistance: Organization         Address  Phone   Notes  [facility] Medication [facility] [address] [place], [place] [address] [phone] --Must be a resident of [facility] -- Must have NO insurance coverage whatsoever (no Medicaid/ Medicare, etc.) -- The pt. MUST have a primary care doctor that directs their care regularly and follows them in the community   [facility]  [phone]   [employer]  [phone]    Agencies that provide inexpensive medical care: Organization         Address  Phone   Notes  [facility]  [phone]   [facility]    [phone]   [facility] Outpatient  Clinic [address] [place], [place] [id number] [phone]   [facility] [address], [place] [phone]) [phone]   [facility]anned [facility]    [phone]   [facility]    [phone]   [facility] and [facility]  [address], [place] Phone:  [phone], Fax:  [phone] Hours of Operation:  9 am - 6 pm, M-F.  Also accepts Medicaid/Medicare and self-pay.  [facility]  [address], [place] Phone: [phone](336) [phone], Fax: [phone]. Hours of Operation:  8:30 am - 5:30 pm, M-F.  Also accepts Medicaid and self-pay.  [facility] [place] [address], [place] Phone: [phone])  [phone]   [facility] [address], [place], [place] [phone], Ext. [phone] Mondays & Thursdays: 7-9 AM.  First 15 patients are seen on a first come, first serve basis.    Medicaid-accepting [facility] Providers:  Organization         Address  Phone   Notes  [facility] [address], [place] [phone] Also accepts self-pay patients.  [facility] [address] [place], [place]  [phone]) [phone]   [facility] [address], [place] [phone]   [facility] [address] [place] [address], [place] [phone]) [phone]   Renaye RakersVeita Bland [address], [place]   [phone](336) [phone] Only accepts [place] Access [place] patients after they have their name applied to their card.   Self-Pay (no insurance) in [facility] [place]:  Organization         Address  Phone   Notes  Sickle Cell Patients, [facility] [address], [place] [phone]) [phone]   [facility] [address], [place] [phone]) [phone]   [facility] [place]  [address], [place] [phone]) [phone]   [facility]/Dr. Osei-Bonsu  [address] [place] [address], [place] or [phone], [place] [phone] Phone number for both [place] and [place] locations is the same.  [facility] [address], [place] [phone]) [phone]   [facility] [place] [address] [place] [address], [place] or [address] [phone]) [phone]   [facility] [address], [place] [phone]) [phone], phone; [phone]) [phone], fax Sees patients 1st and 3rd Saturday of every month.  Must not qualify for public or private insurance (i.e. Medicaid, Medicare, [place] Health Choice, Veterans' Benefits)  Household income should be no more than 200% of the poverty level The clinic cannot treat you if  you are pregnant or think you are pregnant  Sexually transmitted diseases are not treated at the clinic.    Dental Care: Organization         Address  Phone  Notes  [facility] of [facility] [address], [place] [phone]) [phone] Accepts children up to age 34 who are enrolled in [place] or [place] Health Choice; pregnant women with a Medicaid card; and children who have applied for Medicaid or [place] Health Choice, but were declined, whose parents can pay a reduced fee at time of service.  [facility] of [facility] [place]  [address], [place] [phone] Accepts children up to age 34 who are enrolled in  Medicaid or [place] Health Choice; pregnant women with a Medicaid card; and children who have applied for Medicaid or [place] Health Choice, but were declined, whose parents can pay a reduced fee at time of service.  [facility]  [address] [place], [place] [phone] Patients are seen by appointment only. Walk-ins are not accepted. [facility] will see patients 66 years of age and older. Monday - Tuesday (8am-5pm) Most Wednesdays (8:30-5pm) $30 per visit, cash only  [facility] Adult [facility] Access PRO[facility]  [address], [facility] [phone] Patients are seen by appointment only. Walk-ins are not accepted. [facility] will see patients 50 years of age and older. One Wednesday Evening (Monthly: Volunteer Based).  $30 per visit, cash only  [employer]  [phone] for adults; Children under age 33, call Graduate Pediatric Dentistry at [phone]. Children aged 24-14, please call [phone] to request a pediatric application.  Dental services are provided in all areas of dental care including fillings, crowns and bridges, complete and partial dentures, implants, gum treatment, root canals, and extractions. Preventive care is also provided. Treatment is  provided to both adults and children. Patients are selected via a lottery and there is often a waiting list.   [facility] [address], [place]  [phone] [email or web address]   [facility] [address] [place], [place] [phone], Ext. [phone] Second and Fourth Thursday of each month, opens at 6:30 AM; Clinic ends at 9 AM.  Patients are seen on a first-come first-served basis, and a limited number are seen during each clinic.   [facility]  [address] [place], [place] [phone]   Eligibility Requirements You must have lived in [place], [place], or [place] counties for at least the last three months.   You cannot be eligible for state or federal sponsored [place], including [employer], [place], or [employer].   You generally cannot be eligible for healthcare insurance through your employer.    How to apply: Eligibility screenings are held every Tuesday and Wednesday afternoon from 1:00 pm until 4:00 pm. You do not need an appointment for the interview!  [facility] [address] [place], [place] [phone]   [facility] Health Department  [phone]   [facility] Health Department  [phone]   [facility] Health Department  [phone]    Behavioral Health Resources in the Community: Intensive Outpatient Programs Organization         Address  Phone  Notes  [facility] Services [address] [place], [place] [phone]   [facility] Outpatient [address], [place], [place] [phone]   [facility] [address] [place], [place]  [phone]   [facility] Mental Health [address]  [place], [place] [phone] or [phone]   Substance Abuse Resources Organization         Address  Phone  Notes  Alcohol [facility] Drug Services  [phone]   [facility]  [phone]   The  [place]  [phone]   Floydene Flock  [phone]   Residential & Outpatient Substance Abuse Program  [phone]   Psychological Services Organization         Address  Phone  Notes  [facility] Behavioral Health  [phone]   [facility] Services  [phone]   [facility] Mental Health [address] [place] [phone] or  [phone]    Mobile Crisis Teams Organization         Address  Phone  Notes  [facility], Mobile Crisis Care Unit  [phone]   [facility]  [address] [place], [place] [phone]   [facility] [address] [place] [phone]    Self-Help/Support Groups Organization         Address  Phone             Notes  [facility] of [place] - variety of support groups  [phone]- I7437963 Call for more information  Narcotics Anonymous (NA), [facility] [address], [employer] [place]  2 meetings at this location   Statistician         Address  Phone  Notes  [facility] [address],    [place]  [phone]   [facility]  [address] [place] [address], [place], [place] [phone]   [facility] Treatment Facility [address] [place], [place] [phone] Admissions: 8am-3pm M-F  [facility] [address]    [place], [place] [phone]   [facility] [address] [place], [place], [place] [phone]   [facility] [address],  [place], [place] [phone]   [facility] - Intensive Outpatient [address], [place], [place] [phone]   [facility] [address]  [place], [place] [phone] or [phone]   [facility] ([facility]) [address] [place], [place] [phone] Accepts Medicaid  [facility] [place] [address]  [place] [phone] Substance Abuse/Addiction Treatment     [facility] Organization         Address  Phone  Notes  [facility]  [phone]   Angie Fava, PhD [address] [place], [place]   [phone] or [phone]   [facility] Behavioral   [address] [place], [place] [phone]   [facility] [address] [place], [place] [phone] Insurance/Medicaid/sponsorship through [facility] and Families [address]                                    [place], [place] [phone] Therapy/tele-psych/case  [facility] [address], [place] [phone]    Dr. Lolly Mustache  [phone]   [facility]  [employer] [facility] Dept. 1) [address], [place] 2) [address], [place] 3)  [address], [place] [phone]  [phone]   [facility] Child Abuse Hotline [phone] or [phone] (After Hours)

## 2014-05-16 NOTE — ED Notes (Addendum)
Pt c/o back pain, finger numbness, eyes twitching, pt states she does not feel right. Pt states since she done meth today she does not feel well.

## 2014-05-16 NOTE — ED Notes (Signed)
Pt. Denies suicidal ideation. Pt. Stating "I don't want to kill myself, that's why I'm here".

## 2014-05-16 NOTE — ED Notes (Signed)
Pt. Requesting drug screen. Pt. Stating "I think they gave me something else, I am an IV drug user and as soon as I shot up it felt different. I mean look at my hear rate its low, my heart should be racing after the meth."

## 2014-07-31 IMAGING — CT CT ORBITS W/ CM
3 of 4 series · 14 of 47 positions shown, 17 images · IV contrast (Omnipaque 300)
Comparison: CT head without contrast 07/15/2012.

CLINICAL DATA: Left infraorbital abscess.  Skin lesion.

CT ORBITS WITH CONTRAST
TECHNIQUE: Multidetector CT imaging of the orbits was performed
following the bolus administration of intravenous contrast.
Contrast: 75mL OMNIPAQUE IOHEXOL 300 MG/ML  SOLN

[Series 2: max st axial · axial · 0.27mm/px · z∈[+114,+194]mm · 9 of 48 slices shown, 12 images]
[im 4/48  brain]
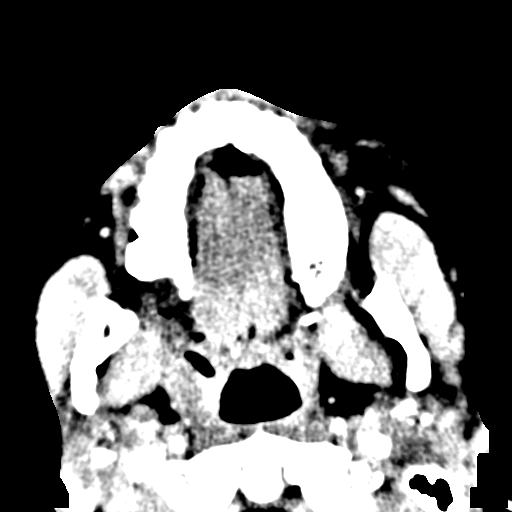
[im 4/48  bone]
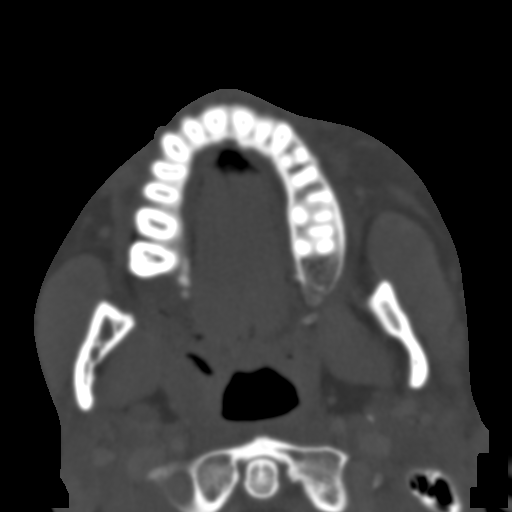
[im 9/48  bone]
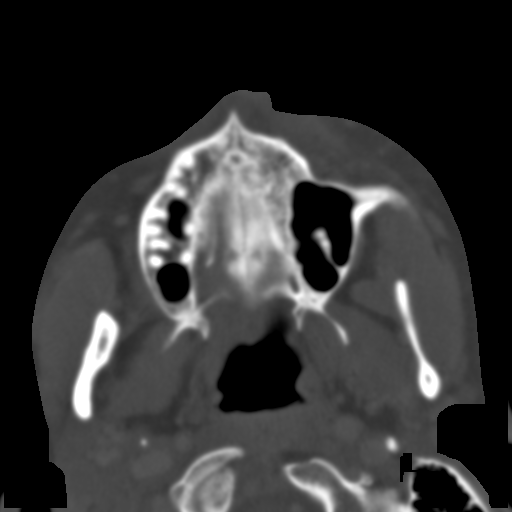
[im 13/48  bone]
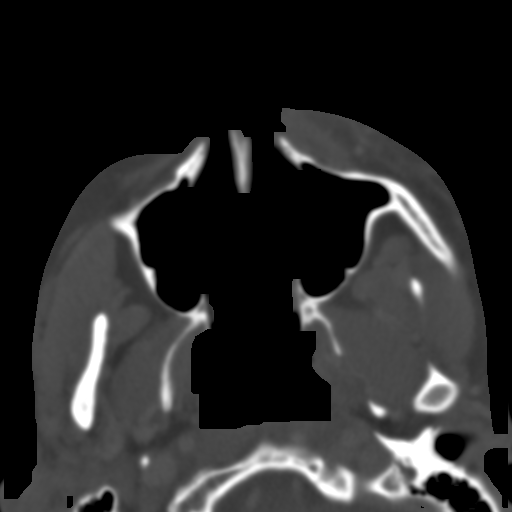
[im 18/48  bone]
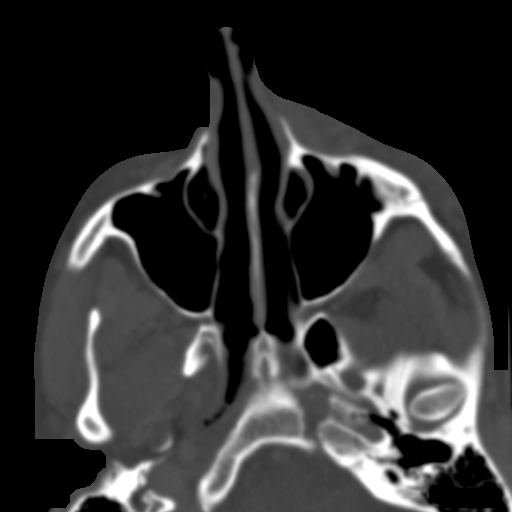
[im 25/48  brain]
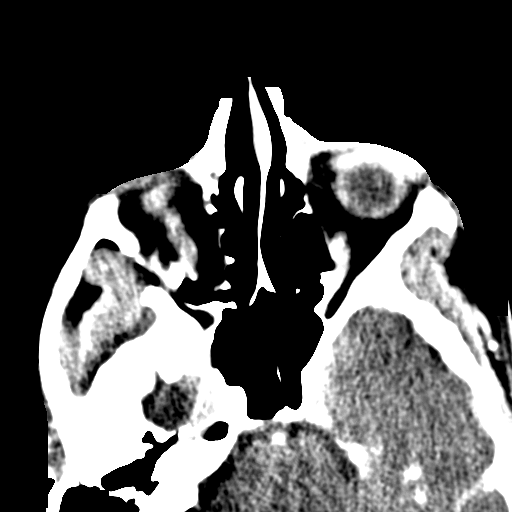
[im 25/48  bone]
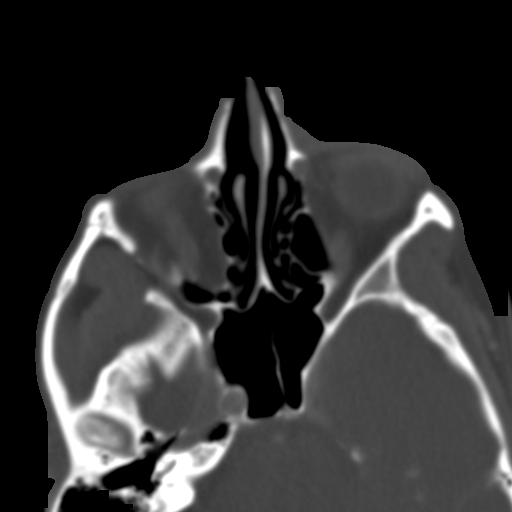
[im 30/48  bone]
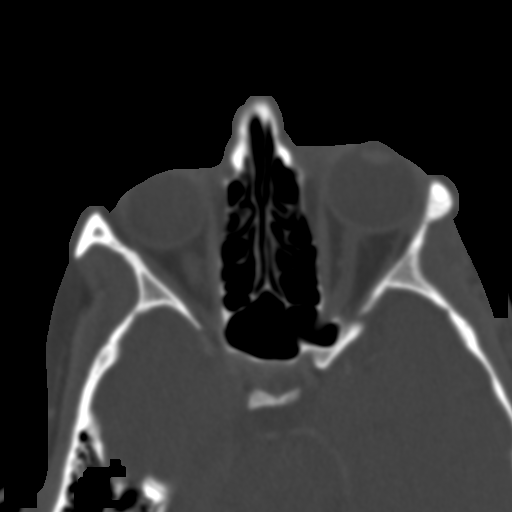
[im 35/48  bone]
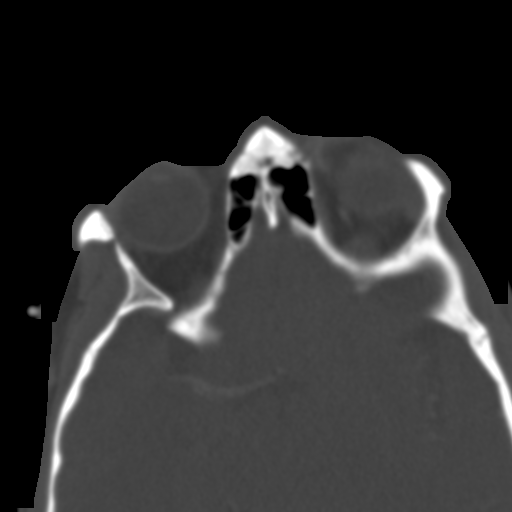
[im 39/48  bone]
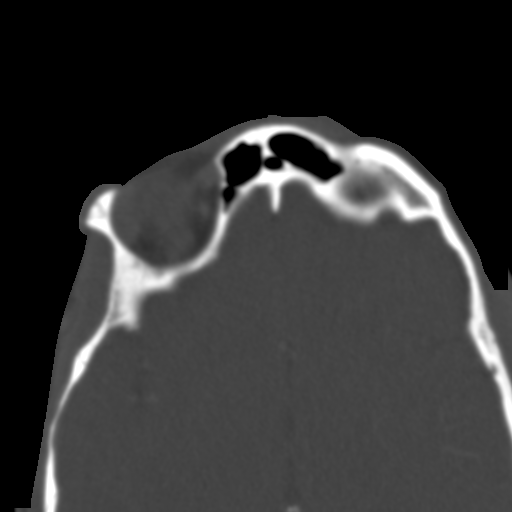
[im 44/48  brain]
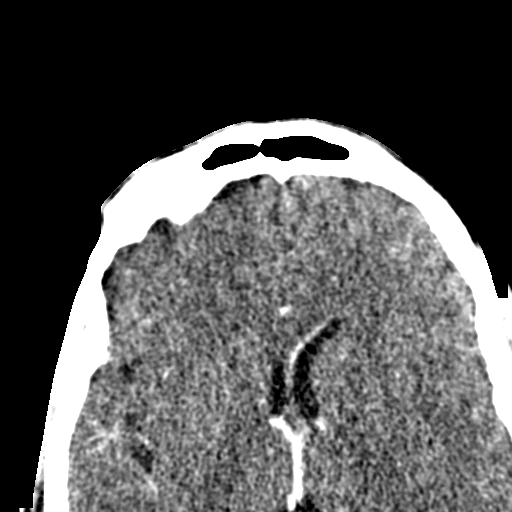
[im 44/48  bone]
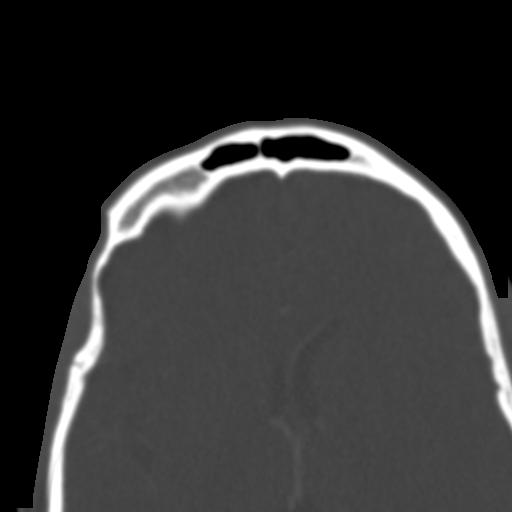

[Series 4: max st coro · coronal · 0.19mm/px · 3 of 67 slices shown]
[im 23/67  bone]
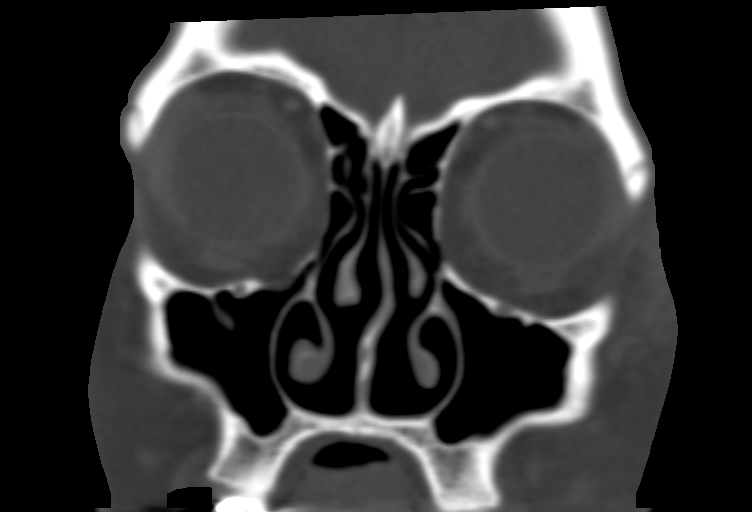
[im 30/67  bone]
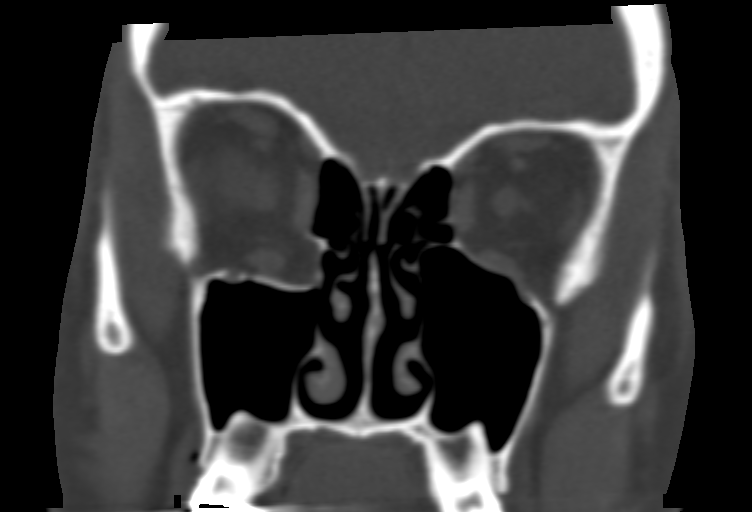
[im 37/67  bone]
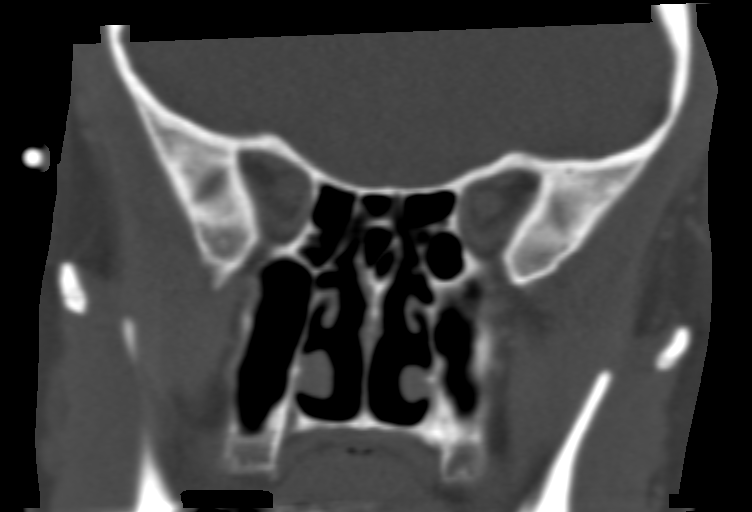

[Series 7: max bone sag · sagittal · 0.19mm/px · 2 of 93 slices shown]
[im 31/93  bone]
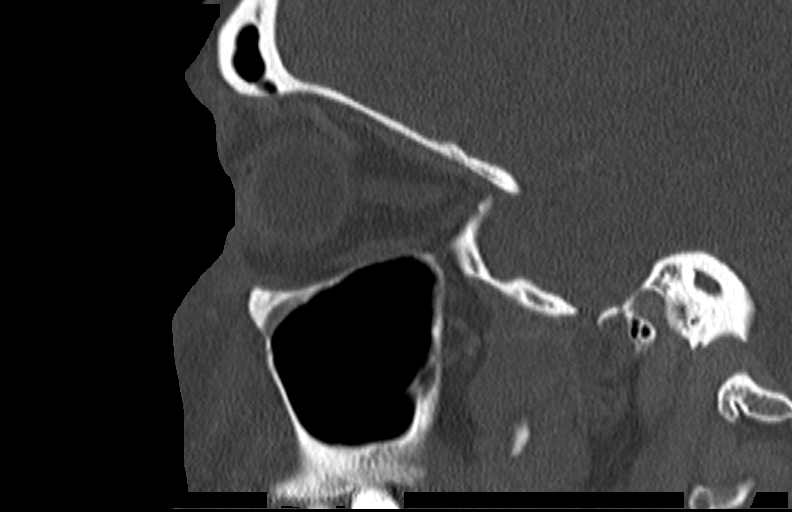
[im 62/93  bone]
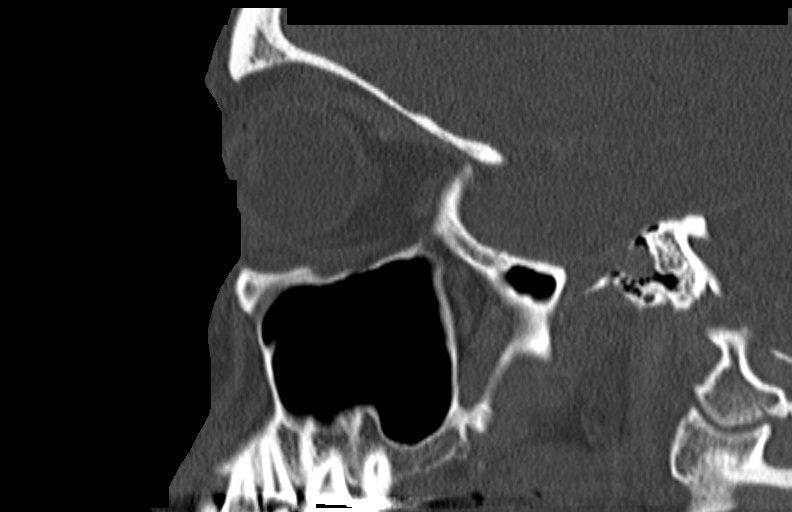

[14 of 47 positions shown; findings below may reference images not displayed]

FINDINGS: Extensive left infraorbital edematous changes and
subcutaneous stranding is present.  There is some skin thickening
as well.  This is compatible with a cellulitis.  No discrete
abscess is evident.  There is mild edema extending into the left
upper lid and supraorbital region as well.  Asymmetric edematous
changes are noted along the left side of the nose.

The globe is within normal limits.  The orbits are within normal
limits bilaterally.  Limited imaging the brain is unremarkable.
The optic chiasm is normal.  The sella is unremarkable.
IMPRESSION: 1.  Left infraorbital soft tissue swelling and infiltration
compatible with cellulitis without evidence for a discrete abscess.
2.  Normal appearance of the left globe and orbit.

## 2014-08-10 ENCOUNTER — Encounter (HOSPITAL_COMMUNITY): Payer: Self-pay | Admitting: Emergency Medicine

## 2014-08-10 ENCOUNTER — Emergency Department (HOSPITAL_COMMUNITY)
Admission: EM | Admit: 2014-08-10 | Discharge: 2014-08-10 | Disposition: A | Discharge status: Home / Self Care | Payer: Medicaid Other | Attending: Emergency Medicine | Admitting: Emergency Medicine

## 2014-08-10 DIAGNOSIS — Z720 Tobacco use: Secondary | ICD-10-CM | POA: Insufficient documentation

## 2014-08-10 DIAGNOSIS — R42 Dizziness and giddiness: Secondary | ICD-10-CM | POA: Insufficient documentation

## 2014-08-10 DIAGNOSIS — Z8659 Personal history of other mental and behavioral disorders: Secondary | ICD-10-CM | POA: Insufficient documentation

## 2014-08-10 DIAGNOSIS — Z8614 Personal history of Methicillin resistant Staphylococcus aureus infection: Secondary | ICD-10-CM | POA: Insufficient documentation

## 2014-08-10 LAB — I-STAT CHEM 8, ED
BUN: 12 mg/dL (ref 6–20)
Calcium, Ion: 1.17 mmol/L (ref 1.12–1.23)
Chloride: 104 mmol/L (ref 101–111)
Creatinine, Ser: 0.7 mg/dL (ref 0.44–1.00)
Glucose, Bld: 97 mg/dL (ref 65–99)
HCT: 38 % (ref 36.0–46.0)
Hemoglobin: 12.9 g/dL (ref 12.0–15.0)
Potassium: 3.5 mmol/L (ref 3.5–5.1)
Sodium: 139 mmol/L (ref 135–145)
TCO2: 21 mmol/L (ref 0–100)

## 2014-08-10 MED ORDER — LORAZEPAM 1 MG PO TABS
1.0000 mg | ORAL_TABLET | Freq: Once | ORAL | Status: AC
  Administered 2014-08-10: 1 mg via ORAL
  Filled 2014-08-10: qty 1

## 2014-08-10 NOTE — ED Provider Notes (Signed)
CSN: [id number]     Arrival date & time 08/10/14  1338 History   First MD Initiated Contact with Patient 08/10/14 1414     Chief Complaint  Patient presents with  . Leg Swelling     (Consider location/radiation/quality/duration/timing/severity/associated sxs/prior Treatment) HPI   33yF with numerous complaints. Was passenger in car shortly before arrival when she began to "feel dizzy." When asked further she has a hard time describing. "I don't know. I was just dizzy." also reports that R knee and proximal thigh turned blue/purple and then faded over the period of a few minutes. No pain, but often gets stinging pain in hand and feet/legs. "Like bee stings." No respiratory complaints. No acute visual changes. No CP. Hx of substance abuse but states last ingestion was adderall two days ago.   Past Medical History  Diagnosis Date  . Bipolar 1 disorder   . Anxiety   . MRSA (methicillin resistant Staphylococcus aureus)   . Heroin abuse   . Tobacco abuse    Past Surgical History  Procedure Laterality Date  . Eye surgery    . Arm    . Tubal ligation     History reviewed. No pertinent family history. History  Substance Use Topics  . Smoking status: Current Every Day Smoker -- 1.00 packs/day  . Smokeless tobacco: Not on file  . Alcohol Use: Yes     Comment: Denies today (09/07/12)   OB History    No data available     Review of Systems  All systems reviewed and negative, other than as noted in HPI.   Allergies  Review of patient's allergies indicates no known allergies.  Home Medications   Prior to Admission medications   Not on File   BP 123/87 mmHg  Pulse 76  Temp(Src) 98.1 F (36.7 C) (Oral)  Resp 18  Ht 5\' 9"  (1.753 m)  Wt 145 lb (65.772 kg)  BMI 21.40 kg/m2  SpO2 100%  LMP 07/25/2014 Physical Exam  Constitutional: She is oriented to person, place, and time. She appears well-developed and well-nourished. No distress.  HENT:  Head: Normocephalic and  atraumatic.  Eyes: Conjunctivae are normal. Right eye exhibits no discharge. Left eye exhibits no discharge.  Neck: Neck supple.  Cardiovascular: Normal rate, regular rhythm and normal heart sounds.  Exam reveals no gallop and no friction rub.   No murmur heard. Pulmonary/Chest: Effort normal and breath sounds normal. No respiratory distress.  Abdominal: Soft. She exhibits no distension. There is no tenderness.  Musculoskeletal: She exhibits no edema or tenderness.  Neurological: She is alert and oriented to person, place, and time. No cranial nerve deficit. She exhibits normal muscle tone. Coordination normal.  Skin: Skin is warm and dry.  Psychiatric: She has a normal mood and affect. Her behavior is normal. Thought content normal.  Very restless. Changing position in bed and sitting up then laying down frequently. Scratching head, neck and legs often. Speech is clear. Content appropriate but somewhat rambling thoughts.   Nursing note and vitals reviewed.   ED Course  Procedures (including critical care time) Labs Review Labs Reviewed  I-STAT CHEM 8, ED    Imaging Review No results found.   EKG Interpretation   Date/Time:  Friday August 10 2014 14:59:48 EDT Ventricular Rate:  60 PR Interval:  179 QRS Duration: 86 QT Interval:  449 QTC Calculation: 449 R Axis:   77 Text Interpretation:  Sinus rhythm No significant change was found  Confirmed by Juleen ChinaKOHUT  MD,  Deloyd Handy ([id number]) on 08/10/2014 3:59:58 PM      MDM   Final diagnoses:  Dizziness and giddiness    33yF with numerous complaints, many of which seemingly are unrelated. Reports swelling in LE but I cannot appreciate any on my exam. No signs of DVT. Heart/lung exams normal. Nonfocal neuro exam. Anxious/fidgety and suspect she may have recently taken methamphetamines although she denies using in the past few days. Low suspicion for emergent process. Reassurance provided. Outpt FU otherwise.     Raeford Razor, MD 08/10/14  [phone]

## 2014-08-10 NOTE — ED Notes (Addendum)
Pt reports intermittent dizzy spells and leg swelling for several weeks. Moderate anxiety noted in triage.

## 2015-02-12 ENCOUNTER — Emergency Department (HOSPITAL_COMMUNITY)
Admission: EM | Admit: 2015-02-12 | Discharge: 2015-02-12 | Disposition: A | Discharge status: Home / Self Care | Payer: Medicaid Other | Attending: Emergency Medicine | Admitting: Emergency Medicine

## 2015-02-12 ENCOUNTER — Encounter (HOSPITAL_COMMUNITY): Payer: Self-pay | Admitting: Emergency Medicine

## 2015-02-12 ENCOUNTER — Emergency Department (HOSPITAL_COMMUNITY): Payer: Medicaid Other

## 2015-02-12 DIAGNOSIS — R519 Headache, unspecified: Secondary | ICD-10-CM

## 2015-02-12 DIAGNOSIS — Z3202 Encounter for pregnancy test, result negative: Secondary | ICD-10-CM | POA: Insufficient documentation

## 2015-02-12 DIAGNOSIS — R51 Headache: Secondary | ICD-10-CM | POA: Insufficient documentation

## 2015-02-12 DIAGNOSIS — Z8619 Personal history of other infectious and parasitic diseases: Secondary | ICD-10-CM | POA: Insufficient documentation

## 2015-02-12 DIAGNOSIS — Z8614 Personal history of Methicillin resistant Staphylococcus aureus infection: Secondary | ICD-10-CM | POA: Insufficient documentation

## 2015-02-12 DIAGNOSIS — N3001 Acute cystitis with hematuria: Secondary | ICD-10-CM | POA: Insufficient documentation

## 2015-02-12 DIAGNOSIS — F17200 Nicotine dependence, unspecified, uncomplicated: Secondary | ICD-10-CM | POA: Insufficient documentation

## 2015-02-12 DIAGNOSIS — Z8659 Personal history of other mental and behavioral disorders: Secondary | ICD-10-CM | POA: Insufficient documentation

## 2015-02-12 HISTORY — DX: Unspecified viral hepatitis C without hepatic coma: B19.20

## 2015-02-12 LAB — COMPREHENSIVE METABOLIC PANEL
ALBUMIN: 4.1 g/dL (ref 3.5–5.0)
ALT: 32 U/L (ref 14–54)
AST: 22 U/L (ref 15–41)
Alkaline Phosphatase: 50 U/L (ref 38–126)
Anion gap: 6 (ref 5–15)
BUN: 15 mg/dL (ref 6–20)
CHLORIDE: 103 mmol/L (ref 101–111)
CO2: 29 mmol/L (ref 22–32)
Calcium: 9.2 mg/dL (ref 8.9–10.3)
Creatinine, Ser: 0.79 mg/dL (ref 0.44–1.00)
GFR calc Af Amer: >60 mL/min (ref >60)
GFR calc non Af Amer: >60 mL/min (ref >60)
GLUCOSE: 81 mg/dL (ref 65–99)
POTASSIUM: 3.5 mmol/L (ref 3.5–5.1)
Sodium: 138 mmol/L (ref 135–145)
Total Bilirubin: 0.5 mg/dL (ref 0.3–1.2)
Total Protein: 7.4 g/dL (ref 6.5–8.1)

## 2015-02-12 LAB — CBC WITH DIFFERENTIAL/PLATELET
BASOS ABS: 0 10*3/uL (ref 0.0–0.1)
BASOS PCT: 0 %
EOS PCT: 2 %
Eosinophils Absolute: 0.2 10*3/uL (ref 0.0–0.7)
HCT: 45.6 % (ref 36.0–46.0)
Hemoglobin: 15.2 g/dL — ABNORMAL HIGH (ref 12.0–15.0)
Lymphocytes Relative: 21 %
Lymphs Abs: 2.3 10*3/uL (ref 0.7–4.0)
MCH: 30.3 pg (ref 26.0–34.0)
MCHC: 33.3 g/dL (ref 30.0–36.0)
MCV: 91 fL (ref 78.0–100.0)
MONO ABS: 0.8 10*3/uL (ref 0.1–1.0)
Monocytes Relative: 8 %
NEUTROS ABS: 7.5 10*3/uL (ref 1.7–7.7)
Neutrophils Relative %: 69 %
PLATELETS: 297 10*3/uL (ref 150–400)
RBC: 5.01 MIL/uL (ref 3.87–5.11)
RDW: 14 % (ref 11.5–15.5)
WBC: 10.8 10*3/uL — AB (ref 4.0–10.5)

## 2015-02-12 LAB — URINALYSIS, ROUTINE W REFLEX MICROSCOPIC
Bilirubin Urine: NEGATIVE
GLUCOSE, UA: NEGATIVE mg/dL
KETONES UR: NEGATIVE mg/dL
Nitrite: POSITIVE — AB
PROTEIN: NEGATIVE mg/dL
Specific Gravity, Urine: 1.025 (ref 1.005–1.030)
pH: 5.5 (ref 5.0–8.0)

## 2015-02-12 LAB — URINE MICROSCOPIC-ADD ON

## 2015-02-12 LAB — PREGNANCY, URINE: PREG TEST UR: NEGATIVE

## 2015-02-12 MED ORDER — CEPHALEXIN 500 MG PO CAPS: 500.0000 mg | ORAL_CAPSULE | Freq: Four times a day (QID) | ORAL | Status: DC

## 2015-02-12 MED ORDER — METOCLOPRAMIDE HCL 10 MG PO TABS
10.0000 mg | ORAL_TABLET | Freq: Once | ORAL | Status: AC
  Administered 2015-02-12: 10 mg via ORAL
  Filled 2015-02-12: qty 1

## 2015-02-12 MED ORDER — IBUPROFEN 400 MG PO TABS
600.0000 mg | ORAL_TABLET | Freq: Once | ORAL | Status: AC
  Administered 2015-02-12: 600 mg via ORAL
  Filled 2015-02-12: qty 2

## 2015-02-12 NOTE — ED Provider Notes (Signed)
CSN: [id number]     Arrival date & time 02/12/15  [id number] History   First MD Initiated Contact with Patient 02/12/15 [phone]     Chief Complaint  Patient presents with  . Headache     (Consider location/radiation/quality/duration/timing/severity/associated sxs/prior Treatment) HPI 34 year old female who presents with headache. History of bipolar disorder and polysubstance abuse. States that she has had a headache for one week. Does not recall how bad the headache was or what she was doing during onset of headache. States that she normally does not have headache. Has had subjective fever chills over past 2 days with mild congestion and runny nose. She also states that she thinks she is dehydrated as her urine is dark in appearance. Has mild dysuria and states her urine also seems to have a odor. Her friend says that her speech has been funny throughout this week, but is unable to elaborate in what way it is different than usual. Also states that intermittently she sees squiggly lines in her visual field and this makes her vision blurry. No eye pain or double vision. A/w nausea but no vomiting. No diarrhea, abdominal pain, urinary frequency.  States that she last injected cocaine this morning, prior to coming to the emergency department. She has pressured speech, and is hard to redirect with questioning.  Past Medical History  Diagnosis Date  . Bipolar 1 disorder (HCC)   . Anxiety   . MRSA (methicillin resistant Staphylococcus aureus)   . Heroin abuse   . Tobacco abuse   . Hepatitis C    Past Surgical History  Procedure Laterality Date  . Eye surgery    . Arm    . Tubal ligation    . Cholecystectomy     History reviewed. No pertinent family history. Social History  Substance Use Topics  . Smoking status: Current Every Day Smoker -- 1.00 packs/day  . Smokeless tobacco: None  . Alcohol Use: Yes     Comment: Denies today (09/07/12)   OB History    No data available     Review of  Systems 10/14 systems reviewed and are negative other than those stated in the HPI    Allergies  Review of patient's allergies indicates no known allergies.  Home Medications   Prior to Admission medications   Medication Sig Start Date End Date Taking? Authorizing Provider  ibuprofen (ADVIL,MOTRIN) 200 MG tablet Take 800 mg by mouth every 6 (six) hours as needed for headache.   Yes Historical Provider, MD  cephALEXin (KEFLEX) 500 MG capsule Take 1 capsule (500 mg total) by mouth 4 (four) times daily. 02/12/15   Lavera Guiseana Duo Liu, MD   BP 151/90 mmHg  Pulse 60  Temp(Src) 97.6 F (36.4 C) (Oral)  Resp 16  Ht 5\' 9"  (1.753 m)  Wt 145 lb (65.772 kg)  BMI 21.40 kg/m2  SpO2 100%  LMP 01/21/2015 Physical Exam Physical Exam  Nursing note and vitals reviewed. Constitutional: Well developed, well nourished, non-toxic, and in no acute distress Head: Normocephalic and atraumatic.  Mouth/Throat: Oropharynx is clear and moist.  Neck: Normal range of motion. Neck supple.  Cardiovascular: Normal rate and regular rhythm.   Pulmonary/Chest: Effort normal and breath sounds normal.  Abdominal: Soft. There is no tenderness. There is no rebound and no guarding. No CVa tenderness Musculoskeletal: Normal range of motion.  Skin: Skin is warm and dry.  Psychiatric: Cooperative Neurological:  Alert, oriented to person, place, time, and situation. Memory grossly in tact. Fluent speech. No dysarthria  or aphasia.  Cranial nerves:  Pupils are symmetric, and reactive to light. EOMI without nystagmus. No gaze deviation. Facial muscles symmetric with activation. Sensation to light touch over face in tact bilaterally. Hearing grossly in tact. Palate elevates symmetrically. Head turn and shoulder shrug are intact. Tongue midline.  Muscle bulk and tone normal. No pronator drift. Moves all extremities symmetrically. Sensation to light touch is in tact throughout in bilateral upper and lower extremities. Coordination  reveals no dysmetria with finger to nose.    ED Course  Procedures (including critical care time) Labs Review Labs Reviewed  URINALYSIS, ROUTINE W REFLEX MICROSCOPIC (NOT AT [facility]) - Abnormal; Notable for the following:    APPearance CLOUDY (*)    Hgb urine dipstick SMALL (*)    Nitrite POSITIVE (*)    Leukocytes, UA SMALL (*)    All other components within normal limits  CBC WITH DIFFERENTIAL/PLATELET - Abnormal; Notable for the following:    WBC 10.8 (*)    Hemoglobin 15.2 (*)    All other components within normal limits  URINE MICROSCOPIC-ADD ON - Abnormal; Notable for the following:    Squamous Epithelial / LPF 6-30 (*)    Bacteria, UA MANY (*)    All other components within normal limits  URINE CULTURE  COMPREHENSIVE METABOLIC PANEL  PREGNANCY, URINE    Imaging Review Dg Chest 2 View  02/12/2015  CLINICAL DATA:  Headache for 1 week, blurred vision, cough EXAM: CHEST  2 VIEW COMPARISON:  05/16/2014 FINDINGS: Cardiomediastinal silhouette is stable. No acute infiltrate or pleural effusion. No pulmonary edema. Bony thorax is unremarkable. IMPRESSION: No active cardiopulmonary disease. Electronically Signed   By: Natasha Mead M.D.   On: 02/12/2015 08:45   Ct Head Wo Contrast  02/12/2015  CLINICAL DATA:  Frontal headaches for 1 week EXAM: CT HEAD WITHOUT CONTRAST TECHNIQUE: Contiguous axial images were obtained from the base of the skull through the vertex without intravenous contrast. COMPARISON:  09/07/2012, 07/15/2012 FINDINGS: Bony calvarium is intact. No gross soft tissue abnormality is noted. No findings to suggest acute hemorrhage, acute infarction or space-occupying mass lesion are noted. IMPRESSION: No acute intracranial abnormality noted. Electronically Signed   By: Alcide Clever M.D.   On: 02/12/2015 08:39   I have personally reviewed and evaluated these images and lab results as part of my medical decision-making.   MDM   Final diagnoses:  Acute cystitis with hematuria   Acute nonintractable headache, unspecified headache type    34 year old female with history of bipolar disorder and polysubstance abuse who presents with headache for one week and multiple other complaints. Is a poor historian, very difficult to redirect with questioning. does endorse cocaine abuse this morning, which is consistent with her affect and presentation today. No evidence of acute psychosis and at this time, no concern for  harm to self or others.  She is neurologically intact, and overall headache does not seem serious in nature. No fever or meningismus, no concern for meningitis or other acute infectious cause. States that her headache is very mild in nature currently, did not start off as a severe headache. This seems unlikely to be due to subarachnoid hemorrhage. This quickly lengthen her field, seems more consistent with likely migraine headache, but his CT head was performed given that she has no prior history of headaches and now with headache for one week. This is visualized showing no acute intracranial processes. I also performed a bedside ocular ultrasound, revealing no evidence of retinal or vitreous  attachments. Overall has poor vision bilaterally, and no concern for acute intracranial process. No evidence of papilledema also noted on ultrasound. Likely benign primary headache, and is given NSAID and Reglan for treatment, to good effect. Basic blood work overall is unremarkable revealing no major electrolyte or metabolic derangements. She does have evidence of urinary tract infection, nitrite and leukocyte esterase positive. It is sent for culture, and I do not have suspicion for pyelonephritis or systemic infection. Given course of keflex. Strict return and follow-up instructions reviewed. She expressed understanding of all discharge instructions and felt comfortable with the plan of care.   Lavera Guise, MD 02/12/15 [phone]

## 2015-02-12 NOTE — ED Notes (Signed)
Pt states she has been having a headache for one week with intermittent blurred vision. Pt states she has had this blurred vision for a week. In addition, pt states she feels like she is bruising easy and finds that her extremities turn blue, this is not noted upon triage.

## 2015-02-12 NOTE — Discharge Instructions (Signed)
Please take antibiotics as prescribed. Return for worsening symptoms, including fever, vomiting and unable to keep down food/fluids, worsening pain, or any other symptoms concerning to you.  Urinary Tract Infection A urinary tract infection (UTI) can occur any place along the urinary tract. The tract includes the kidneys, ureters, bladder, and urethra. A type of germ called bacteria often causes a UTI. UTIs are often helped with antibiotic medicine.  HOME CARE   If given, take antibiotics as told by your doctor. Finish them even if you start to feel better.  Drink enough fluids to keep your pee (urine) clear or pale yellow.  Avoid tea, drinks with caffeine, and bubbly (carbonated) drinks.  Pee often. Avoid holding your pee in for a long time.  Pee before and after having sex (intercourse).  Wipe from front to back after you poop (bowel movement) if you are a woman. Use each tissue only once. GET HELP RIGHT AWAY IF:   You have back pain.  You have lower belly (abdominal) pain.  You have chills.  You feel sick to your stomach (nauseous).  You throw up (vomit).  Your burning or discomfort with peeing does not go away.  You have a fever.  Your symptoms are not better in 3 days. MAKE SURE YOU:   Understand these instructions.  Will watch your condition.  Will get help right away if you are not doing well or get worse.   This information is not intended to replace advice given to you by your health care provider. Make sure you discuss any questions you have with your health care provider.   Document Released: 08/12/2007 Document Revised: 03/16/2014 Document Reviewed: 09/24/2011 Elsevier Interactive Patient Education 2016 [employer]  Migraine Headache A migraine headache is an intense, throbbing pain on one or both sides of your head. A migraine can last for 30 minutes to several hours. CAUSES  The exact cause of a migraine headache is not always known. However, a  migraine may be caused when nerves in the brain become irritated and release chemicals that cause inflammation. This causes pain. Certain things may also trigger migraines, such as:  Alcohol.  Smoking.  Stress.  Menstruation.  Aged cheeses.  Foods or drinks that contain nitrates, glutamate, aspartame, or tyramine.  Lack of sleep.  Chocolate.  Caffeine.  Hunger.  Physical exertion.  Fatigue.  Medicines used to treat chest pain (nitroglycerine), birth control pills, estrogen, and some blood pressure medicines. SIGNS AND SYMPTOMS  Pain on one or both sides of your head.  Pulsating or throbbing pain.  Severe pain that prevents daily activities.  Pain that is aggravated by any physical activity.  Nausea, vomiting, or both.  Dizziness.  Pain with exposure to bright lights, loud noises, or activity.  General sensitivity to bright lights, loud noises, or smells. Before you get a migraine, you may get warning signs that a migraine is coming (aura). An aura may include:  Seeing flashing lights.  Seeing bright spots, halos, or zigzag lines.  Having tunnel vision or blurred vision.  Having feelings of numbness or tingling.  Having trouble talking.  Having muscle weakness. DIAGNOSIS  A migraine headache is often diagnosed based on:  Symptoms.  Physical exam.  A CT scan or MRI of your head. These imaging tests cannot diagnose migraines, but they can help rule out other causes of headaches. TREATMENT Medicines may be given for pain and nausea. Medicines can also be given to help prevent recurrent migraines.  HOME CARE INSTRUCTIONS  Only take over-the-counter or prescription medicines for pain or discomfort as directed by your health care provider. The use of long-term narcotics is not recommended.  Lie down in a dark, quiet room when you have a migraine.  Keep a journal to find out what may trigger your migraine headaches. For example, write down:  What  you eat and drink.  How much sleep you get.  Any change to your diet or medicines.  Limit alcohol consumption.  Quit smoking if you smoke.  Get 7-9 hours of sleep, or as recommended by your health care provider.  Limit stress.  Keep lights dim if bright lights bother you and make your migraines worse. SEEK IMMEDIATE MEDICAL CARE IF:   Your migraine becomes severe.  You have a fever.  You have a stiff neck.  You have vision loss.  You have muscular weakness or loss of muscle control.  You start losing your balance or have trouble walking.  You feel faint or pass out.  You have severe symptoms that are different from your first symptoms. MAKE SURE YOU:   Understand these instructions.  Will watch your condition.  Will get help right away if you are not doing well or get worse.   This information is not intended to replace advice given to you by your health care provider. Make sure you discuss any questions you have with your health care provider.   Document Released: 02/23/2005 Document Revised: 03/16/2014 Document Reviewed: 10/31/2012 Elsevier Interactive Patient Education [employer]

## 2015-02-12 NOTE — ED Notes (Signed)
Pt made aware to return if symptoms worsen or if any life threatening symptoms occur.   

## 2015-02-14 LAB — URINE CULTURE: Culture: >=100000

## 2015-02-15 ENCOUNTER — Telehealth (HOSPITAL_COMMUNITY): Payer: Self-pay

## 2015-02-15 NOTE — Telephone Encounter (Signed)
Post ED Visit - Positive Culture Follow-up  Culture report reviewed by antimicrobial stewardship pharmacist:  []  Enzo BiNathan Batchelder, Pharm.D. []  Celedonio MiyamotoJeremy Frens, Pharm.D., BCPS [x]  Garvin FilaMike Maccia, Pharm.D. []  Georgina PillionElizabeth Martin, Pharm.D., BCPS []  ManheimMinh Pham, [address]Pharm[address]D., BCPS, AAHIVP []  Estella HuskMichelle Turner, Pharm.D., BCPS, AAHIVP []  Tennis Mustassie Stewart, Pharm.D. []  Sherle Poeob Vincent, [address]Pharm[address]  Positive urine culture Treated with cephalexin, organism sensitive to the same and no further patient follow-up is required at this time.  Ashley JacobsFesterman, Mariaceleste Herrera C 02/15/2015, 11:29 AM

## 2016-07-08 ENCOUNTER — Emergency Department (HOSPITAL_COMMUNITY): Payer: Self-pay

## 2016-07-08 ENCOUNTER — Emergency Department (HOSPITAL_COMMUNITY)
Admission: EM | Admit: 2016-07-08 | Discharge: 2016-07-08 | Disposition: A | Discharge status: Home / Self Care | Payer: Self-pay | Attending: Emergency Medicine | Admitting: Emergency Medicine

## 2016-07-08 ENCOUNTER — Encounter (HOSPITAL_COMMUNITY): Payer: Self-pay

## 2016-07-08 DIAGNOSIS — F1910 Other psychoactive substance abuse, uncomplicated: Secondary | ICD-10-CM | POA: Insufficient documentation

## 2016-07-08 DIAGNOSIS — J40 Bronchitis, not specified as acute or chronic: Secondary | ICD-10-CM | POA: Insufficient documentation

## 2016-07-08 DIAGNOSIS — R0789 Other chest pain: Secondary | ICD-10-CM

## 2016-07-08 DIAGNOSIS — Z791 Long term (current) use of non-steroidal anti-inflammatories (NSAID): Secondary | ICD-10-CM | POA: Insufficient documentation

## 2016-07-08 DIAGNOSIS — F1410 Cocaine abuse, uncomplicated: Secondary | ICD-10-CM | POA: Insufficient documentation

## 2016-07-08 DIAGNOSIS — Z79899 Other long term (current) drug therapy: Secondary | ICD-10-CM | POA: Insufficient documentation

## 2016-07-08 DIAGNOSIS — F17210 Nicotine dependence, cigarettes, uncomplicated: Secondary | ICD-10-CM | POA: Insufficient documentation

## 2016-07-08 HISTORY — DX: Cocaine use, unspecified, uncomplicated: F14.90

## 2016-07-08 LAB — COMPREHENSIVE METABOLIC PANEL
ALBUMIN: 3.8 g/dL (ref 3.5–5.0)
ALT: 37 U/L (ref 14–54)
AST: 27 U/L (ref 15–41)
Alkaline Phosphatase: 51 U/L (ref 38–126)
Anion gap: 6 (ref 5–15)
BUN: 8 mg/dL (ref 6–20)
CO2: 31 mmol/L (ref 22–32)
Calcium: 9.3 mg/dL (ref 8.9–10.3)
Chloride: 102 mmol/L (ref 101–111)
Creatinine, Ser: 0.8 mg/dL (ref 0.44–1.00)
GFR calc Af Amer: >60 mL/min (ref >60)
GFR calc non Af Amer: >60 mL/min (ref >60)
GLUCOSE: 94 mg/dL (ref 65–99)
POTASSIUM: 3.5 mmol/L (ref 3.5–5.1)
SODIUM: 139 mmol/L (ref 135–145)
Total Bilirubin: 0.4 mg/dL (ref 0.3–1.2)
Total Protein: 6.9 g/dL (ref 6.5–8.1)

## 2016-07-08 LAB — CBC WITH DIFFERENTIAL/PLATELET
BASOS PCT: 0 %
Basophils Absolute: 0 10*3/uL (ref 0.0–0.1)
Eosinophils Absolute: 0.2 10*3/uL (ref 0.0–0.7)
Eosinophils Relative: 1 %
HEMATOCRIT: 41.1 % (ref 36.0–46.0)
HEMOGLOBIN: 13.7 g/dL (ref 12.0–15.0)
Lymphocytes Relative: 20 %
Lymphs Abs: 2.8 10*3/uL (ref 0.7–4.0)
MCH: 30.1 pg (ref 26.0–34.0)
MCHC: 33.3 g/dL (ref 30.0–36.0)
MCV: 90.3 fL (ref 78.0–100.0)
MONOS PCT: 5 %
Monocytes Absolute: 0.7 10*3/uL (ref 0.1–1.0)
NEUTROS ABS: 10 10*3/uL — AB (ref 1.7–7.7)
NEUTROS PCT: 74 %
PLATELETS: 313 10*3/uL (ref 150–400)
RBC: 4.55 MIL/uL (ref 3.87–5.11)
RDW: 13.7 % (ref 11.5–15.5)
WBC: 13.7 10*3/uL — ABNORMAL HIGH (ref 4.0–10.5)

## 2016-07-08 LAB — SEDIMENTATION RATE: SED RATE: 3 mm/h (ref 0–22)

## 2016-07-08 LAB — TROPONIN I: Troponin I: <0.03 ng/mL (ref <0.03)

## 2016-07-08 MED ORDER — KETOROLAC TROMETHAMINE 60 MG/2ML IM SOLN
60.0000 mg | Freq: Once | INTRAMUSCULAR | Status: AC
  Administered 2016-07-08: 60 mg via INTRAMUSCULAR
  Filled 2016-07-08: qty 2

## 2016-07-08 MED ORDER — AMOXICILLIN 500 MG PO CAPS: 500.0000 mg | ORAL_CAPSULE | Freq: Three times a day (TID) | ORAL | 0 refills | Status: DC

## 2016-07-08 MED ORDER — AZITHROMYCIN 250 MG PO TABS: ORAL_TABLET | ORAL | 0 refills | Status: DC

## 2016-07-08 NOTE — ED Triage Notes (Signed)
Pt reports chest pain that started tonight- pt reports chest pain has subsided, but now having pain in LUQ of abdomen.

## 2016-07-08 NOTE — ED Provider Notes (Signed)
[facility]-EMERGENCY DEPT Provider Note   CSN: [id number] Arrival date & time: 07/08/16  0151  Time seen 02:15 AM   History   Chief Complaint Chief Complaint  Patient presents with  . Chest Pain    HPI Gail Castro is a 36 y.o. female.  HPI  patient states she's been having pain in her left upper quadrant/lower chest off and on for the past few months. She states it will last variable amounts of time. However it is been there constantly since yesterday. She describes it as a pressure sensation or "like a baby's foot pressing under your chest". She has had nausea with vomiting once, she denies diarrhea. She states she's had a dry cough is been having fevers off and on for the last several days. Her highest temperature was 100.13 days ago. She states she's having some chills and tonight she felt short of breath and felt dizzy. She states she's been monitoring her blood pressure in it's been high intermittently the last couple days. Tonight it was 197/151 with heart rate 124 just prior to her calling EMS. She also states about midnight she had some chest pain that she described as a tightness in her throat and then a sharp upper central chest discomfort that lasted about an hour. States she's had this discomfort before with anxiety. Patient reports a history of IV drug abuse and snorting. She has been doing cocaine since she was 36 years old. She last did the cocaine 5 or 6 hours ago. She states she injected tonight. She states that she doesn't have insurance and she can't go anywhere to get help to stop doing the drugs. She states she "doesn't like day Loraine Leriche"  Patient reports a history of hepatitis C diagnosed about 5 years ago. She has not followed through with any treatment.  PCP none  Past Medical History:  Diagnosis Date  . Anxiety   . Bipolar 1 disorder (HCC)   . Cocaine use   . Hepatitis C   . Heroin abuse   . MRSA (methicillin resistant Staphylococcus aureus)   . Tobacco abuse      Patient Active Problem List   Diagnosis Date Noted  . Cellulitis 09/07/2012  . Heroin abuse 09/07/2012  . UTI (urinary tract infection) 09/07/2012  . Anxiety   . Bipolar 1 disorder (HCC)   . Tobacco abuse     Past Surgical History:  Procedure Laterality Date  . arm    . CHOLECYSTECTOMY    . EYE SURGERY    . TUBAL LIGATION      OB History    No data available       Home Medications    Prior to Admission medications   Medication Sig Start Date End Date Taking? Authorizing Provider  amoxicillin (AMOXIL) 500 MG capsule Take 1 capsule (500 mg total) by mouth 3 (three) times daily. 07/08/16   Devoria Albe, MD  azithromycin (ZITHROMAX Z-PAK) 250 MG tablet Take 2 po the first day then once a day for the next 4 days. 07/08/16   Devoria Albe, MD  cephALEXin (KEFLEX) 500 MG capsule Take 1 capsule (500 mg total) by mouth 4 (four) times daily. 02/12/15   Lavera Guise, MD  ibuprofen (ADVIL,MOTRIN) 200 MG tablet Take 800 mg by mouth every 6 (six) hours as needed for headache.    Historical Provider, MD    Family History No family history on file.  Social History Social History  Substance Use Topics  . Smoking status:  Current Every Day Smoker    Packs/day: 1.00    Types: Cigarettes  . Smokeless tobacco: Never Used  . Alcohol use Yes  unemployed Drinks 5-6 beers, drinks a fifth "when I can get it" + cocaine IV and snorting   Allergies   Patient has no known allergies.   Review of Systems Review of Systems  All other systems reviewed and are negative.    Physical Exam Updated Vital Signs BP 117/78   Pulse 69   Temp 98.4 F (36.9 C) (Oral)   Resp 17   Ht  (1.753 m)   Wt 154 lb (69.9 kg)   LMP 06/24/2016   SpO2 97%   BMI 22.74 kg/m   Vital signs normal    Physical Exam  Constitutional: She is oriented to person, place, and time. She appears well-developed and well-nourished.  Non-toxic appearance. She does not appear ill. No distress.  HENT:  Head:  Normocephalic and atraumatic.  Right Ear: External ear normal.  Left Ear: External ear normal.  Nose: Nose normal. No mucosal edema or rhinorrhea.  Mouth/Throat: Oropharynx is clear and moist and mucous membranes are normal. No dental abscesses or uvula swelling.  Eyes: Conjunctivae and EOM are normal. Pupils are equal, round, and reactive to light.  Neck: Normal range of motion and full passive range of motion without pain. Neck supple.  Cardiovascular: Normal rate, regular rhythm and normal heart sounds.  Exam reveals no gallop and no friction rub.   No murmur heard. Pulmonary/Chest: Effort normal and breath sounds normal. No respiratory distress. She has no wheezes. She has no rhonchi. She has no rales. She exhibits bony tenderness. She exhibits no tenderness and no crepitus.    Area of pain noted  Abdominal: Soft. Normal appearance and bowel sounds are normal. She exhibits no distension. There is no tenderness. There is no rebound and no guarding.  Musculoskeletal: Normal range of motion. She exhibits no edema or tenderness.  Moves all extremities well.   Neurological: She is alert and oriented to person, place, and time. She has normal strength. No cranial nerve deficit.  Skin: Skin is warm, dry and intact. No rash noted. No erythema. No pallor.  Several tatoos Pt has some bruising in her right antecubital area where she injected recently. No areas on infection  Psychiatric: She has a normal mood and affect. Her speech is normal and behavior is normal. Her mood appears not anxious.  Nursing note and vitals reviewed.    ED Treatments / Results  Labs (all labs ordered are listed, but only abnormal results are displayed) Results for orders placed or performed during the hospital encounter of 07/08/16  Comprehensive metabolic panel  Result Value Ref Range   Sodium 139 135 - 145 mmol/L   Potassium 3.5 3.5 - 5.1 mmol/L   Chloride 102 101 - 111 mmol/L   CO2 31 22 - 32 mmol/L    Glucose, Bld 94 65 - 99 mg/dL   BUN 8 6 - 20 mg/dL   Creatinine, Ser 5.40 0.44 - 1.00 mg/dL   Calcium 9.3 8.9 - 98.1 mg/dL   Total Protein 6.9 6.5 - 8.1 g/dL   Albumin 3.8 3.5 - 5.0 g/dL   AST 27 15 - 41 U/L   ALT 37 14 - 54 U/L   Alkaline Phosphatase 51 38 - 126 U/L   Total Bilirubin 0.4 0.3 - 1.2 mg/dL   GFR calc non Af Amer >60 >60 mL/min   GFR calc Af Amer >  60 >60 mL/min   Anion gap 6 5 - 15  Troponin I  Result Value Ref Range   Troponin I <0.03 <0.03 ng/mL  CBC with Differential  Result Value Ref Range   WBC 13.7 (H) 4.0 - 10.5 K/uL   RBC 4.55 3.87 - 5.11 MIL/uL   Hemoglobin 13.7 12.0 - 15.0 g/dL   HCT 16.1 09.6 - 04.5 %   MCV 90.3 78.0 - 100.0 fL   MCH 30.1 26.0 - 34.0 pg   MCHC 33.3 30.0 - 36.0 g/dL   RDW 40.9 81.1 - 91.4 %   Platelets 313 150 - 400 K/uL   Neutrophils Relative % 74 %   Neutro Abs 10.0 (H) 1.7 - 7.7 K/uL   Lymphocytes Relative 20 %   Lymphs Abs 2.8 0.7 - 4.0 K/uL   Monocytes Relative 5 %   Monocytes Absolute 0.7 0.1 - 1.0 K/uL   Eosinophils Relative 1 %   Eosinophils Absolute 0.2 0.0 - 0.7 K/uL   Basophils Relative 0 %   Basophils Absolute 0.0 0.0 - 0.1 K/uL  Sedimentation rate  Result Value Ref Range   Sed Rate 3 0 - 22 mm/hr   Laboratory interpretation all normal except leukocytosis     EKG  EKG Interpretation  Date/Time:  Wednesday Jul 08 2016 01:57:21 EDT Ventricular Rate:  64 PR Interval:    QRS Duration: 96 QT Interval:  403 QTC Calculation: 416 R Axis:   58 Text Interpretation:  Sinus rhythm Baseline wander in lead(s) II Early repolarization pattern No significant change since last tracing 10 Aug 2014 Confirmed by Dashton Czerwinski  MD-I, Lenay Lovejoy ([id number]) on 07/08/2016 2:03:17 AM       Radiology Dg Chest 2 View  Result Date: 07/08/2016 CLINICAL DATA:  Chest pain EXAM: CHEST  2 VIEW COMPARISON:  02/12/2015 FINDINGS: The heart size and mediastinal contours are within normal limits. Both lungs are clear. The visualized skeletal structures are  unremarkable. IMPRESSION: No active cardiopulmonary disease. Electronically Signed   By: Jasmine Pang M.D.   On: 07/08/2016 03:33    Procedures Procedures (including critical care time)  Medications Ordered in ED Medications  ketorolac (TORADOL) injection 60 mg (60 mg Intramuscular Given 07/08/16 0235)     Initial Impression / Assessment and Plan / ED Course  I have reviewed the triage vital signs and the nursing notes.  Pertinent labs & imaging results that were available during my care of the patient were reviewed by me and considered in my medical decision making (see chart for details).  Patient was given Toradol IM for pain. Laboratory testing was done including a sedimentation rate. Patient is IV drug abuser so concern is for endocarditis. I do not hear an obvious heart murmur. She does not have the splinter hemorrhages in her fingernails or other stigmata of endocarditis. I suspect her pain is more musculoskeletal. Patient has had a low-grade fever and cough so she does have some bronchitis. Chest x-ray was done to make sure she does not have a large pneumonia.  Recheck at 4 AM we discussed her test results. Her sedimentation rate is reassuring it is extremely low. Her chest x-ray does not show a large pneumonia. She will be treated for bronchitis with antibiotics and she can take over-the-counter nonsteroidal anti-inflammatory drugs for her chest wall pain. She was encouraged to quit doing drugs.   Final Clinical Impressions(s) / ED Diagnoses   Final diagnoses:  Chest wall pain  Bronchitis  Cocaine abuse  IV drug abuse  New Prescriptions New Prescriptions   AMOXICILLIN (AMOXIL) 500 MG CAPSULE    Take 1 capsule (500 mg total) by mouth 3 (three) times daily.   AZITHROMYCIN (ZITHROMAX Z-PAK) 250 MG TABLET    Take 2 po the first day then once a day for the next 4 days.  OTC ibuprofen or aleve  Plan discharge  Devoria Albe, MD, Concha Pyo, MD 07/08/16 [phone]

## 2016-07-08 NOTE — Discharge Instructions (Signed)
Use ice or heat for comfort. Take ibuprofen 600 mg 4 times a day OR aleve 2 tabs twice a day for pain as needed. Take the antibiotic, Amoxicillin until gone ($4 at [facility]) and the zpak. YOU NEED TO STOP USING DRUGS!!!    Recheck if you get a high fever, struggle to breathe or get worse.

## 2017-12-12 ENCOUNTER — Emergency Department (HOSPITAL_COMMUNITY): Payer: No Typology Code available for payment source

## 2017-12-12 ENCOUNTER — Encounter (HOSPITAL_COMMUNITY): Payer: Self-pay | Admitting: Emergency Medicine

## 2017-12-12 ENCOUNTER — Emergency Department (HOSPITAL_COMMUNITY)
Admission: EM | Admit: 2017-12-12 | Discharge: 2017-12-12 | Disposition: A | Discharge status: Home / Self Care | Payer: No Typology Code available for payment source | Attending: Emergency Medicine | Admitting: Emergency Medicine

## 2017-12-12 DIAGNOSIS — S301XXA Contusion of abdominal wall, initial encounter: Secondary | ICD-10-CM | POA: Diagnosis not present

## 2017-12-12 DIAGNOSIS — Y999 Unspecified external cause status: Secondary | ICD-10-CM | POA: Diagnosis not present

## 2017-12-12 DIAGNOSIS — Y939 Activity, unspecified: Secondary | ICD-10-CM | POA: Diagnosis not present

## 2017-12-12 DIAGNOSIS — Y929 Unspecified place or not applicable: Secondary | ICD-10-CM | POA: Diagnosis not present

## 2017-12-12 DIAGNOSIS — F17210 Nicotine dependence, cigarettes, uncomplicated: Secondary | ICD-10-CM | POA: Insufficient documentation

## 2017-12-12 LAB — CBC WITH DIFFERENTIAL/PLATELET
ABS IMMATURE GRANULOCYTES: 0.1 10*3/uL (ref 0.0–0.1)
Basophils Absolute: 0 10*3/uL (ref 0.0–0.1)
Basophils Relative: 0 %
Eosinophils Absolute: 0.1 10*3/uL (ref 0.0–0.7)
Eosinophils Relative: 1 %
HEMATOCRIT: 38.9 % (ref 36.0–46.0)
HEMOGLOBIN: 12.4 g/dL (ref 12.0–15.0)
Immature Granulocytes: 0 %
LYMPHS ABS: 1.8 10*3/uL (ref 0.7–4.0)
LYMPHS PCT: 16 %
MCH: 28.4 pg (ref 26.0–34.0)
MCHC: 31.9 g/dL (ref 30.0–36.0)
MCV: 89.2 fL (ref 78.0–100.0)
MONO ABS: 0.6 10*3/uL (ref 0.1–1.0)
MONOS PCT: 5 %
NEUTROS ABS: 8.9 10*3/uL — AB (ref 1.7–7.7)
Neutrophils Relative %: 78 %
Platelets: 284 10*3/uL (ref 150–400)
RBC: 4.36 MIL/uL (ref 3.87–5.11)
RDW: 13.7 % (ref 11.5–15.5)
WBC: 11.4 10*3/uL — ABNORMAL HIGH (ref 4.0–10.5)

## 2017-12-12 LAB — COMPREHENSIVE METABOLIC PANEL
ALK PHOS: 45 U/L (ref 38–126)
ALT: 47 U/L — AB (ref 0–44)
AST: 40 U/L (ref 15–41)
Albumin: 3.5 g/dL (ref 3.5–5.0)
Anion gap: 6 (ref 5–15)
BUN: 18 mg/dL (ref 6–20)
CO2: 24 mmol/L (ref 22–32)
Calcium: 8.4 mg/dL — ABNORMAL LOW (ref 8.9–10.3)
Chloride: 103 mmol/L (ref 98–111)
Creatinine, Ser: 0.91 mg/dL (ref 0.44–1.00)
Glucose, Bld: 109 mg/dL — ABNORMAL HIGH (ref 70–99)
Potassium: 3.5 mmol/L (ref 3.5–5.1)
Sodium: 133 mmol/L — ABNORMAL LOW (ref 135–145)
TOTAL PROTEIN: 6.1 g/dL — AB (ref 6.5–8.1)
Total Bilirubin: 0.5 mg/dL (ref 0.3–1.2)

## 2017-12-12 LAB — I-STAT BETA HCG BLOOD, ED (MC, WL, AP ONLY)

## 2017-12-12 MED ORDER — FENTANYL CITRATE (PF) 100 MCG/2ML IJ SOLN: 50.0000 ug | INTRAMUSCULAR | Status: DC | PRN

## 2017-12-12 MED ORDER — IOHEXOL 300 MG/ML  SOLN
100.0000 mL | Freq: Once | INTRAMUSCULAR | Status: AC | PRN
  Administered 2017-12-12: 100 mL via INTRAVENOUS

## 2017-12-12 MED ORDER — ACETAMINOPHEN 500 MG PO TABS
1000.0000 mg | ORAL_TABLET | Freq: Once | ORAL | Status: AC
  Administered 2017-12-12: 1000 mg via ORAL
  Filled 2017-12-12: qty 2

## 2017-12-12 NOTE — ED Notes (Signed)
Patient verbalizes understanding of discharge instructions. Opportunity for questioning and answers were provided. Armband removed by staff, pt discharged from ED.  
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
## 2017-12-12 NOTE — Discharge Instructions (Addendum)
Use Tylenol and Motrin as needed for pain, ice as needed. See a clinician if you have persistent vomiting, blood in your stools or new concerns.
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
## 2018-04-24 ENCOUNTER — Emergency Department (HOSPITAL_COMMUNITY)
Admission: EM | Admit: 2018-04-24 | Discharge: 2018-04-24 | Disposition: A | Discharge status: Other Acute Inpatient Hospital | Payer: 59 | Attending: Emergency Medicine | Admitting: Emergency Medicine

## 2018-04-24 ENCOUNTER — Other Ambulatory Visit: Payer: Self-pay

## 2018-04-24 ENCOUNTER — Inpatient Hospital Stay (HOSPITAL_COMMUNITY)
Admission: AD | Admit: 2018-04-24 | Discharge: 2018-04-29 | DRG: 885 | Disposition: A | Discharge status: Home / Self Care | Payer: 59 | Source: Intra-hospital | Attending: Psychiatry | Admitting: Psychiatry

## 2018-04-24 ENCOUNTER — Encounter (HOSPITAL_COMMUNITY): Payer: Self-pay | Admitting: Emergency Medicine

## 2018-04-24 ENCOUNTER — Encounter (HOSPITAL_COMMUNITY): Payer: Self-pay | Admitting: Behavioral Health

## 2018-04-24 DIAGNOSIS — Z8614 Personal history of Methicillin resistant Staphylococcus aureus infection: Secondary | ICD-10-CM

## 2018-04-24 DIAGNOSIS — F1910 Other psychoactive substance abuse, uncomplicated: Secondary | ICD-10-CM

## 2018-04-24 DIAGNOSIS — F17210 Nicotine dependence, cigarettes, uncomplicated: Secondary | ICD-10-CM | POA: Insufficient documentation

## 2018-04-24 DIAGNOSIS — F314 Bipolar disorder, current episode depressed, severe, without psychotic features: Secondary | ICD-10-CM | POA: Insufficient documentation

## 2018-04-24 DIAGNOSIS — F1320 Sedative, hypnotic or anxiolytic dependence, uncomplicated: Secondary | ICD-10-CM | POA: Diagnosis present

## 2018-04-24 DIAGNOSIS — F1994 Other psychoactive substance use, unspecified with psychoactive substance-induced mood disorder: Secondary | ICD-10-CM | POA: Diagnosis present

## 2018-04-24 DIAGNOSIS — F1420 Cocaine dependence, uncomplicated: Secondary | ICD-10-CM | POA: Diagnosis present

## 2018-04-24 DIAGNOSIS — F332 Major depressive disorder, recurrent severe without psychotic features: Principal | ICD-10-CM | POA: Diagnosis present

## 2018-04-24 DIAGNOSIS — F419 Anxiety disorder, unspecified: Secondary | ICD-10-CM | POA: Diagnosis present

## 2018-04-24 DIAGNOSIS — R45851 Suicidal ideations: Secondary | ICD-10-CM | POA: Insufficient documentation

## 2018-04-24 DIAGNOSIS — Z23 Encounter for immunization: Secondary | ICD-10-CM

## 2018-04-24 DIAGNOSIS — F1110 Opioid abuse, uncomplicated: Secondary | ICD-10-CM | POA: Diagnosis present

## 2018-04-24 DIAGNOSIS — F1520 Other stimulant dependence, uncomplicated: Secondary | ICD-10-CM | POA: Insufficient documentation

## 2018-04-24 DIAGNOSIS — F1010 Alcohol abuse, uncomplicated: Secondary | ICD-10-CM | POA: Diagnosis present

## 2018-04-24 DIAGNOSIS — F1920 Other psychoactive substance dependence, uncomplicated: Secondary | ICD-10-CM

## 2018-04-24 HISTORY — DX: Alcohol abuse, uncomplicated: F10.10

## 2018-04-24 HISTORY — DX: Intentional self-harm by other specified means, initial encounter: X83.8XXA

## 2018-04-24 HISTORY — DX: Suicidal ideations: R45.851

## 2018-04-24 LAB — CBC
HCT: 43.7 % (ref 36.0–46.0)
Hemoglobin: 14.1 g/dL (ref 12.0–15.0)
MCH: 29.6 pg (ref 26.0–34.0)
MCHC: 32.3 g/dL (ref 30.0–36.0)
MCV: 91.6 fL (ref 80.0–100.0)
PLATELETS: 298 10*3/uL (ref 150–400)
RBC: 4.77 MIL/uL (ref 3.87–5.11)
RDW: 13.7 % (ref 11.5–15.5)
WBC: 9.4 10*3/uL (ref 4.0–10.5)
nRBC: 0 % (ref 0.0–0.2)

## 2018-04-24 LAB — COMPREHENSIVE METABOLIC PANEL
ALT: 43 U/L (ref 0–44)
AST: 31 U/L (ref 15–41)
Albumin: 4.2 g/dL (ref 3.5–5.0)
Alkaline Phosphatase: 54 U/L (ref 38–126)
Anion gap: 9 (ref 5–15)
BUN: 12 mg/dL (ref 6–20)
CO2: 25 mmol/L (ref 22–32)
Calcium: 9.1 mg/dL (ref 8.9–10.3)
Chloride: 103 mmol/L (ref 98–111)
Creatinine, Ser: 0.65 mg/dL (ref 0.44–1.00)
GFR calc Af Amer: >60 mL/min (ref >60)
GFR calc non Af Amer: >60 mL/min (ref >60)
Glucose, Bld: 94 mg/dL (ref 70–99)
Potassium: 3.6 mmol/L (ref 3.5–5.1)
Sodium: 137 mmol/L (ref 135–145)
Total Bilirubin: 0.9 mg/dL (ref 0.3–1.2)
Total Protein: 7.4 g/dL (ref 6.5–8.1)

## 2018-04-24 LAB — RAPID URINE DRUG SCREEN, HOSP PERFORMED
AMPHETAMINES: NOT DETECTED
Barbiturates: NOT DETECTED
Benzodiazepines: POSITIVE — AB
Cocaine: POSITIVE — AB
Opiates: NOT DETECTED
Tetrahydrocannabinol: NOT DETECTED

## 2018-04-24 LAB — I-STAT BETA HCG BLOOD, ED (MC, WL, AP ONLY): I-stat hCG, quantitative: <5 m[IU]/mL (ref <5)

## 2018-04-24 LAB — SALICYLATE LEVEL: Salicylate Lvl: <7 mg/dL (ref 2.8–30.0)

## 2018-04-24 LAB — ACETAMINOPHEN LEVEL: Acetaminophen (Tylenol), Serum: <10 ug/mL — ABNORMAL LOW (ref 10–30)

## 2018-04-24 LAB — ETHANOL: Alcohol, Ethyl (B): <10 mg/dL (ref <10)

## 2018-04-24 MED ORDER — ALUM & MAG HYDROXIDE-SIMETH 200-200-20 MG/5ML PO SUSP
30.0000 mL | ORAL | Status: DC | PRN
  Administered 2018-04-28: 30 mL via ORAL
  Filled 2018-04-24: qty 30

## 2018-04-24 MED ORDER — HYDROXYZINE HCL 25 MG PO TABS
25.0000 mg | ORAL_TABLET | Freq: Three times a day (TID) | ORAL | Status: DC | PRN
  Administered 2018-04-25 – 2018-04-29 (×11): 25 mg via ORAL
  Filled 2018-04-24 (×10): qty 1

## 2018-04-24 MED ORDER — ACETAMINOPHEN 325 MG PO TABS
650.0000 mg | ORAL_TABLET | Freq: Four times a day (QID) | ORAL | Status: DC | PRN
  Administered 2018-04-25 – 2018-04-26 (×2): 650 mg via ORAL
  Filled 2018-04-24 (×2): qty 2

## 2018-04-24 MED ORDER — NICOTINE 21 MG/24HR TD PT24
21.0000 mg | MEDICATED_PATCH | Freq: Every day | TRANSDERMAL | Status: DC
  Administered 2018-04-25 – 2018-04-29 (×5): 21 mg via TRANSDERMAL
  Filled 2018-04-24 (×9): qty 1

## 2018-04-24 MED ORDER — MAGNESIUM HYDROXIDE 400 MG/5ML PO SUSP: 30.0000 mL | Freq: Every day | ORAL | Status: DC | PRN

## 2018-04-24 MED ORDER — ENSURE ENLIVE PO LIQD: 237.0000 mL | Freq: Two times a day (BID) | ORAL | Status: DC

## 2018-04-24 MED ORDER — NICOTINE 21 MG/24HR TD PT24: 21.0000 mg | MEDICATED_PATCH | Freq: Every day | TRANSDERMAL | Status: DC

## 2018-04-24 MED ORDER — TRAZODONE HCL 50 MG PO TABS
50.0000 mg | ORAL_TABLET | Freq: Every evening | ORAL | Status: DC | PRN
  Administered 2018-04-25 – 2018-04-28 (×4): 50 mg via ORAL
  Filled 2018-04-24 (×3): qty 1

## 2018-04-24 MED ORDER — ACETAMINOPHEN 325 MG PO TABS: 650.0000 mg | ORAL_TABLET | ORAL | Status: DC | PRN

## 2018-04-24 MED ORDER — PNEUMOCOCCAL VAC POLYVALENT 25 MCG/0.5ML IJ INJ
0.5000 mL | INJECTION | INTRAMUSCULAR | Status: AC
  Administered 2018-04-26: 0.5 mL via INTRAMUSCULAR

## 2018-04-24 MED ORDER — GABAPENTIN 100 MG PO CAPS
200.0000 mg | ORAL_CAPSULE | Freq: Three times a day (TID) | ORAL | Status: DC
  Administered 2018-04-24: 200 mg via ORAL
  Filled 2018-04-24 (×7): qty 2

## 2018-04-24 NOTE — ED Notes (Signed)
PT REPORTS USE IV COCAINE AND ETOH LAST USED LATE EVENING.

## 2018-04-24 NOTE — ED Notes (Signed)
Pt notes, "I want to get some help." Did not go into detail what type of help. Boyfriend, [place] Mallory comes into the room and states, "I would like to take things home with me that was on the list." Writer inform Mr. Leatha Gilding, "once the patient awaken and able to sign a release of property he can get the property."

## 2018-04-24 NOTE — ED Notes (Signed)
SECURITY AWARE OF NEED FOR CLEARANCE OF THIS PT.

## 2018-04-24 NOTE — ED Notes (Signed)
HEP C WITHOUT TREATMENT AT PRESENT

## 2018-04-24 NOTE — ED Notes (Signed)
TTS UNABLE TO HAVE INTERVIEW WITH PT AT PRESENT. WILL RE ASSESS.  PT AND FAMILY AWARE OF NEED FOR URINE SAMPLE. UNABLE TO OBTAIN AT THIS TIME.

## 2018-04-24 NOTE — ED Notes (Signed)
Belongings: blue jeans, gray hoodie, bra, orange (t-shirt), black gym pants, pink/white panties, gray boots.  Pt unable to sign, she is sleeping at the time.

## 2018-04-24 NOTE — ED Notes (Signed)
PSYCH TEAM AT BEDSIDE

## 2018-04-24 NOTE — ED Triage Notes (Signed)
Patient suicidal. Patient using cocaine up till 2 hours ago. Husband bringing her in to get help.

## 2018-04-24 NOTE — Progress Notes (Signed)
Patient did not attend the evening speaker AA meeting. Pt was notified that group was beginning but remained in bed.   

## 2018-04-24 NOTE — ED Notes (Signed)
Report given to [place], Charity fundraiser. Unit is ready to accept pt.

## 2018-04-24 NOTE — ED Notes (Signed)
Bed: [facility] Expected date:  Expected time:  Means of arrival:  Comments: ROOM 10

## 2018-04-24 NOTE — ED Notes (Signed)
Patient denies pain and is resting comfortably.  

## 2018-04-24 NOTE — Progress Notes (Signed)
Pt admitted to the adult unit vol from [facility]. Pt presented with a flat affect and a depressed mood. On approach, pt noted to be guarded and forwarded little information during the admission process. Pt expressed that she relapsed in January 2020, on Cocaine, THC and Xanax. Pt reported spending  $1500 dollars on cocaine in three days and taking 4-5 mg of Xanax daily (buying from off the street). At the time of admission, the pt was unable to identify any triggers leading to relapsing on drugs. Pt reported having three children under the age of 80 whom one child lives with her mother and the other two lives with their father. Pt denies any active SI/HI. Pt verbally contracts for safety. Pt denies AVH.

## 2018-04-24 NOTE — BH Assessment (Addendum)
Assessment Note  Gail Castro is an 38 y.o. female who presents voluntarily with BF, Gail Castro [phone]). BF states that pt had been clean 18 months and then relapsed in mid January on Adderall, and has also been drinking and using cocaine and "spiraling out of control" since then. He states that he came home Tuesday to find her with his pistol to her head and pt had also cut her wrists.  Pt is a poor historian due to current mental status, so limited history is able to be gathered at this time, but she states that she has been "being hard on myself for relapsing". She admits to [facility], and said, "he took the gun away from me". Pt states that she was in a rehab program in [place], but is unable to say when/where due to grogginess. Pt states that she has been up since Monday. She states that she was working at [employer], but she isn't sure if she still has a job.  MSE: Pt is casually dressed, alert, oriented x4 with slurred speech and slow motor behavior. Eye contact is poor. Pt's mood is depressed and affect is depressed and anxious. Affect is congruent with mood. Thought process is coherent and relevant. There is no indication that pt is currently responding to internal stimuli or experiencing delusional thought content. Pt was cooperative throughout assessment.   Dr. Bettye Boeck, NP recommended in/outpatient psychiatric treatment. Per Isidoro Donning, [facility] is accepted at [facility] to room 306-2 at 3:30 pm. Pt will be transported by [facility]. Diagnosis: Primary Mental Health   F31.4 Bipolar depressed severe, without psychosis  F15.20 Amphetamine-type Substance use disorder, Severe   Past Medical History:  Past Medical History:  Diagnosis Date  . Anxiety   . Bipolar 1 disorder (HCC)   . Cocaine use   . ETOH abuse   . Hepatitis C   . Heroin abuse (HCC)   . MRSA (methicillin resistant Staphylococcus aureus)   . Suicide and self-inflicted injury (HCC)   . Suicide ideation    . Tobacco abuse     Past Surgical History:  Procedure Laterality Date  . arm    . CHOLECYSTECTOMY    . EYE SURGERY    . TUBAL LIGATION      Family History: History reviewed. No pertinent family history.  Social History:  reports that she has been smoking cigarettes. She has been smoking about 1.00 pack per day. She has never used smokeless tobacco. She reports current alcohol use. She reports current drug use. Drugs: Cocaine and Marijuana.  Additional Social History:  Alcohol / Drug Use Pain Medications: hx of use Prescriptions: yes Over the Counter: UTA Longest period of sobriety (when/how long): 18 mom per BF  CIWA: CIWA-Ar BP: 113/67 Pulse Rate: 62 Nausea and Vomiting: no nausea and no vomiting Tactile Disturbances: none Tremor: no tremor Auditory Disturbances: not present Paroxysmal Sweats: no sweat visible Visual Disturbances: not present Anxiety: no anxiety, at ease Headache, Fullness in Head: none present Agitation: normal activity Orientation and Clouding of Sensorium: cannot do serial additions or is uncertain about date CIWA-Ar Total: 1 COWS: Clinical Opiate Withdrawal Scale (COWS) Resting Pulse Rate: Pulse Rate 80 or below Sweating: No report of chills or flushing Restlessness: Reports difficulty sitting still, but is able to do so Pupil Size: Pupils possibly larger than normal for room light Bone or Joint Aches: Not present Runny Nose or Tearing: Not present GI Upset: No GI symptoms Tremor: No tremor Yawning: No yawning Anxiety or Irritability:  None Gooseflesh Skin: Skin is smooth COWS Total Score: 2  Allergies: No Known Allergies  Home Medications: (Not in a hospital admission)   OB/GYN Status:  No LMP recorded.  General Assessment Data Assessment unable to be completed: Yes Reason for not completing assessment: pt is impaired by substance, unable to participate in assessment at this time Location of Assessment: [facility] TTS Assessment: In  system Is this a Tele or Face-to-Face Assessment?: Face-to-Face Is this an Initial Assessment or a Re-assessment for this encounter?: Initial Assessment Patient Accompanied by:: (BF) Language Other than English: No Living Arrangements: (house) What gender do you identify as?: Female Marital status: Long term relationship Pregnancy Status: Unknown Living Arrangements: Spouse/significant other Can pt return to current living arrangement?: Yes Admission Status: Voluntary Is patient capable of signing voluntary admission?: Yes Referral Source: Self/Family/Friend [facility]     Crisis Care Plan Living Arrangements: Spouse/significant other Name of Psychiatrist: (none) Name of Therapist: none  Education Status Is patient currently in school?: No  Risk to self with the past 6 months Suicidal Ideation: No-Not Currently/Within Last 6 Months Has patient been a risk to self within the past 6 months prior to admission? : Yes Suicidal Intent: No-Not Currently/Within Last 6 Months Has patient had any suicidal intent within the past 6 months prior to admission? : Yes Is patient at risk for suicide?: Yes Suicidal Plan?: No-Not Currently/Within Last 6 Months Has patient had any suicidal plan within the past 6 months prior to admission? : Yes Access to Means: Yes Specify Access to Suicidal Means: pistol What has been your use of drugs/alcohol within the last 12 months?: see Sa section Previous Attempts/Gestures: (UTA) How many times?: (UTA) Other Self Harm Risks: (SA) Triggers for Past Attempts: Unpredictable Intentional Self Injurious Behavior: Cutting Comment - Self Injurious Behavior: cut wrists Family Suicide History: Unable to assess Recent stressful life event(s): Turmoil (Comment)(relapse) Persecutory voices/beliefs?: No Depression: Yes Depression Symptoms: Insomnia, Feeling worthless/self pity, Isolating Substance abuse history and/or treatment for substance abuse?:  Yes Suicide prevention information given to non-admitted patients: Not applicable  Risk to Others within the past 6 months Homicidal Ideation: No Does patient have any lifetime risk of violence toward others beyond the six months prior to admission? : Unknown Thoughts of Harm to Others: No Current Homicidal Intent: No Current Homicidal Plan: No Access to Homicidal Means: No History of harm to others?: No Assessment of Violence: None Noted Does patient have access to weapons?: Yes (Comment)(pistol in home) Criminal Charges Pending?: No Does patient have a court date: No Is patient on probation?: No  Psychosis Hallucinations: None noted Delusions: None noted  Mental Status Report Appearance/Hygiene: Disheveled Eye Contact: Poor Motor Activity: Psychomotor retardation, Restlessness Speech: Slurred Level of Consciousness: Sedated Mood: Depressed, Anxious Affect: Depressed, Anxious Anxiety Level: Severe Thought Processes: Unable to Assess Judgement: Impaired Orientation: Not oriented Obsessive Compulsive Thoughts/Behaviors: None  Cognitive Functioning Concentration: Poor Memory: Recent Impaired, Remote Intact Is patient IDD: No Insight: Poor Impulse Control: Poor Appetite: Poor Have you had any weight changes? : No Change Sleep: Decreased Total Hours of Sleep: (UTA) Vegetative Symptoms: Decreased grooming  ADLScreening [facility]) Patient's cognitive ability adequate to safely complete daily activities?: Yes Patient able to express need for assistance with ADLs?: Yes Independently performs ADLs?: Yes (appropriate for developmental age)  Prior Inpatient Therapy Prior Inpatient Therapy: Yes Prior Therapy Dates: unk Prior Therapy Facilty/Provider(s): UTA Reason for Treatment: SA  Prior Outpatient Therapy Prior Outpatient Therapy: (UTA)  ADL Screening (  condition at time of admission) Patient's cognitive ability adequate to safely complete daily  activities?: Yes Is the patient deaf or have difficulty hearing?: No Does the patient have difficulty seeing, Castro when wearing glasses/contacts?: No Does the patient have difficulty concentrating, remembering, or making decisions?: No Patient able to express need for assistance with ADLs?: Yes Does the patient have difficulty dressing or bathing?: No Independently performs ADLs?: Yes (appropriate for developmental age) Does the patient have difficulty walking or climbing stairs?: No Weakness of Legs: None Weakness of Arms/Hands: None  Home Assistive Devices/Equipment Home Assistive Devices/Equipment: None  Therapy Consults (therapy consults require a physician order) PT Evaluation Needed: No OT Evalulation Needed: No SLP Evaluation Needed: No Abuse/Neglect Assessment (Assessment to be complete while patient is alone) Abuse/Neglect Assessment Can Be Completed: Unable to assess, patient is non-responsive or altered mental status Values / Beliefs Cultural Requests During Hospitalization: None Spiritual Requests During Hospitalization: None Consults Social Work Consult Needed: No Merchant navy officer (For Healthcare) Does Patient Have a Medical Advance Directive?: No Would patient like information on creating a medical advance directive?: No - Patient declined          Disposition:  Disposition Initial Assessment Completed for this Encounter: Yes Disposition of Patient: Admit  On Site Evaluation by:   Reviewed with Physician:    Theo Dills 04/24/2018 11:09 AM

## 2018-04-24 NOTE — ED Notes (Signed)
Pt is currently taking crack and cocaine up until 2 hours ago. Also drinking heavily.

## 2018-04-24 NOTE — ED Notes (Signed)
ED Provider at bedside. EDP WENTZ 

## 2018-04-24 NOTE — ED Notes (Addendum)
PT IS UNABLE TO AMBULATE WITHOUT ASSISTANCE. PT IS UNABLE TO FOLLOW COMMANDS CONSISTENTLY. PT NEEDS VERBAL AND PHYSICAL CUES. PT IS UNABLE TO MEET SAPPU GUIDELINES. NO TCU BEDS AT THIS TIME.  WILL RE ASSESS.  MULTIPLE ATTEMPTS TO OBTAIN URINE SAMPLE. NOT SUCCESSFUL

## 2018-04-24 NOTE — Tx Team (Signed)
Initial Treatment Plan 04/24/2018 5:43 PM Gail Castro ZTA:[id number]    PATIENT STRESSORS: Marital or family conflict Substance abuse   PATIENT STRENGTHS: Ability for insight Capable of independent living   PATIENT IDENTIFIED PROBLEMS: "relasping on drug in January."  "suicidal thoughts after relapsing on drugs."                   DISCHARGE CRITERIA:  Ability to meet basic life and health needs Adequate post-discharge living arrangements Improved stabilization in mood, thinking, and/or behavior  PRELIMINARY DISCHARGE PLAN: Attend aftercare/continuing care group Attend PHP/IOP  PATIENT/FAMILY INVOLVEMENT: This treatment plan has been presented to and reviewed with the patient, Gail Castro, and/or family member.  The patient and family have been given the opportunity to ask questions and make suggestions.  Layla Barter, RN 04/24/2018, 5:43 PM

## 2018-04-24 NOTE — BH Assessment (Signed)
Per Isidoro Donning, [facility] is accepted at [facility] to room 306-2 at 3:30 pm. Pt will be transported by [facility]. Pt signed voluntary paperwork, which was faxed to [facility]. Please call report to [phone].

## 2018-04-24 NOTE — ED Provider Notes (Signed)
[facility]-EMERGENCY DEPT Provider Note   CSN: [id number] Arrival date & time: 04/24/18  0355     History   Chief Complaint Chief Complaint  Patient presents with  . Drug Overdose    crack and cocaine.    HPI Gail Castro is a 38 y.o. female.  HPI   Patient reportedly brought to the ED today by her husband for suicidal ideation and substance abuse.  Patient is refusing to talk.  Level 5 caveat-reluctant historian  Past Medical History:  Diagnosis Date  . Anxiety   . Bipolar 1 disorder (HCC)   . Cocaine use   . ETOH abuse   . Hepatitis C   . Heroin abuse (HCC)   . MRSA (methicillin resistant Staphylococcus aureus)   . Suicide and self-inflicted injury (HCC)   . Suicide ideation   . Tobacco abuse     Patient Active Problem List   Diagnosis Date Noted  . Cellulitis 09/07/2012  . Heroin abuse (HCC) 09/07/2012  . UTI (urinary tract infection) 09/07/2012  . Anxiety   . Bipolar 1 disorder (HCC)   . Tobacco abuse     Past Surgical History:  Procedure Laterality Date  . arm    . CHOLECYSTECTOMY    . EYE SURGERY    . TUBAL LIGATION       OB History   No obstetric history on file.      Home Medications    Prior to Admission medications   Not on File    Family History History reviewed. No pertinent family history.  Social History Social History   Tobacco Use  . Smoking status: Current Every Day Smoker    Packs/day: 1.00    Types: Cigarettes  . Smokeless tobacco: Never Used  Substance Use Topics  . Alcohol use: Yes  . Drug use: Yes    Types: Cocaine, Marijuana     Allergies   Patient has no known allergies.   Review of Systems Review of Systems  Unable to perform ROS: Other     Physical Exam Updated Vital Signs BP 109/74 (BP Location: Right Arm)   Pulse 70   Temp 97.9 F (36.6 C) (Oral)   Resp 16   Ht 5\' 9"  (1.753 m)   Wt 71.7 kg   SpO2 100%   BMI 23.33 kg/m   Physical Exam Vitals signs and  nursing note reviewed.  Constitutional:      General: She is not in acute distress.    Appearance: She is well-developed. She is not ill-appearing or diaphoretic.     Comments: Disheveled  HENT:     Head: Normocephalic and atraumatic.     Right Ear: External ear normal.     Left Ear: External ear normal.     Mouth/Throat:     Mouth: Mucous membranes are moist.  Eyes:     Conjunctiva/sclera: Conjunctivae normal.     Pupils: Pupils are equal, round, and reactive to light.  Neck:     Musculoskeletal: Normal range of motion and neck supple.     Trachea: Phonation normal.  Cardiovascular:     Rate and Rhythm: Normal rate.  Pulmonary:     Effort: Pulmonary effort is normal.  Musculoskeletal: Normal range of motion.  Skin:    General: Skin is warm and dry.     Comments: Minor abrasion left volar wrist, healing without signs of infection.  Neurological:     Mental Status: She is alert.  Cranial Nerves: No cranial nerve deficit.     Sensory: No sensory deficit.     Motor: No abnormal muscle tone.     Coordination: Coordination normal.     Comments: Patient is sleeping, when I spoke to her she sat up, would not speak then laid back down.  Psychiatric:     Comments: She does not appear to be responding to internal stimuli.      ED Treatments / Results  Labs (all labs ordered are listed, but only abnormal results are displayed) Labs Reviewed  ACETAMINOPHEN LEVEL - Abnormal; Notable for the following components:      Result Value   Acetaminophen (Tylenol), Serum <10 (*)    All other components within normal limits  COMPREHENSIVE METABOLIC PANEL  ETHANOL  SALICYLATE LEVEL  CBC  RAPID URINE DRUG SCREEN, HOSP PERFORMED  I-STAT BETA HCG BLOOD, ED ([facility], [facility], [facility] ONLY)    EKG None  Radiology No results found.  Procedures Procedures (including critical care time)  Medications Ordered in ED Medications  acetaminophen (TYLENOL) tablet 650 mg (has no administration in time  range)  nicotine (NICODERM CQ - dosed in mg/24 hours) patch 21 mg (has no administration in time range)     Initial Impression / Assessment and Plan / ED Course  I have reviewed the triage vital signs and the nursing notes.  Pertinent labs & imaging results that were available during my care of the patient were reviewed by me and considered in my medical decision making (see chart for details).  Clinical Course as of Apr 24 836  Wynelle LinkSun Apr 24, 2018  0725 The patient's boyfriend is now here and gives additional history.  Patient held a gun to her head, several days ago and threatened suicide.  Also that day she cut her left wrist.  She has been stating that she "does not want to live."  Boyfriend states that she has relapsed on cocaine and alcohol.  This began several weeks ago.  Patient is now somewhat more communicative and cooperative and states that she uses Xanax which is not prescribed.  She did not admit to suicidal ideation.  She appears cooperative at this time.   [EW]  [phone] Normal  I-Stat beta hCG blood, ED [EW]  0834 Normal  Acetaminophen level(!) [EW]  0834 Normal  Comprehensive metabolic panel [EW]  0834 Normal  Salicylate level [EW]  0834 Normal  cbc [EW]  0834 normal  Ethanol [EW]  0834 At this point the patient is medically cleared for treatment by psychiatry.   [EW]    Clinical Course User Index [EW] Mancel BaleWentz, Lexey Fletes, MD     Patient Vitals for the past 24 hrs:  BP Temp Temp src Pulse Resp SpO2 Height Weight  04/24/18 0635 109/74 97.9 F (36.6 C) Oral 70 16 100 % 5\' 9"  (1.753 m) 71.7 kg    8:38 AM Reevaluation with update and discussion. After initial assessment and treatment, an updated evaluation reveals no change in clinical status, psychiatry consult requested. Mancel BaleElliott Jorryn Hershberger   Medical Decision Making: Depression with suicidal ideation, and polysubstance abuse.  Major injury left wrist, self-induced.  She requires evaluation by psychiatry.  She has been  medically cleared.  CRITICAL CARE-no Performed by: Mancel BaleElliott Iantha Titsworth  Nursing Notes Reviewed/ Care Coordinated Applicable Imaging Reviewed Interpretation of Laboratory Data incorporated into ED treatment  Plan-as per TTS in conjunction with oncoming provider team   Final Clinical Impressions(s) / ED Diagnoses   Final diagnoses:  None  ED Discharge Orders    None       Mancel Bale, MD 04/24/18 [phone]

## 2018-04-25 MED ORDER — CARBAMAZEPINE 100 MG PO CHEW
100.0000 mg | CHEWABLE_TABLET | Freq: Three times a day (TID) | ORAL | Status: DC
  Administered 2018-04-25 – 2018-04-29 (×13): 100 mg via ORAL
  Filled 2018-04-25 (×20): qty 1

## 2018-04-25 MED ORDER — CLONAZEPAM 1 MG PO TABS
1.0000 mg | ORAL_TABLET | Freq: Three times a day (TID) | ORAL | Status: AC
  Administered 2018-04-25 – 2018-04-26 (×4): 1 mg via ORAL
  Filled 2018-04-25 (×4): qty 1

## 2018-04-25 MED ORDER — GABAPENTIN 300 MG PO CAPS
300.0000 mg | ORAL_CAPSULE | Freq: Three times a day (TID) | ORAL | Status: DC
  Administered 2018-04-25 – 2018-04-29 (×13): 300 mg via ORAL
  Filled 2018-04-25 (×19): qty 1

## 2018-04-25 NOTE — Progress Notes (Signed)
Per pt request, CSW left HIPPA compliant voicemail with her employer [employer]) [phone] ext [phone] informing her that pt is hospitalized at "[facility]."   Diany Formosa S. Alan Ripper, MSW, LCSW Clinical Social Worker 04/25/2018 3:45 PM

## 2018-04-25 NOTE — Tx Team (Signed)
Interdisciplinary Treatment and Diagnostic Plan Update  04/25/2018 Time of Session: [place] MRN: [id number]  Principal Diagnosis: MDD, recurrent, severe, without psychotic features  Secondary Diagnoses: Active Problems:   Major depressive disorder, recurrent, severe without psychotic features ([place])   Current Medications:  Current Facility-Administered Medications  Medication Dose Route Frequency Provider Last Rate Last Dose  . acetaminophen (TYLENOL) tablet 650 mg  650 mg Oral Q6H PRN Ethelene Hal, NP      . alum & mag hydroxide-simeth (MAALOX/MYLANTA) 200-200-20 MG/5ML suspension 30 mL  30 mL Oral Q4H PRN Ethelene Hal, NP      . carbamazepine (TEGRETOL) chewable tablet 100 mg  100 mg Oral TID Johnn Hai, MD   100 mg at 04/25/18 0925  . clonazePAM (KLONOPIN) tablet 1 mg  1 mg Oral TID Johnn Hai, MD   1 mg at 04/25/18 0925  . feeding supplement (ENSURE ENLIVE) (ENSURE ENLIVE) liquid 237 mL  237 mL Oral BID BM Patrecia Pour, NP      . gabapentin (NEURONTIN) capsule 300 mg  300 mg Oral TID Johnn Hai, MD   300 mg at 04/25/18 7989  . hydrOXYzine (ATARAX/VISTARIL) tablet 25 mg  25 mg Oral TID PRN Ethelene Hal, NP      . magnesium hydroxide (MILK OF MAGNESIA) suspension 30 mL  30 mL Oral Daily PRN Ethelene Hal, NP      . nicotine (NICODERM CQ - dosed in mg/24 hours) patch 21 mg  21 mg Transdermal Daily Patrecia Pour, NP   21 mg at 04/25/18 0926  . pneumococcal 23 valent vaccine (PNU-IMMUNE) injection 0.5 mL  0.5 mL Intramuscular Tomorrow-1000 Waylan Boga Y, NP      . traZODone (DESYREL) tablet 50 mg  50 mg Oral QHS PRN Ethelene Hal, NP       PTA Medications: Medications Prior to Admission  Medication Sig Dispense Refill Last Dose  . ibuprofen (ADVIL,MOTRIN) 200 MG tablet Take 400-800 mg by mouth every 6 (six) hours as needed for moderate pain.   04/23/2018 at Unknown time    Patient Stressors: Marital or family  conflict Substance abuse  Patient Strengths: Ability for insight Capable of independent living  Treatment Modalities: Medication Management, Group therapy, Case management,  1 to 1 session with clinician, Psychoeducation, Recreational therapy.   Physician Treatment Plan for Primary Diagnosis: MDD, recurrent, severe, without psychotic features Long Term Goal(s): Improvement in symptoms so as ready for discharge Improvement in symptoms so as ready for discharge   Short Term Goals: Ability to demonstrate self-control will improve Ability to identify and develop effective coping behaviors will improve Ability to demonstrate self-control will improve Ability to identify and develop effective coping behaviors will improve  Medication Management: Evaluate patient's response, side effects, and tolerance of medication regimen.  Therapeutic Interventions: 1 to 1 sessions, Unit Group sessions and Medication administration.  Evaluation of Outcomes: Not Met  Physician Treatment Plan for Secondary Diagnosis: Active Problems:   Major depressive disorder, recurrent, severe without psychotic features ([place])  Long Term Goal(s): Improvement in symptoms so as ready for discharge Improvement in symptoms so as ready for discharge   Short Term Goals: Ability to demonstrate self-control will improve Ability to identify and develop effective coping behaviors will improve Ability to demonstrate self-control will improve Ability to identify and develop effective coping behaviors will improve     Medication Management: Evaluate patient's response, side effects, and tolerance of medication regimen.  Therapeutic Interventions: 1 to  1 sessions, Unit Group sessions and Medication administration.  Evaluation of Outcomes: Not Met   RN Treatment Plan for Primary Diagnosis: MDD, recurrent, severe, without psychotic features Long Term Goal(s): Knowledge of disease and therapeutic regimen to maintain health  will improve  Short Term Goals: Ability to remain free from injury will improve, Ability to verbalize frustration and anger appropriately will improve, Ability to verbalize feelings will improve and Ability to disclose and discuss suicidal ideas  Medication Management: RN will administer medications as ordered by provider, will assess and evaluate patient's response and provide education to patient for prescribed medication. RN will report any adverse and/or side effects to prescribing provider.  Therapeutic Interventions: 1 on 1 counseling sessions, Psychoeducation, Medication administration, Evaluate responses to treatment, Monitor vital signs and CBGs as ordered, Perform/monitor CIWA, COWS, AIMS and Fall Risk screenings as ordered, Perform wound care treatments as ordered.  Evaluation of Outcomes: Not Met   LCSW Treatment Plan for Primary Diagnosis: MDD, recurrent, severe, without psychotic features Long Term Goal(s): Safe transition to appropriate next level of care at discharge, Engage patient in therapeutic group addressing interpersonal concerns.  Short Term Goals: Engage patient in aftercare planning with referrals and resources, Facilitate patient progression through stages of change regarding substance use diagnoses and concerns and Identify triggers associated with mental health/substance abuse issues  Therapeutic Interventions: Assess for all discharge needs, 1 to 1 time with Social worker, Explore available resources and support systems, Assess for adequacy in community support network, Educate family and significant other(s) on suicide prevention, Complete Psychosocial Assessment, Interpersonal group therapy.  Evaluation of Outcomes: Not Met   Progress in Treatment: Attending groups: No. Pt new to unit. Continuing to assess.  Participating in groups: No. Taking medication as prescribed: Yes. Toleration medication: Yes. Family/Significant other contact made: No, will contact:   family member/boyfriend if pt consents to collateral contact.  Patient understands diagnosis: Yes. Discussing patient identified problems/goals with staff: Yes. Medical problems stabilized or resolved: Yes. Denies suicidal/homicidal ideation: Yes. Issues/concerns per patient self-inventory: No. Other: n/a   New problem(s) identified: No, Describe:  n/a  New Short Term/Long Term Goal(s): detox, medication management for mood stabilization; elimination of SI thoughts; development of comprehensive mental wellness/sobriety plan.   Patient Goals:  "To work on not feeling suicidal and getting through this relapse. Staying clean."   Discharge Plan or Barriers: CSW assessing for appropriate referrals. [facility] pamphlet, Mobile Crisis information, and AA/NA information provided to patient for additional community support and resources.   Reason for Continuation of Hospitalization: Anxiety Depression Medication stabilization Suicidal ideation Withdrawal symptoms  Estimated Length of Stay: Wed, 04/27/2018  Attendees: Patient: Gail Castro  04/25/2018 9:35 AM  Physician: Dr. Jake Samples MD 04/25/2018 9:35 AM  Nursing: Yetta Flock RN; [facility] RN 04/25/2018 9:35 AM  RN Care Manager:x 04/25/2018 9:35 AM  Social Worker: Janice Norrie LCSW 04/25/2018 9:35 AM  Recreational Therapist: x 04/25/2018 9:35 AM  Other: Lindell Spar NP 04/25/2018 9:35 AM  Other:  04/25/2018 9:35 AM  Other: 04/25/2018 9:35 AM    Scribe for Treatment Team: Avelina Laine, LCSW 04/25/2018 9:35 AM

## 2018-04-25 NOTE — H&P (Signed)
Psychiatric Admission Assessment Adult  Patient Identification: Gail Castro MRN:  [id number] Date of Evaluation:  04/25/2018 Chief Complaint:  Bipolar Depressed Substance abuse disorder Principal Diagnosis: Suicidality in the context of a relapse regarding polysubstance dependence and abuse Diagnosis:  Active Problems:   Major depressive disorder, recurrent, severe without psychotic features (HCC)  History of Present Illness:   [name] is 38 years of age and she presented after being found by her boyfriend with wrist lacerations/superficial, and holding a gun to her head, she had become suicidal, frustrated at a relapse on cocaine, Xanax and alcohol. On presentation she was sluggish and groggy her alcohol level was negligible but drug screen showed benzodiazepines and cocaine.  The patient reports a long history of chemical dependency issues states she was clean for 18 months after she had been treated, entering into a year-long program in the [place] area,.  She had sought further rehab at [facility] and again was clean 18 months after that period of time but states she relapsed 1 month ago.  She states she abuses cocaine, "shoots heroin" though it was not in her system, and has been drinking heavily intermittently however but most worrisome is her use of Xanax, she is buying Xanax off the street taking at least 4 mg a day, sometimes will obtain the 2 mg strength so she is possibly taking up to 8 mg a day.  States she is never had a seizure and has never had psychotic symptoms states she "never lets her self go that long" because after 3 days because she becomes antsy and relapses, so the longer she will go without any of these compounds is about 2-1/2 3 days.  She is not suicidal now she can contract for safety while here. She is having some cramping and cravings  Past diagnoses have included bipolar type symptomatology but always in the context of polysubstance  use/withdrawal/intoxication,  Other diagnoses include history of hepatitis C, history of MRSA, tobacco abuse/history of cellulitis  At the present time she is alert and oriented and cooperative eye contact is fair stating her boyfriend does not use compound so she really should be safe at home as far as risk of relapse she denies wanting to harm others  Associated Signs/Symptoms: Depression Symptoms:  depressed mood, (Hypo) Manic Symptoms:  Impulsivity, Anxiety Symptoms:  Excessive Worry, Psychotic Symptoms:  n/a PTSD Symptoms: NA Total Time spent with patient: 45 minutes  Past Psychiatric History: Previous treatment at a residential facility as well as [facility]  Is the patient at risk to self? Yes.    Has the patient been a risk to self in the past 6 months? Yes.    Has the patient been a risk to self within the distant past? Yes.    Is the patient a risk to others? No.  Has the patient been a risk to others in the past 6 months? No.  Has the patient been a risk to others within the distant past? No.   Prior Inpatient Therapy:   Prior Outpatient Therapy:    Alcohol Screening: 1. How often do you have a drink containing alcohol?: 4 or more times a week 2. How many drinks containing alcohol do you have on a typical day when you are drinking?: 7, 8, or 9 3. How often do you have six or more drinks on one occasion?: Daily or almost daily AUDIT-C Score: 11 4. How often during the last year have you found that you were not able  to stop drinking once you had started?: Monthly 5. How often during the last year have you failed to do what was normally expected from you becasue of drinking?: Monthly 6. How often during the last year have you needed a first drink in the morning to get yourself going after a heavy drinking session?: Never 7. How often during the last year have you had a feeling of guilt of remorse after drinking?: Weekly 8. How often during the last year have you been unable to  remember what happened the night before because you had been drinking?: Monthly 9. Have you or someone else been injured as a result of your drinking?: No 10. Has a relative or friend or a doctor or another health worker been concerned about your drinking or suggested you cut down?: Yes, during the last year Alcohol Use Disorder Identification Test Final Score (AUDIT): 24 Alcohol Brief Interventions/Follow-up: Alcohol Education, Brief Advice Substance Abuse History in the last 12 months:  Yes.   Consequences of Substance Abuse: Medical Consequences:  Obvious withdrawal risk of seizures Previous Psychotropic Medications: Yes  Psychological Evaluations: No  Past Medical History:  Past Medical History:  Diagnosis Date  . Anxiety   . Bipolar 1 disorder (HCC)   . Cocaine use   . ETOH abuse   . Hepatitis C   . Heroin abuse (HCC)   . MRSA (methicillin resistant Staphylococcus aureus)   . Suicide and self-inflicted injury (HCC)   . Suicide ideation   . Tobacco abuse     Past Surgical History:  Procedure Laterality Date  . arm    . CHOLECYSTECTOMY    . EYE SURGERY    . TUBAL LIGATION     Family History: History reviewed. No pertinent family history. Family Psychiatric  History: Believes to be present does not elaborate Tobacco Screening: Have you used any form of tobacco in the last 30 days? (Cigarettes, Smokeless Tobacco, Cigars, and/or Pipes): Yes Tobacco use, Select all that apply: 4 or less cigarettes per day Are you interested in Tobacco Cessation Medications?: Yes, will notify MD for an order Counseled patient on smoking cessation including recognizing danger situations, developing coping skills and basic information about quitting provided: Yes Social History: This with boyfriend Social History   Substance and Sexual Activity  Alcohol Use Yes     Social History   Substance and Sexual Activity  Drug Use Yes  . Types: Cocaine, Marijuana    Allergies:  No Known  Allergies Lab Results:  Results for orders placed or performed during the hospital encounter of 04/24/18 (from the past 48 hour(s))  Comprehensive metabolic panel     Status: None   Collection Time: 04/24/18  6:59 AM  Result Value Ref Range   Sodium 137 135 - 145 mmol/L   Potassium 3.6 3.5 - 5.1 mmol/L   Chloride 103 98 - 111 mmol/L   CO2 25 22 - 32 mmol/L   Glucose, Bld 94 70 - 99 mg/dL   BUN 12 6 - 20 mg/dL   Creatinine, Ser 6.19 0.44 - 1.00 mg/dL   Calcium 9.1 8.9 - 50.9 mg/dL   Total Protein 7.4 6.5 - 8.1 g/dL   Albumin 4.2 3.5 - 5.0 g/dL   AST 31 15 - 41 U/L   ALT 43 0 - 44 U/L   Alkaline Phosphatase 54 38 - 126 U/L   Total Bilirubin 0.9 0.3 - 1.2 mg/dL   GFR calc non Af Amer >60 >60 mL/min   GFR calc  Af Amer >60 >60 mL/min   Anion gap 9 5 - 15    Comment: Performed at [facility], [address] [place], [place] [address]  Ethanol     Status: None   Collection Time: 04/24/18  6:59 AM  Result Value Ref Range   Alcohol, Ethyl (B) <10 <10 mg/dL    Comment: (NOTE) Lowest detectable limit for serum alcohol is 10 mg/dL. For medical purposes only. Performed at [facility], [address] [place], [place] [address]   Salicylate level     Status: None   Collection Time: 04/24/18  6:59 AM  Result Value Ref Range   Salicylate Lvl <7.0 2.8 - 30.0 mg/dL    Comment: Performed at [facility], [address], [place], [place] [address]  Acetaminophen level     Status: Abnormal   Collection Time: 04/24/18  6:59 AM  Result Value Ref Range   Acetaminophen (Tylenol), Serum <10 (L) 10 - 30 ug/mL    Comment: (NOTE) Therapeutic concentrations vary significantly. A range of 10-30 ug/mL  may be an effective concentration for many patients. However, some  are best treated at concentrations outside of this range. Acetaminophen concentrations >150 ug/mL at 4 hours after ingestion  and >50 ug/mL at 12 hours after ingestion are  often associated with  toxic reactions. Performed at [facility], [address] [place], [place] [address]   cbc     Status: None   Collection Time: 04/24/18  6:59 AM  Result Value Ref Range   WBC 9.4 4.0 - 10.5 K/uL   RBC 4.77 3.87 - 5.11 MIL/uL   Hemoglobin 14.1 12.0 - 15.0 g/dL   HCT 65.7 84.6 - 96.2 %   MCV 91.6 80.0 - 100.0 fL   MCH 29.6 26.0 - 34.0 pg   MCHC 32.3 30.0 - 36.0 g/dL   RDW 95.2 84.1 - 32.4 %   Platelets 298 150 - 400 K/uL   nRBC 0.0 0.0 - 0.2 %    Comment: Performed at [facility], [address], [place], [place] [address]  Rapid urine drug screen (hospital performed)     Status: Abnormal   Collection Time: 04/24/18  6:59 AM  Result Value Ref Range   Opiates NONE DETECTED NONE DETECTED   Cocaine POSITIVE (A) NONE DETECTED   Benzodiazepines POSITIVE (A) NONE DETECTED   Amphetamines NONE DETECTED NONE DETECTED   Tetrahydrocannabinol NONE DETECTED NONE DETECTED   Barbiturates NONE DETECTED NONE DETECTED    Comment: (NOTE) DRUG SCREEN FOR MEDICAL PURPOSES ONLY.  IF CONFIRMATION IS NEEDED FOR ANY PURPOSE, NOTIFY LAB WITHIN 5 DAYS. LOWEST DETECTABLE LIMITS FOR URINE DRUG SCREEN Drug Class                     Cutoff (ng/mL) Amphetamine and metabolites    1000 Barbiturate and metabolites    200 Benzodiazepine                 200 Tricyclics and metabolites     300 Opiates and metabolites        300 Cocaine and metabolites        300 THC                            50 Performed at [facility], [address] [place], [place] [address]   I-Stat beta hCG blood, ED  Status: None   Collection Time: 04/24/18  7:06 AM  Result Value Ref Range   I-stat hCG, quantitative <5.0 <5 mIU/mL   Comment 3            Comment:   GEST. AGE      CONC.  (mIU/mL)   <=1 WEEK        5 - 50     2 WEEKS       50 - 500     3 WEEKS       100 - 10,000     4 WEEKS     1,000 - 30,000        FEMALE AND NON-PREGNANT  FEMALE:     LESS THAN 5 mIU/mL     Blood Alcohol level:  Lab Results  Component Value Date   ETH <10 04/24/2018   ETH <5 05/16/2014    Metabolic Disorder Labs:  No results found for: HGBA1C, MPG No results found for: PROLACTIN No results found for: CHOL, TRIG, HDL, CHOLHDL, VLDL, LDLCALC  Current Medications: Current Facility-Administered Medications  Medication Dose Route Frequency Provider Last Rate Last Dose  . acetaminophen (TYLENOL) tablet 650 mg  650 mg Oral Q6H PRN Laveda AbbeParks, Laurie Britton, NP      . alum & mag hydroxide-simeth (MAALOX/MYLANTA) 200-200-20 MG/5ML suspension 30 mL  30 mL Oral Q4H PRN Laveda AbbeParks, Laurie Britton, NP      . feeding supplement (ENSURE ENLIVE) (ENSURE ENLIVE) liquid 237 mL  237 mL Oral BID BM Charm RingsLord, Jamison Y, NP      . gabapentin (NEURONTIN) capsule 200 mg  200 mg Oral TID Laveda AbbeParks, Laurie Britton, NP   200 mg at 04/24/18 [id number]  . hydrOXYzine (ATARAX/VISTARIL) tablet 25 mg  25 mg Oral TID PRN Laveda AbbeParks, Laurie Britton, NP      . magnesium hydroxide (MILK OF MAGNESIA) suspension 30 mL  30 mL Oral Daily PRN Laveda AbbeParks, Laurie Britton, NP      . nicotine (NICODERM CQ - dosed in mg/24 hours) patch 21 mg  21 mg Transdermal Daily Nanine MeansLord, Jamison Y, NP      . pneumococcal 23 valent vaccine (PNU-IMMUNE) injection 0.5 mL  0.5 mL Intramuscular Tomorrow-1000 Nanine MeansLord, Jamison Y, NP      . traZODone (DESYREL) tablet 50 mg  50 mg Oral QHS PRN Laveda AbbeParks, Laurie Britton, NP       PTA Medications: Medications Prior to Admission  Medication Sig Dispense Refill Last Dose  . ibuprofen (ADVIL,MOTRIN) 200 MG tablet Take 400-800 mg by mouth every 6 (six) hours as needed for moderate pain.   04/23/2018 at Unknown time    Musculoskeletal: Strength & Muscle Tone: spastic Gait & Station: normal Patient leans: N/A  Psychiatric Specialty Exam: Physical Exam  ROS  Blood pressure (!) 109/95, pulse 82, temperature 98 F (36.7 C), temperature source Oral, resp. rate 18, height 5\' 9"  (1.753 m), weight  66.7 kg.Body mass index is 21.71 kg/m.  General Appearance: Casual  Eye Contact:  Fair  Speech:  Clear and Coherent  Volume:  Decreased  Mood:  Anxious and Depressed  Affect:  Flat  Thought Process:  Coherent  Orientation:  Full (Time, Place, and Person)  Thought Content:  Rumination  Suicidal Thoughts:  No  Homicidal Thoughts:  No  Memory:  3/3 2/3  Judgement:  Intact  Insight:  Fair  Psychomotor Activity:  Normal  Concentration:  Concentration: Fair  Recall:  [employer]Fair  Fund of Knowledge:  Fair  Language:  Fair  Akathisia:  Negative  Handed:  Right  AIMS (if indicated):     Assets:  Physical Health  ADL's:  Intact  Cognition:  WNL  Sleep:  Number of Hours: 6.25    Treatment Plan Summary: Daily contact with patient to assess and evaluate symptoms and progress in treatment, Medication management and Plan Detox predominantly from Xanax and that will cover the other withdrawal symptoms the biggest risk to her is going to be seizure risk also address depression  Observation Level/Precautions:  15 minute checks  Laboratory:  UDS  Psychotherapy: Rehab based  Medications: Multiple detox measures  Consultations: Not necessary  Discharge Concerns: Long-term compliance and safety  Estimated LOS: 3-5  Other:    Axis I- benzodiazepine/Xanax dependency and abuse Alcohol abuse Heroin abuse Cocaine dependence/binging 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  consider Axis II pathology Axis III-as mentioned at risk for seizures   Physician Treatment Plan for Primary Diagnosis: <principal problem not specified> Long Term Goal(s): Improvement in symptoms so as ready for discharge  Short Term Goals: Ability to demonstrate self-control will improve and Ability to identify and develop effective coping behaviors will improve  Physician Treatment Plan for Secondary Diagnosis: Active Problems:   Major depressive disorder, recurrent, severe without psychotic  features (HCC)  Long Term Goal(s): Improvement in symptoms so as ready for discharge  Short Term Goals: Ability to demonstrate self-control will improve and Ability to identify and develop effective coping behaviors will improve  I certify that inpatient services furnished can reasonably be expected to improve the patient's condition.    Malvin Johns, MD 2/17/20208:10 AM

## 2018-04-25 NOTE — [facility] Group Notes (Signed)
LCSW Group Therapy Note   04/25/2018 1:15pm   Type of Therapy and Topic:  Group Therapy:  Overcoming Obstacles   Participation Level:  Did Not Attend--pt invited. Chose to remain in bed.    Description of Group:    In this group patients will be encouraged to explore what they see as obstacles to their own wellness and recovery. They will be guided to discuss their thoughts, feelings, and behaviors related to these obstacles. The group will process together ways to cope with barriers, with attention given to specific choices patients can make. Each patient will be challenged to identify changes they are motivated to make in order to overcome their obstacles. This group will be process-oriented, with patients participating in exploration of their own experiences as well as giving and receiving support and challenge from other group members.   Therapeutic Goals: 1. Patient will identify personal and current obstacles as they relate to admission. 2. Patient will identify barriers that currently interfere with their wellness or overcoming obstacles.  3. Patient will identify feelings, thought process and behaviors related to these barriers. 4. Patient will identify two changes they are willing to make to overcome these obstacles:      Summary of Patient Progress   x   Therapeutic Modalities:   Cognitive Behavioral Therapy Solution Focused Therapy Motivational Interviewing Relapse Prevention Therapy  [name], LCSW 04/25/2018 11:32 AM

## 2018-04-25 NOTE — Progress Notes (Signed)
NUTRITION ASSESSMENT  Pt identified as at risk on the Malnutrition Screen Tool  INTERVENTION: 1. Supplements: Continue Ensure Enlive po BID, each supplement provides 350 kcal and 20 grams of protein  NUTRITION DIAGNOSIS: Unintentional weight loss related to sub-optimal intake as evidenced by pt report.   Goal: Pt to meet >/= 90% of their estimated nutrition needs.  Monitor:  PO intake  Assessment:  Pt admitted with depression, SI and substance abuse (benzos, cocaine, ETOH). Per weight records ,pt's weight tends to stay between 147-157 lb. Ensure supplements have been ordered, will continue.  Height: Ht Readings from Last 1 Encounters:  04/24/18 5\' 9"  (1.753 m)    Weight: Wt Readings from Last 1 Encounters:  04/24/18 66.7 kg    Weight Hx: Wt Readings from Last 10 Encounters:  04/24/18 66.7 kg  04/24/18 71.7 kg  07/08/16 69.9 kg  02/12/15 65.8 kg  08/10/14 65.8 kg  05/16/14 63.5 kg  09/09/12 63.8 kg  08/23/12 61.8 kg  05/12/12 64.9 kg  09/24/11 79.4 kg    BMI:  Body mass index is 21.71 kg/m. Pt meets criteria for normal based on current BMI.  Estimated Nutritional Needs: Kcal: 25-30 kcal/kg Protein: > 1 gram protein/kg Fluid: 1 ml/kcal  Diet Order:  Diet Order            Diet regular Room service appropriate? No; Fluid consistency: Thin  Diet effective now             Pt is also offered choice of unit snacks mid-morning and mid-afternoon.  Pt is eating as desired.   Lab results and medications reviewed.   Tilda Franco, MS, RD, LDN [facility] Inpatient Clinical Dietitian Pager: [phone] After Hours Pager: [phone]

## 2018-04-25 NOTE — Progress Notes (Signed)
D:  Patient denied SI and HI, contracts for safety.  Denied A/V hallucinations.   A:  Medications administered per MD orders.  Emotional support and encouragement given patient. R:  Safety maintained with 15 minute checks.  

## 2018-04-25 NOTE — Plan of Care (Signed)
Nurse discussed anxiety, depression and coping skills with patient.  

## 2018-04-25 NOTE — Progress Notes (Signed)
Patient did not attend AA group meeting. 
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
## 2018-04-25 NOTE — Progress Notes (Signed)
Recreation Therapy Notes  Date:  2.17.20 Time: 0930 Location: 300 Hall Dayroom  Group Topic: Stress Management  Goal Area(s) Addresses:  Patient will identify positive stress management techniques. Patient will identify benefits of using stress management post d/c.  Intervention:  Stress Management  Activity :  Meditation.  LRT introduced the stress management technique of meditation.  LRT played a meditation that focused on impermanence.  Patients were to listen and follow as meditation played to engaged in the activity.   Education:  Stress Management, Discharge Planning.   Education Outcome: Acknowledges Education  Clinical Observations/Feedback: Pt did not attend group.     Caroll Rancher, LRT/CTRS         Lillia Abed, Isahi Godwin A 04/25/2018 11:06 AM
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
## 2018-04-25 NOTE — [facility] Suicide Risk Assessment (Signed)
[facility] INPATIENT:  Family/Significant Other Suicide Prevention Education  Suicide Prevention Education:  Education Completed; Gail Castro (pt's boyfriend/roomate) [phone] has been identified by the patient as the family member/significant other with whom the patient will be residing, and identified as the person(s) who will aid the patient in the event of a mental health crisis (suicidal ideations/suicide attempt).  With written consent from the patient, the family member/significant other has been provided the following suicide prevention education, prior to the and/or following the discharge of the patient.  The suicide prevention education provided includes the following:  Suicide risk factors  Suicide prevention and interventions  National Suicide Hotline telephone number  [facility] assessment telephone number  [facility] Emergency Assistance 911  [facility] and/or Residential Mobile Crisis Unit telephone number  Request made of family/significant other to:  Remove weapons (e.g., guns, rifles, knives), all items previously/currently identified as safety concern.    Remove drugs/medications (over-the-counter, prescriptions, illicit drugs), all items previously/currently identified as a safety concern.  The family member/significant other verbalizes understanding of the suicide prevention education information provided.  The family member/significant other agrees to remove the items of safety concern listed above.  SPE and aftercare reviewed with pt's boyfriend. He shared that even when pt is not using drugs, she has "severe mood swings with paranoia and anger outbursts." He reports that she has had these episodes for many months and "they come out of nowhere and her mood can change within minutes." Pt's boyfriend confirmed that he locked up his gun in a safe and she does not know the code. He has no other safety concerns regarding pt returning home at discharge.    Gail Ravens LCSW 04/25/2018, 3:39 PM

## 2018-04-26 DIAGNOSIS — F332 Major depressive disorder, recurrent severe without psychotic features: Principal | ICD-10-CM

## 2018-04-26 MED ORDER — CLONAZEPAM 0.5 MG PO TABS
0.5000 mg | ORAL_TABLET | Freq: Three times a day (TID) | ORAL | Status: AC
  Administered 2018-04-26 – 2018-04-27 (×3): 0.5 mg via ORAL
  Filled 2018-04-26 (×3): qty 1

## 2018-04-26 NOTE — Progress Notes (Signed)
Pt with visitor at shift change.  Pt denies pin or discomfort.  Pt denies SI, Hi and AVh and contracts for safety.  Pt sts she just wants some drugs. Pt is med compliant.  Pt returns to room after visit and does not attend group. Pt remains safe on unit

## 2018-04-26 NOTE — Progress Notes (Signed)
Pt observe in dayroom, eating a snack. Pt presents with flat/irritable affect and mood. Pt denies HI/AVH/Pain at this time. Endorses passive SI but verbal contracts for safety. Pt states "My cravings are bad right now". Vistaril and trazodone requested and given. Support offered. Will continue with POC.

## 2018-04-26 NOTE — B[facility] Group Notes (Signed)
[facility] Mental Health Association Group Therapy 04/26/2018 1:15pm  Type of Therapy: Mental Health Association Presentation  Participation Level: Active  Participation Quality: Attentive  Affect: Appropriate  Cognitive: Oriented  Insight: Developing/Improving  Engagement in Therapy: Engaged  Modes of Intervention: Discussion, Education and Socialization  Summary of Progress/Problems: Mental Health Association (MHA) Speaker came to talk about his personal journey with mental health. The pt processed ways by which to relate to the speaker. MHA speaker provided handouts and educational information pertaining to groups and services offered by the [facility]. Pt was engaged in speaker's presentation and was receptive to resources provided.    [name], LCSW 04/26/2018 1:55 PM

## 2018-04-26 NOTE — Progress Notes (Signed)
Adult Psychoeducational Group Note  Date:  04/26/2018 Time:  11:51 PM  Group Topic/Focus:  Wrap-Up Group:   The focus of this group is to help patients review their daily goal of treatment and discuss progress on daily workbooks.  Participation Level:  Active  Participation Quality:  Appropriate  Affect:  Appropriate  Cognitive:  Appropriate  Insight: Appropriate  Engagement in Group:  Engaged  Modes of Intervention:  Discussion  Additional Comments:  Patient attended wrap-up group and participated.   Kalana Yust W Gertie Broerman 04/26/2018, 11:51 PM

## 2018-04-26 NOTE — Plan of Care (Signed)
Nurse discussed anxiety, depression, coping skills with patient. 

## 2018-04-26 NOTE — Progress Notes (Signed)
Pt's boyfriend contacted CSW to make mention of pt's ongoing cravings and withdrawals. CSW shared that MD is aware and has met with pt this morning to discuss issues and have been addressing her withdrawals and cravings. Pt's boyfriend provided CSW with pt's employer e-mail, which he and pt both requested a hospital note be emailed with admission date. CSW emailed employer HIPPA compliant hospital note indicating admission and tentative discharge for later this week. E-mail sent to: [email or web address]. Copy of letter provided to pt for her records.   Tyera Hansley S. Ouida Sills, MSW, LCSW Clinical Social Worker 04/26/2018 10:30 AM

## 2018-04-26 NOTE — Progress Notes (Signed)
D:  Patient's self inventory sheet, patient has fair sleep, no sleep medication.  Good appetite, low energy level, poor concentration.  Rated depression, hopeless and anxiety 9.  Withdrawals, chilling, cravings, agitation, irritability.  SI, contracts for safety, no plan.  Denied physical pain.  Goal is "sleep".  Plans to "sleep".  No discharge plans. A:  Medications administered per MD orders.  Emotional support and encouragement given patient. R:  Denied HI.  Denied A/V hallucinations.  SI, no plan, contracts for safety. Patient sleeps, gets out of bed and asks for more medications, then gets out of bed for meds, throughout the day.

## 2018-04-26 NOTE — Progress Notes (Signed)
Recreation Therapy Notes  Animal-Assisted Activity (AAA) Program Checklist/Progress Notes Patient Eligibility Criteria Checklist & Daily Group note for Rec Tx Intervention  Date: 2.18. 20 Time: 1430 Location: 400 [address] Dayroom  AAA/T Program Assumption of Risk Form signed by Patient/ or Parent Legal Guardian  YES   Patient is free of allergies or sever asthma   YES   Patient reports no fear of animals  YES   Patient reports no history of cruelty to animals  YES   Patient understands his/her participation is voluntary  YES   Patient washes hands before animal contact  YES  Patient washes hands after animal contact  YES   Education: Hand Washing, Appropriate Animal Interaction   Education Outcome: Acknowledges understanding/In group clarification offered/Needs additional education.   Clinical Observations/Feedback: Pt did not attend group activity.    Hennie Gosa, LRT/CTRS         Gail Castro 04/26/2018 3:12 PM 

## 2018-04-26 NOTE — Progress Notes (Signed)
[facility]  04/26/2018 9:22 AM Gail HackCatrina L Newman  MRN:  [id number] Subjective:    Patient in bed still complaining of some malaise, dysphoria, but no thoughts of harming herself at this point time- No firm plans for rehab but it is discussed, No current thoughts of harming self Generally in bed and not very active but denies cramping, denies craving  Principal Problem: Substance abuse and dependency, depression, most worrisome is high-dose Xanax dependency for a month  diagnosis: Active Problems:   Major depressive disorder, recurrent, severe without psychotic features (HCC)  Total Time spent with patient: 20 minutes   Past Medical History:  Past Medical History:  Diagnosis Date  . Anxiety   . Bipolar 1 disorder (HCC)   . Cocaine use   . ETOH abuse   . Hepatitis C   . Heroin abuse (HCC)   . MRSA (methicillin resistant Staphylococcus aureus)   . Suicide and self-inflicted injury (HCC)   . Suicide ideation   . Tobacco abuse     Past Surgical History:  Procedure Laterality Date  . arm    . CHOLECYSTECTOMY    . EYE SURGERY    . TUBAL LIGATION     Family History: History reviewed. No pertinent family history.  Social History:  Social History   Substance and Sexual Activity  Alcohol Use Yes     Social History   Substance and Sexual Activity  Drug Use Yes  . Types: Cocaine, Marijuana    Social History   Socioeconomic History  . Marital status: Divorced    Spouse name: Not on file  . Number of children: Not on file  . Years of education: Not on file  . Highest education level: Not on file  Occupational History  . Not on file  Social Needs  . Financial resource strain: Not on file  . Food insecurity:    Worry: Not on file    Inability: Not on file  . Transportation needs:    Medical: Not on file    Non-medical: Not on file  Tobacco Use  . Smoking status: Current Every Day Smoker    Packs/day: 1.00    Types: Cigarettes  . Smokeless tobacco:  Never Used  Substance and Sexual Activity  . Alcohol use: Yes  . Drug use: Yes    Types: Cocaine, Marijuana  . Sexual activity: Yes    Birth control/protection: Surgical  Lifestyle  . Physical activity:    Days per week: Not on file    Minutes per session: Not on file  . Stress: Not on file  Relationships  . Social connections:    Talks on phone: Not on file    Gets together: Not on file    Attends religious service: Not on file    Active member of club or organization: Not on file    Attends meetings of clubs or organizations: Not on file    Relationship status: Not on file  Other Topics Concern  . Not on file  Social History Narrative  . Not on file   Additional Social History:                         Sleep: Good  Appetite:  Good  Current Medications: Current Facility-Administered Medications  Medication Dose Route Frequency Provider Last Rate Last Dose  . acetaminophen (TYLENOL) tablet 650 mg  650 mg Oral Q6H PRN Laveda AbbeParks, Laurie Britton, NP   650 mg at  04/25/18 1546  . alum & mag hydroxide-simeth (MAALOX/MYLANTA) 200-200-20 MG/5ML suspension 30 mL  30 mL Oral Q4H PRN Laveda Abbe, NP      . carbamazepine (TEGRETOL) chewable tablet 100 mg  100 mg Oral TID Malvin Johns, MD   100 mg at 04/26/18 2703  . clonazePAM (KLONOPIN) tablet 0.5 mg  0.5 mg Oral TID Malvin Johns, MD      . feeding supplement (ENSURE ENLIVE) (ENSURE ENLIVE) liquid 237 mL  237 mL Oral BID BM Charm Rings, NP      . gabapentin (NEURONTIN) capsule 300 mg  300 mg Oral TID Malvin Johns, MD   300 mg at 04/26/18 5009  . hydrOXYzine (ATARAX/VISTARIL) tablet 25 mg  25 mg Oral TID PRN Laveda Abbe, NP   25 mg at 04/25/18 2135  . magnesium hydroxide (MILK OF MAGNESIA) suspension 30 mL  30 mL Oral Daily PRN Laveda Abbe, NP      . nicotine (NICODERM CQ - dosed in mg/24 hours) patch 21 mg  21 mg Transdermal Daily Charm Rings, NP   21 mg at 04/26/18 0853  . pneumococcal 23  valent vaccine (PNU-IMMUNE) injection 0.5 mL  0.5 mL Intramuscular Tomorrow-1000 Lord, Jamison Y, NP      . traZODone (DESYREL) tablet 50 mg  50 mg Oral QHS PRN Laveda Abbe, NP   50 mg at 04/25/18 2135    Lab Results: No results found for this or any previous visit (from the past 48 hour(s)).  Blood Alcohol level:  Lab Results  Component Value Date   ETH <10 04/24/2018   ETH <5 05/16/2014    Metabolic Disorder Labs: No results found for: HGBA1C, MPG No results found for: PROLACTIN No results found for: CHOL, TRIG, HDL, CHOLHDL, VLDL, LDLCALC  Physical Findings: AIMS: Facial and Oral Movements Muscles of Facial Expression: None, normal Lips and Perioral Area: None, normal Jaw: None, normal Tongue: None, normal,Extremity Movements Upper (arms, wrists, hands, fingers): None, normal Lower (legs, knees, ankles, toes): None, normal, Trunk Movements Neck, shoulders, hips: None, normal, Overall Severity Severity of abnormal movements (highest score from questions above): None, normal Incapacitation due to abnormal movements: None, normal Patient's awareness of abnormal movements (rate only patient's report): No Awareness, Dental Status Current problems with teeth and/or dentures?: No Does patient usually wear dentures?: No  CIWA:  CIWA-Ar Total: 8 COWS:  COWS Total Score: 10  Musculoskeletal: Strength & Muscle Tone: within normal limits Gait & Station: normal Patient leans: N/A  Psychiatric Specialty Exam: Physical Exam  ROS  Blood pressure 109/79, pulse 80, temperature 97.9 F (36.6 C), temperature source Oral, resp. rate 18, height 5\' 9"  (1.753 m), weight 66.7 kg, SpO2 99 %.Body mass index is 21.71 kg/m.  General Appearance: Casual  Eye Contact:  Good at times  Speech:  Clear and Coherent  Volume:  Decreased  Mood:  Anxious and Dysphoric  Affect:  Blunt  Thought Process:  Linear  Orientation:  Full (Time, Place, and Person)  Thought Content:  Logical   Suicidal Thoughts:  No  Homicidal Thoughts:  No  Memory:  Immediate;   Fair  Judgement:  Intact  Insight:  Fair  Psychomotor Activity:  Decreased  Concentration:  Concentration: Fair  Recall:  [employer] of Knowledge:  Fair  Language:  Fair  Akathisia:  NA  Handed:  Right  AIMS (if indicated):     Assets:  Resilience  ADL's:  Intact  Cognition:  WNL  Sleep:  Number of Hours: 6.75     Treatment Plan Summary: Daily contact with patient to assess and evaluate symptoms and progress in treatment and Medication management-continue current detox measures probable discharge tomorrow continue current rehab based therapies and cognitive based therapies  Malvin Johns, MD 04/26/2018, 9:22 AM

## 2018-04-27 MED ORDER — VORTIOXETINE HBR 10 MG PO TABS
10.0000 mg | ORAL_TABLET | Freq: Every day | ORAL | Status: DC
  Administered 2018-04-27 – 2018-04-29 (×3): 10 mg via ORAL
  Filled 2018-04-27 (×5): qty 1

## 2018-04-27 MED ORDER — CLONAZEPAM 0.5 MG PO TABS
0.2500 mg | ORAL_TABLET | Freq: Two times a day (BID) | ORAL | Status: AC
  Administered 2018-04-27 – 2018-04-28 (×3): 0.25 mg via ORAL
  Filled 2018-04-27 (×3): qty 1

## 2018-04-27 NOTE — Progress Notes (Signed)
CSW and pt left message for pt's HR rep from work requesting a call back. Pt would like to ask if she could attend a 28 day program such as [employer] without losing her job. Pt gave permission for CSW to speak to [facility] regarding this question and gave permission for CSW to send referral to [facility]. Pt plans to follow-up outpatient if her job will not allow her to miss work.  Stratton Villwock S. Alan Ripper, MSW, LCSW Clinical Social Worker 04/27/2018 2:03 PM

## 2018-04-27 NOTE — Progress Notes (Signed)
Nursing Progress Note: 7p-7a D: Pt currently presents with a depressed/anxious affect and behavior. Interacting appropriately with the milieu. Pt reports good sleep during the previous night with current medication regimen. Pt did attend wrap-up group.  A: Pt provided with medications per providers orders. Pt's labs and vitals were monitored throughout the night. Pt supported emotionally and encouraged to express concerns and questions. Pt educated on medications.  R: Pt's safety ensured with 15 minute and environmental checks. Pt currently denies SI, HI, and AVH. Pt verbally contracts to seek staff if SI,HI, or AVH occurs and to consult with staff before acting on any harmful thoughts. Will continue to monitor.  

## 2018-04-27 NOTE — Progress Notes (Signed)
Recreation Therapy Notes  Date:  2.19. 20 Time: 0930 Location: 300 Hall Dayroom  Group Topic: Stress Management  Goal Area(s) Addresses:  Patient will identify positive stress management techniques. Patient will identify benefits of using stress management post d/c.  Intervention: Stress Management  Activity :  Meditation.  LRT introduced the stress management technique of meditation.  LRT played a meditation that focused on pure possibilities.  Patients were to listen and follow along as meditation played to engage in activity.    Education:  Stress Management, Discharge Planning.   Education Outcome: Acknowledges Education  Clinical Observations/Feedback: Pt did not attend group.     Caroll Rancher, LRT/CTRS          Lillia Abed, Noralyn Karim A 04/27/2018 11:07 AM

## 2018-04-27 NOTE — Progress Notes (Signed)
[facility] MD Progress Note  04/27/2018 9:52 AM Gail Castro  MRN:  [id number] Subjective:   Patient is usually in bed during rounds she is a little irritable today stating she wants an antidepressant and her detox is progressing again she has minimal withdrawal symptoms the regimen seems very appropriate vital signs are stable no involuntary movements no seizure activity no psychosis.  Principal Problem: Substance dependence and abuse/depression Diagnosis: Active Problems:   Major depressive disorder, recurrent, severe without psychotic features (HCC)  Total Time spent with patient: 20 minutes  Past Medical History:  Past Medical History:  Diagnosis Date  . Anxiety   . Bipolar 1 disorder (HCC)   . Cocaine use   . ETOH abuse   . Hepatitis C   . Heroin abuse (HCC)   . MRSA (methicillin resistant Staphylococcus aureus)   . Suicide and self-inflicted injury (HCC)   . Suicide ideation   . Tobacco abuse     Past Surgical History:  Procedure Laterality Date  . arm    . CHOLECYSTECTOMY    . EYE SURGERY    . TUBAL LIGATION     Family History: History reviewed. No pertinent family history.  Social History:  Social History   Substance and Sexual Activity  Alcohol Use Yes     Social History   Substance and Sexual Activity  Drug Use Yes  . Types: Cocaine, Marijuana    Social History   Socioeconomic History  . Marital status: Divorced    Spouse name: Not on file  . Number of children: Not on file  . Years of education: Not on file  . Highest education level: Not on file  Occupational History  . Not on file  Social Needs  . Financial resource strain: Not on file  . Food insecurity:    Worry: Not on file    Inability: Not on file  . Transportation needs:    Medical: Not on file    Non-medical: Not on file  Tobacco Use  . Smoking status: Current Every Day Smoker    Packs/day: 1.00    Types: Cigarettes  . Smokeless tobacco: Never Used  Substance and Sexual Activity   . Alcohol use: Yes  . Drug use: Yes    Types: Cocaine, Marijuana  . Sexual activity: Yes    Birth control/protection: Surgical  Lifestyle  . Physical activity:    Days per week: Not on file    Minutes per session: Not on file  . Stress: Not on file  Relationships  . Social connections:    Talks on phone: Not on file    Gets together: Not on file    Attends religious service: Not on file    Active member of club or organization: Not on file    Attends meetings of clubs or organizations: Not on file    Relationship status: Not on file  Other Topics Concern  . Not on file  Social History Narrative  . Not on file   Additional Social History:                         Sleep: Good  Appetite:  Good  Current Medications: Current Facility-Administered Medications  Medication Dose Route Frequency Provider Last Rate Last Dose  . acetaminophen (TYLENOL) tablet 650 mg  650 mg Oral Q6H PRN Laveda Abbe, NP   650 mg at 04/26/18 1537  . alum & mag hydroxide-simeth (MAALOX/MYLANTA) 200-200-20 MG/5ML suspension 30  mL  30 mL Oral Q4H PRN Laveda AbbeParks, Laurie Britton, NP      . carbamazepine (TEGRETOL) chewable tablet 100 mg  100 mg Oral TID Malvin JohnsFarah, Cambrea Kirt, MD   100 mg at 04/27/18 0811  . clonazePAM (KLONOPIN) tablet 0.25 mg  0.25 mg Oral BID Malvin JohnsFarah, Smriti Barkow, MD      . feeding supplement (ENSURE ENLIVE) (ENSURE ENLIVE) liquid 237 mL  237 mL Oral BID BM Charm RingsLord, Jamison Y, NP      . gabapentin (NEURONTIN) capsule 300 mg  300 mg Oral TID Malvin JohnsFarah, Saydie Gerdts, MD   300 mg at 04/27/18 [id number]  . hydrOXYzine (ATARAX/VISTARIL) tablet 25 mg  25 mg Oral TID PRN Laveda AbbeParks, Laurie Britton, NP   25 mg at 04/27/18 [id number]  . magnesium hydroxide (MILK OF MAGNESIA) suspension 30 mL  30 mL Oral Daily PRN Laveda AbbeParks, Laurie Britton, NP      . nicotine (NICODERM CQ - dosed in mg/24 hours) patch 21 mg  21 mg Transdermal Daily Charm RingsLord, Jamison Y, NP   21 mg at 04/27/18 0811  . traZODone (DESYREL) tablet 50 mg  50 mg Oral QHS PRN  Laveda AbbeParks, Laurie Britton, NP   50 mg at 04/26/18 2103  . vortioxetine HBr (TRINTELLIX) tablet 10 mg  10 mg Oral Daily Malvin JohnsFarah, Dollye Glasser, MD        Lab Results: No results found for this or any previous visit (from the past 48 hour(s)).  Blood Alcohol level:  Lab Results  Component Value Date   ETH <10 04/24/2018   ETH <5 05/16/2014    Metabolic Disorder Labs: No results found for: HGBA1C, MPG No results found for: PROLACTIN No results found for: CHOL, TRIG, HDL, CHOLHDL, VLDL, LDLCALC  Physical Findings: AIMS: Facial and Oral Movements Muscles of Facial Expression: None, normal Lips and Perioral Area: None, normal Jaw: None, normal Tongue: None, normal,Extremity Movements Upper (arms, wrists, hands, fingers): None, normal Lower (legs, knees, ankles, toes): None, normal, Trunk Movements Neck, shoulders, hips: None, normal, Overall Severity Severity of abnormal movements (highest score from questions above): None, normal Incapacitation due to abnormal movements: None, normal Patient's awareness of abnormal movements (rate only patient's report): No Awareness, Dental Status Current problems with teeth and/or dentures?: No Does patient usually wear dentures?: No  CIWA:  CIWA-Ar Total: 4 COWS:  COWS Total Score: 4  Musculoskeletal: Strength & Muscle Tone: within normal limits Gait & Station: normal Patient leans: N/A  Psychiatric Specialty Exam: Physical Exam  ROS  Blood pressure 103/79, pulse 71, temperature 97.9 F (36.6 C), temperature source Oral, resp. rate 18, height 5\' 9"  (1.753 m), weight 66.7 kg, SpO2 99 %.Body mass index is 21.71 kg/m.  General Appearance: Casual  Eye Contact:  Good  Speech:  Clear and Coherent  Volume:  Normal  Mood:  Dysphoric  Affect:  Congruent  Thought Process:  Linear  Orientation:  Full (Time, Place, and Person)  Thought Content:  Logical  Suicidal Thoughts:  No  Homicidal Thoughts:  No  Memory:  Immediate;   Fair  Judgement:  Fair   Insight:  Good  Psychomotor Activity:  Normal  Concentration:  Concentration: Good  Recall:  Fair  Fund of Knowledge:  Fair  Language:  Fair  Akathisia:  Negative  Handed:  Right  AIMS (if indicated):     Assets:  Physical Health Resilience Social Support  ADL's:  Intact  Cognition:  WNL  Sleep:  Number of Hours: 6.75     Treatment Plan Summary: Daily contact with patient  to assess and evaluate symptoms and progress in treatment, Medication management and Plan Patient seen she continues to do pretty well as far as withdrawal symptoms reporting she wants another antidepressant she does not have thoughts of harming self and probable discharge tomorrow as planned continue groups and individual therapy  Malvin Johns, MD 04/27/2018, 9:52 AM

## 2018-04-27 NOTE — Progress Notes (Signed)
D: Pt alert and oriented. Pt rates depression 8/10, hopelessness 8/10, and anxiety 8/10.Pt goal: find out what medicine for depression and where I'm going. Pt reports that she is going to accomplish her goal by talking to someone. Pt reports energy leave as low and concentration as being poor and appetite as being good. Pt reports sleep last night as being poor. Pt did not receive medications for sleep. Pt denies experiencing pain. Pt reports experiencing any SI. Pt contracts verbally for safety. Pt denies experiencing any HI or AVH at this time.   Pt has some new medication order. Medication have been explained and pt is satisfied with explanation.   A: Scheduled medications administered to pt, per MD orders. Support and encouragement provided. Frequent verbal contact made. Routine safety checks conducted q15 minutes.   R: No adverse drug reactions noted. Pt verbally contracts for safety at this time. Pt complaint with medications and treatment plan. Pt interacts well with others on the unit. Pt remains safe at this time. Will continue to monitor.

## 2018-04-28 MED ORDER — HYDROXYZINE HCL 50 MG PO TABS
25.0000 mg | ORAL_TABLET | Freq: Three times a day (TID) | ORAL | Status: DC | PRN
  Filled 2018-04-28: qty 25

## 2018-04-28 MED ORDER — CARBAMAZEPINE 200 MG PO TABS
100.0000 mg | ORAL_TABLET | Freq: Three times a day (TID) | ORAL | Status: DC
  Filled 2018-04-28: qty 45

## 2018-04-28 MED ORDER — VORTIOXETINE HBR 20 MG PO TABS
10.0000 mg | ORAL_TABLET | Freq: Every day | ORAL | Status: DC
  Filled 2018-04-28 (×2): qty 15

## 2018-04-28 MED ORDER — GABAPENTIN 600 MG PO TABS
300.0000 mg | ORAL_TABLET | Freq: Three times a day (TID) | ORAL | Status: DC
  Filled 2018-04-28: qty 45

## 2018-04-28 MED ORDER — TRAZODONE HCL 100 MG PO TABS
50.0000 mg | ORAL_TABLET | Freq: Every evening | ORAL | Status: DC | PRN
  Filled 2018-04-28: qty 15

## 2018-04-28 NOTE — Plan of Care (Signed)
Problem: Safety: Goal: Periods of time without injury will increase Outcome: Progressing   Problem: Health Behavior/Discharge Planning: Goal: Ability to identify changes in lifestyle to reduce recurrence of condition will improve Outcome: Progressing DAR Note: Pt presents guarded with flat, guarded, irritable and fidgety/restless on interactions. Observed asleep at long interval during this shift. Reports she slept well last night with good appetite, low energy and poor concentration level. Rates her depression, anxiety and hopelessness all 9/10. Pt's goal for today is "treatment". Observed on phone with treatment center this morning and was accepted into [facility] treatment center.  Encouragement, support and reassurance provided to pt as needed. Scheduled and PRN medications given per provider's order with verbal education and effects monitored. Safety checks maintained at Q 15 minutes intervals. Pt tolerates all PO intake well. Compliant with medications. Denies concerns or side effects. Pt did not attend groups despite multiple prompts. Remains safe on unit.

## 2018-04-28 NOTE — Progress Notes (Signed)
Pt has decided to accept bed at [facility] for tomorrow. Admission at 3pm. Pt must discharge between 10am-11am on Friday, 2/21--her boyfriend [name] will pick her up and transport to facility. CSW contacted Joni Reining in admissions to confirm above plan. Pt gave CSW permission to email her HR Rep, Mekelle regarding this plan. E-mail copy provided to pt for her records.  Myndi Wamble S. Alan Ripper, MSW, LCSW Clinical Social Worker 04/28/2018 1:26 PM

## 2018-04-28 NOTE — [facility] Group Notes (Signed)
Date/Time 04/28/2018 1:15 PM  Type of Therapy/Topic:  Group Therapy:  Feelings about Diagnosis  Participation Level:  Did not attend  Description of Group:    This group will allow patients to explore their thoughts and feelings about diagnoses they have received. Patients will be guided to explore their level of understanding and acceptance of these diagnoses. Facilitator will encourage patients to process their thoughts and feelings about the reactions of others to their diagnosis, and will guide patients in identifying ways to discuss their diagnosis with significant others in their lives. This group will be process-oriented, with patients participating in exploration of their own experiences as well as giving and receiving support and challenge from other group members.   Therapeutic Goals: 1. Patient will demonstrate understanding of diagnosis as evidence by identifying two or more symptoms of the disorder:  2. Patient will be able to express two feelings regarding the diagnosis 3. Patient will demonstrate ability to communicate their needs through discussion and/or role plays  Marian Sorrow, MSW Intern 04/28/2018 3:52

## 2018-04-28 NOTE — Progress Notes (Signed)
[facility] MD Progress Note  04/28/2018 9:03 AM Gail Castro  MRN:  [id number] Subjective:    Patient in bed minimal withdrawal symptoms seems to be improving in mood and affect but some degree of cravings with prolonged interview.  Hoping to be able to be excused from work in order to [facility] program we will know by 1030 or so today   Principal Problem: Polysubstance dependence, benzodiazepines and cocaine most worrisome, depressive disorder Diagnosis: Active Problems:   Major depressive disorder, recurrent, severe without psychotic features (HCC)  Total Time spent with patient: 20 minutes  Past Medical History:  Past Medical History:  Diagnosis Date  . Anxiety   . Bipolar 1 disorder (HCC)   . Cocaine use   . ETOH abuse   . Hepatitis C   . Heroin abuse (HCC)   . MRSA (methicillin resistant Staphylococcus aureus)   . Suicide and self-inflicted injury (HCC)   . Suicide ideation   . Tobacco abuse     Past Surgical History:  Procedure Laterality Date  . arm    . CHOLECYSTECTOMY    . EYE SURGERY    . TUBAL LIGATION     Family History: History reviewed. No pertinent family history.  Social History:  Social History   Substance and Sexual Activity  Alcohol Use Yes     Social History   Substance and Sexual Activity  Drug Use Yes  . Types: Cocaine, Marijuana    Social History   Socioeconomic History  . Marital status: Divorced    Spouse name: Not on file  . Number of children: Not on file  . Years of education: Not on file  . Highest education level: Not on file  Occupational History  . Not on file  Social Needs  . Financial resource strain: Not on file  . Food insecurity:    Worry: Not on file    Inability: Not on file  . Transportation needs:    Medical: Not on file    Non-medical: Not on file  Tobacco Use  . Smoking status: Current Every Day Smoker    Packs/day: 1.00    Types: Cigarettes  . Smokeless tobacco: Never  Used  Substance and Sexual Activity  . Alcohol use: Yes  . Drug use: Yes    Types: Cocaine, Marijuana  . Sexual activity: Yes    Birth control/protection: Surgical  Lifestyle  . Physical activity:    Days per week: Not on file    Minutes per session: Not on file  . Stress: Not on file  Relationships  . Social connections:    Talks on phone: Not on file    Gets together: Not on file    Attends religious service: Not on file    Active member of club or organization: Not on file    Attends meetings of clubs or organizations: Not on file    Relationship status: Not on file  Other Topics Concern  . Not on file  Social History Narrative  . Not on file   Additional Social History:                         Sleep: Fair  Appetite:  Fair  Current Medications: Current Facility-Administered Medications  Medication Dose Route Frequency Provider Last Rate Last Dose  . acetaminophen (TYLENOL) tablet 650 mg  650 mg Oral Q6H PRN Laveda AbbeParks, Laurie Britton, NP   650 mg at 04/26/18 1537  .  alum & mag hydroxide-simeth (MAALOX/MYLANTA) 200-200-20 MG/5ML suspension 30 mL  30 mL Oral Q4H PRN Laveda Abbe, NP      . carbamazepine (TEGRETOL) chewable tablet 100 mg  100 mg Oral TID Malvin Johns, MD   100 mg at 04/28/18 0806  . feeding supplement (ENSURE ENLIVE) (ENSURE ENLIVE) liquid 237 mL  237 mL Oral BID BM Charm Rings, NP      . gabapentin (NEURONTIN) capsule 300 mg  300 mg Oral TID Malvin Johns, MD   300 mg at 04/28/18 3013  . hydrOXYzine (ATARAX/VISTARIL) tablet 25 mg  25 mg Oral TID PRN Laveda Abbe, NP   25 mg at 04/28/18 0805  . magnesium hydroxide (MILK OF MAGNESIA) suspension 30 mL  30 mL Oral Daily PRN Laveda Abbe, NP      . nicotine (NICODERM CQ - dosed in mg/24 hours) patch 21 mg  21 mg Transdermal Daily Charm Rings, NP   21 mg at 04/28/18 0806  . traZODone (DESYREL) tablet 50 mg  50 mg Oral QHS PRN Laveda Abbe, NP   50 mg at 04/27/18  2200  . vortioxetine HBr (TRINTELLIX) tablet 10 mg  10 mg Oral Daily Malvin Johns, MD   10 mg at 04/28/18 0805    Lab Results: No results found for this or any previous visit (from the past 48 hour(s)).  Blood Alcohol level:  Lab Results  Component Value Date   ETH <10 04/24/2018   ETH <5 05/16/2014    Metabolic Disorder Labs: No results found for: HGBA1C, MPG No results found for: PROLACTIN No results found for: CHOL, TRIG, HDL, CHOLHDL, VLDL, LDLCALC  Physical Findings: AIMS: Facial and Oral Movements Muscles of Facial Expression: None, normal Lips and Perioral Area: None, normal Jaw: None, normal Tongue: None, normal,Extremity Movements Upper (arms, wrists, hands, fingers): None, normal Lower (legs, knees, ankles, toes): None, normal, Trunk Movements Neck, shoulders, hips: None, normal, Overall Severity Severity of abnormal movements (highest score from questions above): None, normal Incapacitation due to abnormal movements: None, normal Patient's awareness of abnormal movements (rate only patient's report): No Awareness, Dental Status Current problems with teeth and/or dentures?: No Does patient usually wear dentures?: No  CIWA:  CIWA-Ar Total: 2 COWS:  COWS Total Score: 2  Musculoskeletal: Strength & Muscle Tone: within normal limits Gait & Station: normal Patient leans: N/A  Psychiatric Specialty Exam: Physical Exam  ROS  Blood pressure 114/85, pulse 83, temperature 98.4 F (36.9 C), temperature source Oral, resp. rate 18, height 5\' 9"  (1.753 m), weight 66.7 kg, SpO2 99 %.Body mass index is 21.71 kg/m.  General Appearance: Casual  Eye Contact:  Fair  Speech:  Slow  Volume:  Decreased  Mood:  Dysphoric  Affect:  Congruent  Thought Process:  Goal Directed  Orientation:  Full (Time, Place, and Person)  Thought Content:  Logical  Suicidal Thoughts:  No  Homicidal Thoughts:  No  Memory:  Immediate;   Fair  Judgement:  Fair  Insight:  Fair  Psychomotor  Activity:  Normal  Concentration:  Concentration: Fair  Recall:  [employer] of Knowledge:  Fair  Language:  Fair  Akathisia:  Negative  Handed:  Right  AIMS (if indicated):     Assets:  Leisure Time Physical Health  ADL's:  Intact  Cognition:  WNL  Sleep:  Number of Hours: 6.75     Treatment Plan Summary: Daily contact with patient to assess and evaluate symptoms and progress in treatment,  Medication management and Plan No change in detox regimen or orders await word on rehab options  Starlee Corralejo, MD 04/28/2018, 9:04 AM

## 2018-04-28 NOTE — Tx Team (Signed)
Initial Treatment Plan 04/28/2018 10:35 PM Gail Castro YOM:[id number]    PATIENT STRESSORS: Marital or family conflict Medication change or noncompliance Substance abuse   PATIENT STRENGTHS: Capable of independent living General fund of knowledge Motivation for treatment/growth   PATIENT IDENTIFIED PROBLEMS: "help with depression"  "help with addiction"                   DISCHARGE CRITERIA:  Improved stabilization in mood, thinking, and/or behavior Verbal commitment to aftercare and medication compliance Withdrawal symptoms are absent or subacute and managed without 24-hour nursing intervention  PRELIMINARY DISCHARGE PLAN: Attend aftercare/continuing care group Attend 12-step recovery group Return to previous living arrangement  PATIENT/FAMILY INVOLVEMENT: This treatment plan has been presented to and reviewed with the patient, Gail Castro, and/or family member, .  The patient and family have been given the opportunity to ask questions and make suggestions.  Andrena Mews, RN 04/28/2018, 10:35 PM

## 2018-04-28 NOTE — Progress Notes (Signed)
Pt  Said her day was a 6,. She had a lot of phone calls one good thing that happened today and she had a visitor.

## 2018-04-28 NOTE — Progress Notes (Signed)
Pt has [employer] phone interview at 9:00AM.   Ethel Rana. Alan Ripper, MSW, LCSW Clinical Social Worker 04/28/2018 8:28 AM

## 2018-04-28 NOTE — Progress Notes (Signed)
Per patient request, email sent to [facility] (HR at her work) informing her of plan to discharge to [facility] for 21-28 days with admission date scheduled for Friday, 2/21 at 3:00PM. Copy of email provided to pt for her records.  Tabitha Riggins S. Alan Ripper, MSW, LCSW Clinical Social Worker 04/28/2018 2:59 PM

## 2018-04-28 NOTE — Progress Notes (Signed)
  [facility] Adult Case Management Discharge Plan :  Will you be returning to the same living situation after discharge:  No. Pt is entering [employer] program tomorrow, 2/21 at 3pm.  At discharge, do you have transportation home?: Yes,  pt's boyfriend will pick her up between 10am-11am on Friday, 04/29/2018 and will transport her directly to the facility.  Do you have the ability to pay for your medications: Yes,  [employer]  Release of information consent forms completed and submitted to medical records by CSW.   Patient to Follow up at: Follow-up Information    [facility] Follow up.   Specialty:  Behavioral Health Why:  Please have your care coordinator/social worker at [facility] contact the outpatient clinic to schedule follow-up for medication management and therapy prior to your discharge from the facility.  Contact information: [address] [place] [address] [phone]       [facility] Follow up on 04/29/2018.   Why:  You have been accepted for admission to this facility for Friday, 2/21 at 3:00PM. Please bring 30 day supply of all prescribed medications and clothing. Thank you. Contact information: [address] [place] [address] [phone]           Next level of care provider has access to [facility] Link:no  Safety Planning and Suicide Prevention discussed: Yes,  SPE completed with pt and her boyfriend. SPI pamphlet and mobile crisis information provided.   Have you used any form of tobacco in the last 30 days? (Cigarettes, Smokeless Tobacco, Cigars, and/or Pipes): Yes  Has patient been referred to the Quitline?: Patient refused referral  Patient has been referred for addiction treatment: Yes  [name], LCSW 04/28/2018, 3:11 PM

## 2018-04-29 DIAGNOSIS — F1920 Other psychoactive substance dependence, uncomplicated: Secondary | ICD-10-CM

## 2018-04-29 MED ORDER — VORTIOXETINE HBR 10 MG PO TABS: 10.0000 mg | ORAL_TABLET | Freq: Every day | ORAL | 2 refills | Status: DC

## 2018-04-29 MED ORDER — NICOTINE 14 MG/24HR TD PT24: 14.0000 mg | MEDICATED_PATCH | Freq: Every day | TRANSDERMAL | 0 refills | Status: AC

## 2018-04-29 MED ORDER — GABAPENTIN 300 MG PO CAPS: 300.0000 mg | ORAL_CAPSULE | Freq: Three times a day (TID) | ORAL | 2 refills | Status: DC

## 2018-04-29 MED ORDER — CARBAMAZEPINE 100 MG PO CHEW: 100.0000 mg | CHEWABLE_TABLET | Freq: Three times a day (TID) | ORAL | 0 refills | Status: DC

## 2018-04-29 MED ORDER — HYDROXYZINE HCL 25 MG PO TABS: 25.0000 mg | ORAL_TABLET | Freq: Three times a day (TID) | ORAL | 0 refills | Status: DC | PRN

## 2018-04-29 NOTE — Plan of Care (Signed)
  Problem: Education: Goal: Knowledge of [place] [facility] Education information/materials will improve Outcome: Adequate for Discharge Goal: Emotional status will improve Outcome: Adequate for Discharge Goal: Mental status will improve Outcome: Adequate for Discharge Goal: Verbalization of understanding the information provided will improve Outcome: Adequate for Discharge   Problem: Activity: Goal: Interest or engagement in activities will improve Outcome: Adequate for Discharge Goal: Sleeping patterns will improve Outcome: Adequate for Discharge   Problem: Coping: Goal: Ability to verbalize frustrations and anger appropriately will improve Outcome: Adequate for Discharge Goal: Ability to demonstrate self-control will improve Outcome: Adequate for Discharge   Problem: Health Behavior/Discharge Planning: Goal: Identification of resources available to assist in meeting health care needs will improve Outcome: Adequate for Discharge Goal: Compliance with treatment plan for underlying cause of condition will improve Outcome: Adequate for Discharge   Problem: Physical Regulation: Goal: Ability to maintain clinical measurements within normal limits will improve Outcome: Adequate for Discharge   Problem: Safety: Goal: Periods of time without injury will increase Outcome: Adequate for Discharge   Problem: Education: Goal: Knowledge of disease or condition will improve Outcome: Adequate for Discharge Goal: Understanding of discharge needs will improve Outcome: Adequate for Discharge   Problem: Health Behavior/Discharge Planning: Goal: Ability to identify changes in lifestyle to reduce recurrence of condition will improve Outcome: Adequate for Discharge Goal: Identification of resources available to assist in meeting health care needs will improve Outcome: Adequate for Discharge   Problem: Physical Regulation: Goal: Complications related to the disease process, condition or  treatment will be avoided or minimized Outcome: Adequate for Discharge   Problem: Safety: Goal: Ability to remain free from injury will improve Outcome: Adequate for Discharge   Problem: Education: Goal: Utilization of techniques to improve thought processes will improve Outcome: Adequate for Discharge Goal: Knowledge of the prescribed therapeutic regimen will improve Outcome: Adequate for Discharge   Problem: Activity: Goal: Interest or engagement in leisure activities will improve Outcome: Adequate for Discharge Goal: Imbalance in normal sleep/wake cycle will improve Outcome: Adequate for Discharge   Problem: Coping: Goal: Coping ability will improve Outcome: Adequate for Discharge Goal: Will verbalize feelings Outcome: Adequate for Discharge   Problem: Health Behavior/Discharge Planning: Goal: Ability to make decisions will improve Outcome: Adequate for Discharge Goal: Compliance with therapeutic regimen will improve Outcome: Adequate for Discharge   Problem: Role Relationship: Goal: Will demonstrate positive changes in social behaviors and relationships Outcome: Adequate for Discharge   Problem: Safety: Goal: Ability to disclose and discuss suicidal ideas will improve Outcome: Adequate for Discharge Goal: Ability to identify and utilize support systems that promote safety will improve Outcome: Adequate for Discharge   Problem: Self-Concept: Goal: Will verbalize positive feelings about self Outcome: Adequate for Discharge Goal: Level of anxiety will decrease Outcome: Adequate for Discharge   Problem: Education: Goal: Ability to make informed decisions regarding treatment will improve Outcome: Adequate for Discharge   Problem: Coping: Goal: Coping ability will improve Outcome: Adequate for Discharge   Problem: Health Behavior/Discharge Planning: Goal: Identification of resources available to assist in meeting health care needs will improve Outcome: Adequate  for Discharge   Problem: Medication: Goal: Compliance with prescribed medication regimen will improve Outcome: Adequate for Discharge   Problem: Self-Concept: Goal: Ability to disclose and discuss suicidal ideas will improve Outcome: Adequate for Discharge Goal: Will verbalize positive feelings about self Outcome: Adequate for Discharge

## 2018-04-29 NOTE — Progress Notes (Signed)
Adult Psychoeducational Group Note  Date:  04/29/2018 Time:  9:39 AM  Group Topic/Focus:  Goals Group:   The focus of this group is to help patients establish daily goals to achieve during treatment and discuss how the patient can incorporate goal setting into their daily lives to aide in recovery.  Participation Level:  Did Not Attend  Participation Quality:  Did not attend  Affect:  Did not attend  Cognitive:  Did not attend  Insight: None  Engagement in Group:  Did not attend  Modes of Intervention:  Did not attend  Additional Comments:  Patient did not attend goals group this morning. Patient was notified 15 minutes prior.  Davielle Lingelbach L Nabiha Planck 04/29/2018, 9:39 AM

## 2018-04-29 NOTE — Progress Notes (Signed)
Discharge note: Patient reviewed discharge paperwork with RN including prescriptions, follow up appointments, and lab work. Patient given the opportunity to ask questions. All concerns were addressed. All belongings were returned to patient. Denied SI/HI/AVH. Patient thanked staff for their care while at the hospital.  Patient was discharged to lobby where her boyfriend was waiting to escort her to [facility].

## 2018-04-29 NOTE — Tx Team (Signed)
Interdisciplinary Treatment and Diagnostic Plan Update  04/29/2018 Time of Session: 0830AM [place] [facility] MRN: [id number]  Principal Diagnosis: MDD, recurrent, severe, without psychotic features  Secondary Diagnoses: Active Problems:   Major depressive disorder, recurrent, severe without psychotic features (HCC)   Current Medications:  Current Facility-Administered Medications  Medication Dose Route Frequency Provider Last Rate Last Dose  . acetaminophen (TYLENOL) tablet 650 mg  650 mg Oral Q6H PRN Laveda Abbe, NP   650 mg at 04/26/18 1537  . alum & mag hydroxide-simeth (MAALOX/MYLANTA) 200-200-20 MG/5ML suspension 30 mL  30 mL Oral Q4H PRN Laveda Abbe, NP   30 mL at 04/28/18 2110  . carbamazepine (TEGRETOL) chewable tablet 100 mg  100 mg Oral TID Malvin Johns, MD   100 mg at 04/29/18 0759  . feeding supplement (ENSURE ENLIVE) (ENSURE ENLIVE) liquid 237 mL  237 mL Oral BID BM Charm Rings, NP      . gabapentin (NEURONTIN) capsule 300 mg  300 mg Oral TID Malvin Johns, MD   300 mg at 04/29/18 0759  . hydrOXYzine (ATARAX/VISTARIL) tablet 25 mg  25 mg Oral TID PRN Laveda Abbe, NP   25 mg at 04/29/18 0801  . magnesium hydroxide (MILK OF MAGNESIA) suspension 30 mL  30 mL Oral Daily PRN Laveda Abbe, NP      . nicotine (NICODERM CQ - dosed in mg/24 hours) patch 21 mg  21 mg Transdermal Daily Charm Rings, NP   21 mg at 04/29/18 0758  . traZODone (DESYREL) tablet 50 mg  50 mg Oral QHS PRN Laveda Abbe, NP   50 mg at 04/28/18 2110  . vortioxetine HBr (TRINTELLIX) tablet 10 mg  10 mg Oral Daily Malvin Johns, MD   10 mg at 04/29/18 9937   PTA Medications: Medications Prior to Admission  Medication Sig Dispense Refill Last Dose  . ibuprofen (ADVIL,MOTRIN) 200 MG tablet Take 400-800 mg by mouth every 6 (six) hours as needed for moderate pain.   04/23/2018 at Unknown time    Patient Stressors: Marital or family conflict Medication change or  noncompliance Substance abuse  Patient Strengths: Capable of independent living General fund of knowledge Motivation for treatment/growth  Treatment Modalities: Medication Management, Group therapy, Case management,  1 to 1 session with clinician, Psychoeducation, Recreational therapy.   Physician Treatment Plan for Primary Diagnosis: MDD, recurrent, severe, without psychotic features Long Term Goal(s): Improvement in symptoms so as ready for discharge Improvement in symptoms so as ready for discharge   Short Term Goals: Ability to demonstrate self-control will improve Ability to identify and develop effective coping behaviors will improve Ability to demonstrate self-control will improve Ability to identify and develop effective coping behaviors will improve  Medication Management: Evaluate patient's response, side effects, and tolerance of medication regimen.  Therapeutic Interventions: 1 to 1 sessions, Unit Group sessions and Medication administration.  Evaluation of Outcomes: Adequate for discharge   Physician Treatment Plan for Secondary Diagnosis: Active Problems:   Major depressive disorder, recurrent, severe without psychotic features (HCC)  Long Term Goal(s): Improvement in symptoms so as ready for discharge Improvement in symptoms so as ready for discharge   Short Term Goals: Ability to demonstrate self-control will improve Ability to identify and develop effective coping behaviors will improve Ability to demonstrate self-control will improve Ability to identify and develop effective coping behaviors will improve     Medication Management: Evaluate patient's response, side effects, and tolerance of medication regimen.  Therapeutic Interventions: 1 to  1 sessions, Unit Group sessions and Medication administration.  Evaluation of Outcomes: Adequate for discharge   RN Treatment Plan for Primary Diagnosis: MDD, recurrent, severe, without psychotic features Long Term  Goal(s): Knowledge of disease and therapeutic regimen to maintain health will improve  Short Term Goals: Ability to remain free from injury will improve, Ability to verbalize frustration and anger appropriately will improve, Ability to verbalize feelings will improve and Ability to disclose and discuss suicidal ideas  Medication Management: RN will administer medications as ordered by provider, will assess and evaluate patient's response and provide education to patient for prescribed medication. RN will report any adverse and/or side effects to prescribing provider.  Therapeutic Interventions: 1 on 1 counseling sessions, Psychoeducation, Medication administration, Evaluate responses to treatment, Monitor vital signs and CBGs as ordered, Perform/monitor CIWA, COWS, AIMS and Fall Risk screenings as ordered, Perform wound care treatments as ordered.  Evaluation of Outcomes: Adequate for discharge   LCSW Treatment Plan for Primary Diagnosis: MDD, recurrent, severe, without psychotic features Long Term Goal(s): Safe transition to appropriate next level of care at discharge, Engage patient in therapeutic group addressing interpersonal concerns.  Short Term Goals: Engage patient in aftercare planning with referrals and resources, Facilitate patient progression through stages of change regarding substance use diagnoses and concerns and Identify triggers associated with mental health/substance abuse issues  Therapeutic Interventions: Assess for all discharge needs, 1 to 1 time with Social worker, Explore available resources and support systems, Assess for adequacy in community support network, Educate family and significant other(s) on suicide prevention, Complete Psychosocial Assessment, Interpersonal group therapy.  Evaluation of Outcomes: Adequate for discharge  Progress in Treatment: Attending groups: Intermittently Participating in groups: Yes, when she attends.  Taking medication as prescribed:  Yes. Toleration medication: Yes. Family/Significant other contact made: SPE completed with pt's boyfriend.  Patient understands diagnosis: Yes. Discussing patient identified problems/goals with staff: Yes. Medical problems stabilized or resolved: Yes. Denies suicidal/homicidal ideation: Yes. Issues/concerns per patient self-inventory: No. Other: n/a   New problem(s) identified: No, Describe:  n/a  New Short Term/Long Term Goal(s): detox, medication management for mood stabilization; elimination of SI thoughts; development of comprehensive mental wellness/sobriety plan.   Patient Goals:  "To work on not feeling suicidal and getting through this relapse. Staying clean."   Discharge Plan or Barriers: Pt has been accepted to [facility] for today at 3pm. Her boyfriend will pick her up between 10am-11am. MHAG pamphlet, Mobile Crisis information, and AA/NA information provided to patient for additional community support and resources.   Reason for Continuation of Hospitalization: none  Estimated Length of Stay: today, 04/29/2018  Attendees: Patient:   04/29/2018 8:57 AM  Physician: Dr. Jeannine Kitten MD 04/29/2018 8:57 AM  Nursing: Arlyss Repress RN 04/29/2018 8:57 AM  RN Care Manager:x 04/29/2018 8:57 AM  Social Worker: Corrie Mckusick LCSW 04/29/2018 8:57 AM  Recreational Therapist: x 04/29/2018 8:57 AM  Other: Armandina Stammer NP; Marciano Sequin NP 04/29/2018 8:57 AM  Other:  04/29/2018 8:57 AM  Other: 04/29/2018 8:57 AM    Scribe for Treatment Team: [name], LCSW 04/29/2018 8:57 AM

## 2018-04-29 NOTE — Discharge Summary (Signed)
Physician Discharge Summary Note  Patient:  Gail Castro is an 38 y.o., female  MRN:  [id number]  DOB:  [birth year]  Patient phone:  [phone] (home)   Patient address:   [address] [place] [address],  Total Time spent with patient: Greater than 30 minutes  Date of Admission:  04/24/2018  Date of Discharge: 04-29-18  Reason for Admission: Was found by her boyfriend with superficial wrist lacerations and holding a gun to her head, she had become suicidal, frustrated at a relapse on cocaine, Xanax and alcohol.   Principal Problem: Major depressive disorder, recurrent, severe without psychotic features [facility])  Discharge Diagnoses: Principal Problem:   Major depressive disorder, recurrent, severe without psychotic features (HCC) Active Problems:   Polysubstance (excluding opioids) dependence with physiological dependence [facility])  Past Psychiatric History: Major depressive disorder.  Past Medical History:  Past Medical History:  Diagnosis Date  . Anxiety   . Bipolar 1 disorder (HCC)   . Cocaine use   . ETOH abuse   . Hepatitis C   . Heroin abuse (HCC)   . MRSA (methicillin resistant Staphylococcus aureus)   . Suicide and self-inflicted injury (HCC)   . Suicide ideation   . Tobacco abuse     Past Surgical History:  Procedure Laterality Date  . arm    . CHOLECYSTECTOMY    . EYE SURGERY    . TUBAL LIGATION     Family History: History reviewed. No pertinent family history.  Family Psychiatric  History: See H&P.  Social History:  Social History   Substance and Sexual Activity  Alcohol Use Yes     Social History   Substance and Sexual Activity  Drug Use Yes  . Types: Cocaine, Marijuana    Social History   Socioeconomic History  . Marital status: Divorced    Spouse name: Not on file  . Number of children: Not on file  . Years of education: Not on file  . Highest education level: Not on file  Occupational History  . Not on file  Social Needs  .  Financial resource strain: Not on file  . Food insecurity:    Worry: Not on file    Inability: Not on file  . Transportation needs:    Medical: Not on file    Non-medical: Not on file  Tobacco Use  . Smoking status: Current Every Day Smoker    Packs/day: 1.00    Types: Cigarettes  . Smokeless tobacco: Never Used  Substance and Sexual Activity  . Alcohol use: Yes  . Drug use: Yes    Types: Cocaine, Marijuana  . Sexual activity: Yes    Birth control/protection: Surgical  Lifestyle  . Physical activity:    Days per week: Not on file    Minutes per session: Not on file  . Stress: Not on file  Relationships  . Social connections:    Talks on phone: Not on file    Gets together: Not on file    Attends religious service: Not on file    Active member of club or organization: Not on file    Attends meetings of clubs or organizations: Not on file    Relationship status: Not on file  Other Topics Concern  . Not on file  Social History Narrative  . Not on file   Hospital Course: (Per Md's admission evaluation): [name] is 38 years of age and she presented after being found by her boyfriend with wrist lacerations/superficial, and holding  a gun to her head, she had become suicidal, frustrated at a relapse on cocaine, Xanax and alcohol. On presentation she was sluggish and groggy her alcohol level was negligible but drug screen showed benzodiazepines and cocaine.The patient reports a long history of chemical dependency issues states she was clean for 18 months after she had been treated, entering into a year-long program in the [place] area,.  She had sought further rehab at [facility] and again was clean 18 months after that period of time but states she relapsed 1 month ago.  She states she abuses cocaine, "shoots heroin" though it was not in her system, and has been drinking heavily intermittently however but most worrisome is her use of Xanax, she is buying Xanax off the street  taking at least 4 mg a day, sometimes will obtain the 2 mg strength so she is possibly taking up to 8 mg a day. States she is never had a seizure and has never had psychotic symptoms states she "never lets her self go that long" because after 3 days because she becomes antsy and relapses, so the longer she will go without any of these compounds is about 2-1/2 3 days. She is not suicidal now she can contract for safety while here. She is having some cramping and cravings.  After the above admission evaluation, Janilah was strated on the medication regimen for her presenting symptoms. She received, stabilized & was discharged on the medications as listed below on the discharge medication lists. She was enrolled & participated in the group counseling sessions being offered & held on this unit. She learned coping skills that should help her cope better after discharge to maintain mood stability & sobriety. She presented no other pre-existing medical issues that required treatment & or monitoring. She tolerated her treatment regimen without any adverse effects or reactions reported.   Dwan's symptoms responded to her treatment regimen. This is evidenced by her daily reports of improving symptoms, absence of drug cravings, suicidal ideations & hopeful about going into a rehabilitation treatment program after discharge from [facility]. She is currently mentally & medically stable to be discharged to continue mental health care, medication management & further substance abuse treatment as noted below. She is provided with all the necessary information needed to make this appointment without problems.  Today upon her discharge evaluation, Talayeh shares, "I'm doing good & feeling well" She denies any specific concerns. She is sleeping well. Her appetite is good. She denies any other physical complaints. She denies SI/HI/AH/VH, delusional thoughts or paranoia. She is tolerating her medications well. She is in agreement to  continue her current regimen without changes. She is in agreement to have follow up at the [facility] treatment center for further substance abuse treatment. She is able to engage in safety planning including plan to return to [facility] or contact emergency services if she feels unable to maintain her own safety or the safety of others. Pt had no further questions, comments, or concerns. She left [facility] with all personal belongs in no apparent distress. [facility] pharmacy provided her with all her pertinent discharge medications. Transportation per boyfriend.  Physical Findings: AIMS: Facial and Oral Movements Muscles of Facial Expression: None, normal Lips and Perioral Area: None, normal Jaw: None, normal Tongue: None, normal,Extremity Movements Upper (arms, wrists, hands, fingers): None, normal Lower (legs, knees, ankles, toes): None, normal, Trunk Movements Neck, shoulders, hips: None, normal, Overall Severity Severity of abnormal movements (highest score from questions above): None, normal Incapacitation due to  abnormal movements: None, normal Patient's awareness of abnormal movements (rate only patient's report): No Awareness, Dental Status Current problems with teeth and/or dentures?: No Does patient usually wear dentures?: No  CIWA:  CIWA-Ar Total: 2 COWS:  COWS Total Score: 2  Musculoskeletal: Strength & Muscle Tone: within normal limits Gait & Station: normal Patient leans: N/A  Psychiatric Specialty Exam: Physical Exam  Nursing note and vitals reviewed. Constitutional: She appears well-developed.  HENT:  Head: Normocephalic.  Neck: Normal range of motion.  Cardiovascular: Normal rate.  Respiratory: Effort normal.  GI: Soft.  Genitourinary:    Genitourinary Comments: Deferred   Musculoskeletal: Normal range of motion.  Neurological: She is alert.  Skin: Skin is warm.    Review of Systems  Constitutional: Negative.   HENT: Negative.   Eyes: Negative.   Respiratory: Negative.   Negative for cough and shortness of breath.   Cardiovascular: Negative.  Negative for chest pain and palpitations.  Gastrointestinal: Negative.  Negative for abdominal pain, heartburn, nausea and vomiting.  Genitourinary: Negative.   Musculoskeletal: Negative.   Skin: Negative.   Neurological: Negative.  Negative for dizziness and headaches.  Endo/Heme/Allergies: Negative.   Psychiatric/Behavioral: Positive for depression (Stabilized with medication prior to discharge) and substance abuse (Hx. Benzodiazepine & cocaine use disorders). Negative for hallucinations, memory loss and suicidal ideas. The patient has insomnia (Stabilized with medication prior to discharge). The patient is not nervous/anxious (Stable).     Blood pressure 125/71, pulse 74, temperature 98.4 F (36.9 C), temperature source Oral, resp. rate 16, height 5\' 9"  (1.753 m), weight 66.7 kg, SpO2 99 %.Body mass index is 21.71 kg/m.  General Appearance: Casual  Eye Contact:  Good  Speech:  Clear and Coherent and Normal Rate  Volume:  Normal  Mood:  Euthymic  Affect:  Appropriate and Congruent  Thought Process:  Coherent, Linear and Descriptions of Associations: Intact  Orientation:  Full (Time, Place, and Person)  Thought Content:  Logical  Suicidal Thoughts:  Denies any thoughts plans or intent.  Homicidal Thoughts:  Denies  Memory:  Immediate;   Good Recent;   Good Remote;   Good  Judgement:  Good  Insight:  Fair  Psychomotor Activity:  Normal  Concentration:  Concentration: Good and Attention Span: Good  Recall:  Good  Fund of Knowledge:  Good  Language:  Good  Akathisia:  Negative  Handed:  Right  AIMS (if indicated):     Assets:  Communication Skills Desire for Improvement Physical Health  ADL's:  Intact  Cognition:  WNL  Sleep:  Number of Hours: 6.75   Have you used any form of tobacco in the last 30 days? (Cigarettes, Smokeless Tobacco, Cigars, and/or Pipes): Yes  Has this patient used any form of  tobacco in the last 30 days? (Cigarettes, Smokeless Tobacco, Cigars, and/or Pipes): Yes, A prescription for an FDA-approved tobacco cessation medication was offered at discharge.  Blood Alcohol level:  Lab Results  Component Value Date   ETH <10 04/24/2018   ETH <5 05/16/2014   Metabolic Disorder Labs:  No results found for: HGBA1C, MPG No results found for: PROLACTIN No results found for: CHOL, TRIG, HDL, CHOLHDL, VLDL, LDLCALC  See Psychiatric Specialty Exam and Suicide Risk Assessment completed by Attending Physician prior to discharge.  Discharge destination:  Home  Is patient on multiple antipsychotic therapies at discharge:  No   Has Patient had three or more failed trials of antipsychotic monotherapy by history:  No  Recommended Plan for Multiple Antipsychotic Therapies:  NA  Allergies as of 04/29/2018   No Known Allergies     Medication List    STOP taking these medications   ibuprofen 200 MG tablet Commonly known as:  ADVIL,MOTRIN     TAKE these medications     Indication  carbamazepine 100 MG chewable tablet Commonly known as:  TEGRETOL Chew 1 tablet (100 mg total) by mouth 3 (three) times daily.  Indication:  Alcohol Withdrawal Syndrome, Manic-Depression   gabapentin 300 MG capsule Commonly known as:  NEURONTIN Take 1 capsule (300 mg total) by mouth 3 (three) times daily.  Indication:  Alcohol Withdrawal Syndrome   hydrOXYzine 25 MG tablet Commonly known as:  ATARAX/VISTARIL Take 1 tablet (25 mg total) by mouth 3 (three) times daily as needed for anxiety.  Indication:  Feeling Anxious   nicotine 14 mg/24hr patch Commonly known as:  NICODERM CQ - dosed in mg/24 hours Place 1 patch (14 mg total) onto the skin daily for 14 days.  Indication:  Nicotine Addiction   vortioxetine HBr 10 MG Tabs tablet Commonly known as:  TRINTELLIX Take 1 tablet (10 mg total) by mouth daily.  Indication:  Major Depressive Disorder      Follow-up Information     [facility] Follow up.   Specialty:  Behavioral Health Why:  Please have your care coordinator/social worker at [facility] contact the outpatient clinic to schedule follow-up for medication management and therapy prior to your discharge from the facility.  Contact information: [address] [place] [address] [phone]       [facility] Follow up on 04/29/2018.   Why:  You have been accepted for admission to this facility for Friday, 2/21 at 3:00PM. Please bring 30 day supply of all prescribed medications and clothing. Thank you. Contact information: [address] [place] [address] [phone]          Follow-up recommendations: Activity:  As tolerated Diet: As recommended by your primary care doctor. Keep all scheduled follow-up appointments as recommended.   Comments: Patient is instructed prior to discharge to: Take all medications as prescribed by his/her mental healthcare provider. Report any adverse effects and or reactions from the medicines to his/her outpatient provider promptly. Patient has been instructed & cautioned: To not engage in alcohol and or illegal drug use while on prescription medicines. In the event of worsening symptoms, patient is instructed to call the crisis hotline, 911 and or go to the nearest ED for appropriate evaluation and treatment of symptoms. To follow-up with his/her primary care provider for your other medical issues, concerns and or health care needs.   Signed: Armandina Stammer, NP, PMHNP, FNP-BC 04/29/2018, 1:14 PM

## 2018-04-29 NOTE — BHH Suicide Risk Assessment (Signed)
[facility] Discharge Suicide Risk Assessment   Principal Problem: Substance dependence/substance-induced mood disorder/depression Discharge Diagnoses: Active Problems:   Major depressive disorder, recurrent, severe without psychotic features (HCC)   Total Time spent with patient: 45 minutes  Currently is alert oriented cooperative eager for discharge planning to go to [facility] treatment center, no thoughts of harming self or others no cravings tremors or withdrawal no seizure activity or psychosis while here Mental Status Per Nursing Assessment::   On Admission:  Suicidal ideation indicated by patient, Suicidal ideation indicated by others, Suicide plan  Demographic Factors:  Caucasian  Loss Factors: NA  Historical Factors: NA  Risk Reduction Factors:   Religious beliefs about death  Continued Clinical Symptoms:  Alcohol/Substance Abuse/Dependencies  Cognitive Features That Contribute To Risk:  None    Suicide Risk:  Minimal: No identifiable suicidal ideation.  Patients presenting with no risk factors but with morbid ruminations; may be classified as minimal risk based on the severity of the depressive symptoms  Follow-up Information    [facility] Follow up.   Specialty:  Behavioral Health Why:  Please have your care coordinator/social worker at [facility] contact the outpatient clinic to schedule follow-up for medication management and therapy prior to your discharge from the facility.  Contact information: [address] [place] [address] [phone]       [facility] Follow up on 04/29/2018.   Why:  You have been accepted for admission to this facility for Friday, 2/21 at 3:00PM. Please bring 30 day supply of all prescribed medications and clothing. Thank you. Contact information: [address] [place] [address] [phone]           Plan Of  Care/Follow-up recommendations:  Activity:  full  Zanyah Lentsch, MD 04/29/2018, 7:40 AM

## 2018-05-27 ENCOUNTER — Ambulatory Visit: Payer: 59 | Admitting: Family Medicine

## 2018-10-08 ENCOUNTER — Emergency Department (HOSPITAL_COMMUNITY)
Admission: EM | Admit: 2018-10-08 | Discharge: 2018-10-08 | Disposition: A | Discharge status: Home / Self Care | Payer: 59 | Attending: Emergency Medicine | Admitting: Emergency Medicine

## 2018-10-08 ENCOUNTER — Encounter (HOSPITAL_COMMUNITY): Payer: Self-pay | Admitting: Emergency Medicine

## 2018-10-08 ENCOUNTER — Other Ambulatory Visit: Payer: Self-pay

## 2018-10-08 DIAGNOSIS — L0390 Cellulitis, unspecified: Secondary | ICD-10-CM

## 2018-10-08 DIAGNOSIS — M79604 Pain in right leg: Secondary | ICD-10-CM

## 2018-10-08 DIAGNOSIS — L03116 Cellulitis of left lower limb: Secondary | ICD-10-CM | POA: Insufficient documentation

## 2018-10-08 DIAGNOSIS — F17210 Nicotine dependence, cigarettes, uncomplicated: Secondary | ICD-10-CM | POA: Insufficient documentation

## 2018-10-08 MED ORDER — DOXYCYCLINE HYCLATE 100 MG PO CAPS: 100.0000 mg | ORAL_CAPSULE | Freq: Two times a day (BID) | ORAL | 0 refills | Status: AC

## 2018-10-08 NOTE — Discharge Instructions (Addendum)
You were given a prescription for antibiotics for a suspected skin infection to the right leg. Please take the antibiotic prescription fully.   Please follow up with your primary care provider within 5-7 days for re-evaluation of your symptoms. If you do not have a primary care provider, information for a healthcare clinic has been provided for you to make arrangements for follow up care. Please return to the emergency room immediately if you experience any new or worsening symptoms or any symptoms that indicate worsening infection such as fevers, increased redness/swelling/pain, warmth, or drainage from the affected area.   --------------------------------  IMPORTANT PATIENT INSTRUCTIONS:  Your ED provider has recommended an Outpatient Ultrasound.  Please call [phone] to schedule an appointment.  If your appointment is scheduled for a Saturday, Sunday or holiday, please go to the [facility] Emergency Department Registration Desk at least 15 minutes prior to your appointment time and tell them you are there for an ultrasound.    If your appointment is scheduled for a weekday (Monday-Friday), please go directly to the [facility] Radiology Department at least 15 minutes prior to your appointment time and tell them you are there for an ultrasound.  Please call [phone] with questions.

## 2018-10-08 NOTE — ED Triage Notes (Signed)
Pt reports she just got back from [facility] and has some redness to right lower leg with pain. Pt states it is very swollen, but no swelling noted. As we are talking, pt begins to add multiple complaints over the past several months which vary in complexity including feeling "my spleen is popping out and my veins are turning into big bruises."

## 2018-10-08 NOTE — ED Provider Notes (Signed)
[facility] EMERGENCY DEPARTMENT Provider Note   CSN: [id number] Arrival date & time: 10/08/18  1652    History   Chief Complaint Chief Complaint  Patient presents with  . Leg Pain    HPI Gail Castro is a 38 y.o. female.     HPI   Pt is a 38 y/o female with a h/o bipolar disorder, ETOH use, hep C, MRSA, who presents the emergency department today complaining of pain to the right lower extremity that has been present for the last 2 weeks.  States she later developed redness to the lower leg as well.  No significant calf pain. No pain or difficulty with ROM of the ankle.  No chest pain or shortness of breath.  No risk factors for PE.  She is not had fevers at home. She reports h/o IVDU but states she has not used recently. Denies ever injecting into the RLE.   Past Medical History:  Diagnosis Date  . Anxiety   . Bipolar 1 disorder (HCC)   . Cocaine use   . ETOH abuse   . Hepatitis C   . Heroin abuse (HCC)   . MRSA (methicillin resistant Staphylococcus aureus)   . Suicide and self-inflicted injury (HCC)   . Suicide ideation   . Tobacco abuse     Patient Active Problem List   Diagnosis Date Noted  . 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 Major depressive disorder, recurrent, severe without psychotic features (HCC) 04/24/2018  . Cellulitis 09/07/2012  . Heroin abuse (HCC) 09/07/2012  . UTI (urinary tract infection) 09/07/2012  . Anxiety   . Bipolar 1 disorder (HCC)   . Tobacco abuse     Past Surgical History:  Procedure Laterality Date  . arm    . CHOLECYSTECTOMY    . EYE SURGERY    . TUBAL LIGATION       OB History   No obstetric history on file.      Home Medications    Prior to Admission medications   Medication Sig Start Date End Date Taking? Authorizing Provider  carbamazepine (TEGRETOL) 100 MG chewable tablet Chew 1 tablet (100 mg total) by mouth 3 (three) times daily. 04/29/18   Malvin JohnsFarah,  Brian, MD  doxycycline (VIBRAMYCIN) 100 MG capsule Take 1 capsule (100 mg total) by mouth 2 (two) times daily for 7 days. 10/08/18 10/15/18  Nimrit Kehres S, PA-C  gabapentin (NEURONTIN) 300 MG capsule Take 1 capsule (300 mg total) by mouth 3 (three) times daily. 04/29/18   Malvin JohnsFarah, Brian, MD  hydrOXYzine (ATARAX/VISTARIL) 25 MG tablet Take 1 tablet (25 mg total) by mouth 3 (three) times daily as needed for anxiety. 04/29/18   Malvin JohnsFarah, Brian, MD  vortioxetine HBr (TRINTELLIX) 10 MG TABS tablet Take 1 tablet (10 mg total) by mouth daily. 04/29/18   Malvin JohnsFarah, Brian, MD    Family History History reviewed. No pertinent family history.  Social History Social History   Tobacco Use  . Smoking status: Current Every Day Smoker    Packs/day: 1.00    Types: Cigarettes  . Smokeless tobacco: Never Used  Substance Use Topics  . Alcohol use: Yes  . Drug use: Yes    Types: Cocaine, Marijuana     Allergies   Patient has no known allergies.   Review of Systems Review of Systems  Constitutional: Negative for fever.  HENT: Negative for congestion.   Eyes: Negative for visual disturbance.  Respiratory: Negative for shortness of breath.  Cardiovascular: Negative for chest pain.  Gastrointestinal: Negative for abdominal pain, nausea and vomiting.  Genitourinary: Negative for dysuria.  Musculoskeletal:       RLE pain  Skin: Positive for color change.  Neurological: Negative for headaches.     Physical Exam Updated Vital Signs BP (!) 141/94 (BP Location: Right Arm)   Pulse 96   Temp 98.1 F (36.7 C) (Oral)   Resp 18   Ht 5\' 8"  (1.727 m)   Wt 66.7 kg   SpO2 96%   BMI 22.36 kg/m   Physical Exam Vitals signs and nursing note reviewed.  Constitutional:      General: She is not in acute distress.    Appearance: She is well-developed.  HENT:     Head: Normocephalic and atraumatic.  Eyes:     Conjunctiva/sclera: Conjunctivae normal.  Neck:     Musculoskeletal: Neck supple.  Cardiovascular:      Rate and Rhythm: Normal rate and regular rhythm.     Heart sounds: No murmur.  Pulmonary:     Effort: Pulmonary effort is normal. No respiratory distress.     Breath sounds: Normal breath sounds.  Abdominal:     Palpations: Abdomen is soft.     Tenderness: There is no abdominal tenderness.  Musculoskeletal:     Right lower leg: Edema (trace) present.     Comments: 9x5cm area of erythema to the medial aspect of the RLE that is warm to touch. No induration or fluctuance noted. No significant swelling to the right ankle. FROM of the ankle without pain. No calf TTP.   Skin:    General: Skin is warm and dry.  Neurological:     Mental Status: She is alert.      ED Treatments / Results  Labs (all labs ordered are listed, but only abnormal results are displayed) Labs Reviewed - No data to display  EKG None  Radiology No results found.  Procedures Procedures (including critical care time)  Medications Ordered in ED Medications - No data to display   Initial Impression / Assessment and Plan / ED Course  I have reviewed the triage vital signs and the nursing notes.  Pertinent labs & imaging results that were available during my care of the patient were reviewed by me and considered in my medical decision making (see chart for details).     Final Clinical Impressions(s) / ED Diagnoses   Final diagnoses:  Right leg pain  Cellulitis, unspecified cellulitis site   Pt presenting with redness and pain to the RLE ongoing for 2 weeks. No fevers. Today VS are reassuring, afebrile. No tachycardia or hypotension.  Pt is without gross abscess for which I&D would be possible.  Area marked and pt encouraged to return if redness begins to streak, extends beyond the markings, and/or fever or nausea/vomiting develop. Given that she does have some edema, ordered outpt [place] to r/o DVT. This is felt to be less likely and I feel risk outweighs benefit to give a dose of  anticoagulation today. Pt  is alert, oriented, NAD, afebrile, non tachycardic, nonseptic and nontoxic appearing.  Pt to be d/c on oral antibiotics with strict f/u instructions.     ED Discharge Orders         Ordered    US Venous Img Lower Unilateral Right     10/08/18 1840    doxycycline (VIBRAMYCIN) 100 MG capsule  2 times daily     10/08/18 1848  Karrie MeresCouture, Lindsy Cerullo S, PA-C 10/08/18 Thyra [id number]    Nanavati, Ankit, MD 10/11/18 1958

## 2018-10-08 NOTE — ED Notes (Signed)
Pt became very argumentative and insisted there is something wrong with her and her stomach is swelling before her eyes. Reports this is something ongoing for several months and then began rambling about symptoms she has been having for years ranging from her skin crawling to palpitations to random bruises and itching. Attempted to redirect pt to the problem with her leg as this is why she presented today and pt said "see this is the problem and why I will probably fall over and die, I need full testing done on everything and no hospital I go to will ever do it." Pt got up and walked out of triage.

## 2018-10-09 ENCOUNTER — Encounter (HOSPITAL_COMMUNITY): Payer: Self-pay | Admitting: Emergency Medicine

## 2018-10-09 ENCOUNTER — Emergency Department (HOSPITAL_COMMUNITY)
Admission: EM | Admit: 2018-10-09 | Discharge: 2018-10-09 | Disposition: A | Discharge status: Home / Self Care | Payer: Self-pay | Attending: Emergency Medicine | Admitting: Emergency Medicine

## 2018-10-09 ENCOUNTER — Other Ambulatory Visit: Payer: Self-pay

## 2018-10-09 DIAGNOSIS — K922 Gastrointestinal hemorrhage, unspecified: Secondary | ICD-10-CM | POA: Insufficient documentation

## 2018-10-09 DIAGNOSIS — F17210 Nicotine dependence, cigarettes, uncomplicated: Secondary | ICD-10-CM | POA: Insufficient documentation

## 2018-10-09 LAB — URINALYSIS, ROUTINE W REFLEX MICROSCOPIC
Bilirubin Urine: NEGATIVE
Glucose, UA: NEGATIVE mg/dL
Hgb urine dipstick: NEGATIVE
Ketones, ur: NEGATIVE mg/dL
Nitrite: NEGATIVE
Protein, ur: NEGATIVE mg/dL
Specific Gravity, Urine: 1.017 (ref 1.005–1.030)
pH: 5 (ref 5.0–8.0)

## 2018-10-09 LAB — CBC WITH DIFFERENTIAL/PLATELET
Abs Immature Granulocytes: 0.03 10*3/uL (ref 0.00–0.07)
Basophils Absolute: 0.1 10*3/uL (ref 0.0–0.1)
Basophils Relative: 1 %
Eosinophils Absolute: 0.2 10*3/uL (ref 0.0–0.5)
Eosinophils Relative: 2 %
HCT: 40.4 % (ref 36.0–46.0)
Hemoglobin: 12.8 g/dL (ref 12.0–15.0)
Immature Granulocytes: 0 %
Lymphocytes Relative: 25 %
Lymphs Abs: 2.2 10*3/uL (ref 0.7–4.0)
MCH: 29.2 pg (ref 26.0–34.0)
MCHC: 31.7 g/dL (ref 30.0–36.0)
MCV: 92.2 fL (ref 80.0–100.0)
Monocytes Absolute: 0.6 10*3/uL (ref 0.1–1.0)
Monocytes Relative: 7 %
Neutro Abs: 5.9 10*3/uL (ref 1.7–7.7)
Neutrophils Relative %: 65 %
Platelets: 327 10*3/uL (ref 150–400)
RBC: 4.38 MIL/uL (ref 3.87–5.11)
RDW: 14.9 % (ref 11.5–15.5)
WBC: 9.1 10*3/uL (ref 4.0–10.5)
nRBC: 0 % (ref 0.0–0.2)

## 2018-10-09 LAB — BASIC METABOLIC PANEL
Anion gap: 8 (ref 5–15)
BUN: 17 mg/dL (ref 6–20)
CO2: 25 mmol/L (ref 22–32)
Calcium: 8.9 mg/dL (ref 8.9–10.3)
Chloride: 108 mmol/L (ref 98–111)
Creatinine, Ser: 0.88 mg/dL (ref 0.44–1.00)
GFR calc Af Amer: >60 mL/min (ref >60)
GFR calc non Af Amer: >60 mL/min (ref >60)
Glucose, Bld: 110 mg/dL — ABNORMAL HIGH (ref 70–99)
Potassium: 4 mmol/L (ref 3.5–5.1)
Sodium: 141 mmol/L (ref 135–145)

## 2018-10-09 LAB — POC OCCULT BLOOD, ED: Fecal Occult Bld: POSITIVE — AB

## 2018-10-09 LAB — PREGNANCY, URINE: Preg Test, Ur: NEGATIVE

## 2018-10-09 LAB — SAMPLE TO BLOOD BANK

## 2018-10-09 LAB — LIPASE, BLOOD: Lipase: 39 U/L (ref 11–51)

## 2018-10-09 MED ORDER — SODIUM CHLORIDE 0.9% FLUSH: 3.0000 mL | Freq: Once | INTRAVENOUS | Status: DC

## 2018-10-09 MED ORDER — ONDANSETRON HCL 4 MG/2ML IJ SOLN: 4.0000 mg | Freq: Once | INTRAMUSCULAR | Status: DC | PRN

## 2018-10-09 NOTE — ED Notes (Signed)
Pt ambulatory to waiting room. Pt verbalized understanding of discharge instructions.   

## 2018-10-09 NOTE — ED Provider Notes (Signed)
[facility] EMERGENCY DEPARTMENT Provider Note   CSN: [id number] Arrival date & time: 10/09/18  1729    History   Chief Complaint Chief Complaint  Patient presents with  . Abdominal Pain    HPI Mervin HackCatrina L Newman is a 38 y.o. female.     HPI  38 y/o female - here yesterday for some redness on her inner lower leg - dx with possible cellulitis - did not get abx filled - looks better today -   C/o abdominal pain / discomfort "bloating" is the feeling - associated with some urgency when she got home today and had a BM that was dark red blood without stool - some nausea but no vomiting - no hx of GI bleed - has had CT abd a year ago without acute intraabd finsdings - but has hx of Hep C and drinks beer daily.  She has no other hx of coagulopathy but does endorse using aspirin 2-6 tabs a day (full dose).  She has mild sx at this time.  Past Medical History:  Diagnosis Date  . Anxiety   . Bipolar 1 disorder (HCC)   . Cocaine use   . ETOH abuse   . Hepatitis C   . Heroin abuse (HCC)   . MRSA (methicillin resistant Staphylococcus aureus)   . Suicide and self-inflicted injury (HCC)   . Suicide ideation   . Tobacco abuse     Patient Active Problem List   Diagnosis Date Noted  . 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 Major depressive disorder, recurrent, severe without psychotic features (HCC) 04/24/2018  . Cellulitis 09/07/2012  . Heroin abuse (HCC) 09/07/2012  . UTI (urinary tract infection) 09/07/2012  . Anxiety   . Bipolar 1 disorder (HCC)   . Tobacco abuse     Past Surgical History:  Procedure Laterality Date  . arm    . CHOLECYSTECTOMY    . EYE SURGERY    . TUBAL LIGATION       OB History   No obstetric history on file.      Home Medications    Prior to Admission medications   Medication Sig Start Date End Date Taking? Authorizing Provider  doxycycline (VIBRAMYCIN) 100 MG capsule Take 1 capsule (100  mg total) by mouth 2 (two) times daily for 7 days. 10/08/18 10/15/18  Couture, Cortni S, PA-C    Family History No family history on file.  Social History Social History   Tobacco Use  . Smoking status: Current Every Day Smoker    Packs/day: 1.00    Types: Cigarettes  . Smokeless tobacco: Never Used  Substance Use Topics  . Alcohol use: Yes  . Drug use: Yes    Types: Cocaine, Marijuana     Allergies   Patient has no known allergies.   Review of Systems Review of Systems  All other systems reviewed and are negative.    Physical Exam Updated Vital Signs BP (!) 145/74 (BP Location: Right Arm)   Pulse (!) 101   Temp 98.4 F (36.9 C) (Oral)   Resp 16   Ht 1.753 m (5\' 9" )   Wt 72.1 kg   LMP  (LMP Unknown)   SpO2 98%   BMI 23.48 kg/m   Physical Exam Vitals signs and nursing note reviewed.  Constitutional:      General: She is not in acute distress.    Appearance: She is well-developed.  HENT:     Head: Normocephalic and atraumatic.  Mouth/Throat:     Pharynx: No oropharyngeal exudate.  Eyes:     General: No scleral icterus.       Right eye: No discharge.        Left eye: No discharge.     Conjunctiva/sclera: Conjunctivae normal.     Pupils: Pupils are equal, round, and reactive to light.  Neck:     Musculoskeletal: Normal range of motion and neck supple.     Thyroid: No thyromegaly.     Vascular: No JVD.  Cardiovascular:     Rate and Rhythm: Normal rate and regular rhythm.     Heart sounds: Normal heart sounds. No murmur. No friction rub. No gallop.   Pulmonary:     Effort: Pulmonary effort is normal. No respiratory distress.     Breath sounds: Normal breath sounds. No wheezing or rales.  Abdominal:     General: Bowel sounds are normal. There is no distension.     Palpations: Abdomen is soft. There is no mass.     Tenderness: There is no abdominal tenderness.     Comments: Very soft, no ttp, no tympanitic sounds to percussion and no guarding.   Musculoskeletal: Normal range of motion.        General: No tenderness.  Lymphadenopathy:     Cervical: No cervical adenopathy.  Skin:    General: Skin is warm and dry.     Findings: No erythema or rash.  Neurological:     Mental Status: She is alert.     Coordination: Coordination normal.  Psychiatric:        Behavior: Behavior normal.      ED Treatments / Results  Labs (all labs ordered are listed, but only abnormal results are displayed) Labs Reviewed  BASIC METABOLIC PANEL - Abnormal; Notable for the following components:      Result Value   Glucose, Bld 110 (*)    All other components within normal limits  URINALYSIS, ROUTINE W REFLEX MICROSCOPIC - Abnormal; Notable for the following components:   Leukocytes,Ua SMALL (*)    Bacteria, UA RARE (*)    All other components within normal limits  POC OCCULT BLOOD, ED - Abnormal; Notable for the following components:   Fecal Occult Bld POSITIVE (*)    All other components within normal limits  CBC WITH DIFFERENTIAL/PLATELET  LIPASE, BLOOD  PREGNANCY, URINE  SAMPLE TO BLOOD BANK    EKG None  Radiology No results found.  Procedures Procedures (including critical care time)  Medications Ordered in ED Medications  sodium chloride flush (NS) 0.9 % injection 3 mL (has no administration in time range)  ondansetron (ZOFRAN) injection 4 mg (has no administration in time range)     Initial Impression / Assessment and Plan / ED Course  I have reviewed the triage vital signs and the nursing notes.  Pertinent labs & imaging results that were available during my care of the patient were reviewed by me and considered in my medical decision making (see chart for details).        Labs reassuring - needs rectal exam, no other acute findings - counseled pt on the use of aspirin and avoidance - as well as reducing alcohol use - also endorses some use of cocaine - at this time, pt agreeable to rectal exam, may need outpt f/u -  her VS are normal on my exam.  Chaperone present for rectal exam - normal appearing anus, no fissures, or hemorrhoids - normal appearing perineum.  Has  no masses on internal abnormlaities - the stool is absent - some brown stool flakes which are heme positive present.  Pt is upset that there is no answer - I have discussed all of her results - she will f/u w[facility] GI = is agreeable with same and reducing ETOH and ASA use.  Non tender abd on d/c.  Final Clinical Impressions(s) / ED Diagnoses   Final diagnoses:  Lower GI bleeding      Noemi Chapel, MD 10/09/18 2125

## 2018-10-09 NOTE — Discharge Instructions (Addendum)
Tylenol only for pain (no more than one tablet every 6 hours), no more aspirin and reduce and eventually stop drinking alcohol - these things will make you bleed more easily.  Please call Dr. Gala Romney in the morning - to make appointment for this week.  ER for increased weakness / shortness of breath, dizziness or heavy bleeding.

## 2018-10-09 NOTE — ED Triage Notes (Signed)
Patient c/o generalized abd pain with back pain x3 days. Patient c/o abd distension. Per patient has hep C and drink alcohol. Patient states she drunk alcohol. Patient reports large amount of bright and dark red, bloody stool today. Patient reports nausea, vomiting, diarrhea, and intermittent fever. Patient states she was here yesterday for infection to skin (cellulitis). Patient states she informed doctor yesterday of her abd pain and was told they were only focusing on the infection to her leg. Patient was placed on antibiotic but is unsure of the name and has not gotten it filled.

## 2018-10-13 ENCOUNTER — Encounter: Payer: Self-pay | Admitting: Gastroenterology

## 2018-10-22 ENCOUNTER — Encounter (HOSPITAL_COMMUNITY): Payer: Self-pay | Admitting: Emergency Medicine

## 2018-10-22 ENCOUNTER — Emergency Department (HOSPITAL_COMMUNITY)
Admission: EM | Admit: 2018-10-22 | Discharge: 2018-10-22 | Disposition: A | Discharge status: Home / Self Care | Payer: Self-pay | Attending: Emergency Medicine | Admitting: Emergency Medicine

## 2018-10-22 ENCOUNTER — Other Ambulatory Visit: Payer: Self-pay

## 2018-10-22 DIAGNOSIS — F17210 Nicotine dependence, cigarettes, uncomplicated: Secondary | ICD-10-CM | POA: Insufficient documentation

## 2018-10-22 DIAGNOSIS — M7661 Achilles tendinitis, right leg: Secondary | ICD-10-CM | POA: Insufficient documentation

## 2018-10-22 NOTE — Discharge Instructions (Signed)
Elevate your leg when possible and apply ice packs on/off.  Wear the brace when standing or walking and wear supportive shoes, no sandals or crocs.  Tylenol every 4 hrs if needed for pain.    You are scheduled to return here tomorrow (Sunday) to have an ultrasound of your right leg.  Be here by 8:45 am for your appoitment at 9:00 am.

## 2018-10-22 NOTE — ED Provider Notes (Signed)
[facility] EMERGENCY DEPARTMENT Provider Note   CSN: [id number] Arrival date & time: 10/22/18  1558     History   Chief Complaint Chief Complaint  Patient presents with   Leg Swelling    HPI Gail Castro is a 37 y.o. female.     HPI  Gail Castro is a 38 y.o. female who presents to the Emergency Department complaining of persistent right lower leg pain and swelling.  She was seen here for same first of August and and noticed some redness along her lower leg and Achilles tendon.  She was prescribed antibiotics and she states the redness improved but she continues to have pain and swelling.  Her symptoms began after walking while at the beach.  She denies known injury.  She complains of pain with flexion of her foot.  Pain radiates to her calf.  She was scheduled for an ultrasound of her right leg on her previous visit, but she did not keep the appointment.  She denies redness, pain to her thigh, numbness of the extremity, recent extended travel, chest pain or shortness of breath.  She has been applying ice intermittently without relief.  She does admit to wearing flip-flops and crocs to work.   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      Home Medications    Prior to Admission medications   Not on File    Family History No family history on file.  Social History Social History   Tobacco Use   Smoking status: Current Every Day Smoker    Packs/day: 1.00    Types: Cigarettes   Smokeless tobacco: Never Used  Substance Use Topics   Alcohol use: Yes   Drug use: Yes    Types: Cocaine, Marijuana     Allergies   Patient has no known allergies.   Review of Systems Review of Systems  Constitutional: Negative for chills and fever.  Respiratory: Negative for shortness of breath.   Cardiovascular: Negative for chest pain.  Musculoskeletal: Positive for arthralgias (Right lower leg pain and swelling of the posterior ankle). Negative for joint swelling.  Skin: Negative for color change, rash and wound.  Neurological: Negative for dizziness, weakness and numbness.     Physical Exam Updated Vital Signs BP (!) 133/93 (BP Location: Right Arm)    Pulse 70    Temp 98.1 F (36.7 C) (Oral)    Resp 16    Ht 5\' 9"  (1.753 m)    Wt 74.8  kg    LMP  (LMP Unknown)    SpO2 100%    BMI 24.37 kg/m   Physical Exam Vitals signs and nursing note reviewed.  Constitutional:      General: She is not in acute distress.    Appearance: Normal appearance. She is not toxic-appearing.  HENT:     Head: Atraumatic.  Neck:     Musculoskeletal: Normal range of motion.  Cardiovascular:     Rate and Rhythm: Normal rate and regular rhythm.     Pulses: Normal pulses.  Pulmonary:     Effort: Pulmonary effort is normal.     Breath sounds: Normal breath sounds.  Musculoskeletal:        General: Swelling and tenderness present.     Right lower leg: She exhibits tenderness and swelling.       Legs:     Comments: Localized tenderness to palpation along the right Achilles tendon extending into the distal calf.  Mild edema noted.  Thompson test  is negative.  No tenderness of the hamstring.  No swelling of the popliteal fossa.  Pain reproduced on dorsiflexion and plantarflexion of the right foot.  No erythema.  Skin:    General: Skin is warm.     Capillary Refill: Capillary refill takes less than 2 seconds.  Neurological:     General: No focal deficit present.     Sensory: No sensory deficit.     Motor: No weakness.      ED Treatments / Results  Labs (all labs ordered are listed, but only abnormal results are displayed) Labs Reviewed - No data to display  EKG None  Radiology No results found.  Procedures Procedures (including critical care time)  Medications Ordered in ED Medications - No data to display   Initial Impression / Assessment and Plan / ED Course  I have reviewed the triage vital signs and the nursing notes.  Pertinent labs & imaging results that were available during my care of the patient were reviewed by me and considered in my medical decision making (see chart for details).        Pt seen here on 10/08/18 for pain and swelling to her right LE. She was treated for cellulitis and outpatient venous US was ordered, but pt never had the US.  Order is still in system.  Spoke with radiology tech and US is now scheduled for tomorrow morning at 9:00 am.     I feel her sx's are likely related to Achilles tendonitis.  No evidence of cellulitis at this time, NV intact.  Pt would likely benefit from NSAID, but she cannot take them due to hx of GI bleed.  Will take tylenol.  She agrees to ASO for support, elevate, ice and she will return here tomorrow for the ultrasound   Final Clinical Impressions(s) / ED Diagnoses   Final diagnoses:  Achilles tendinitis, right leg    ED Discharge Orders    None       Rosey Bathriplett, Maryum Batterson, PA-C 10/22/18 2035    Bethann BerkshireZammit, Joseph, MD 10/25/18 1312

## 2018-10-22 NOTE — ED Triage Notes (Signed)
Pt c/o of right leg swelling x 2 weeks.

## 2018-10-23 ENCOUNTER — Other Ambulatory Visit (HOSPITAL_COMMUNITY): Payer: Self-pay | Admitting: Emergency Medicine

## 2018-10-23 ENCOUNTER — Ambulatory Visit (HOSPITAL_COMMUNITY)
Admission: RE | Admit: 2018-10-23 | Discharge: 2018-10-23 | Disposition: A | Discharge status: Home / Self Care | Payer: Self-pay | Source: Ambulatory Visit | Attending: Emergency Medicine | Admitting: Emergency Medicine

## 2018-10-23 DIAGNOSIS — M79661 Pain in right lower leg: Secondary | ICD-10-CM

## 2018-11-10 ENCOUNTER — Telehealth: Payer: Self-pay | Admitting: Gastroenterology

## 2018-11-10 ENCOUNTER — Encounter: Payer: Self-pay | Admitting: Gastroenterology

## 2018-11-10 ENCOUNTER — Ambulatory Visit: Payer: Self-pay | Admitting: Gastroenterology

## 2018-11-10 NOTE — Telephone Encounter (Signed)
REVIEWED-NO ADDITIONAL RECOMMENDATIONS. 

## 2018-11-10 NOTE — Telephone Encounter (Signed)
PATIENT WAS A NO SHOW AND LETTER SENT  °

## 2018-12-01 ENCOUNTER — Emergency Department (HOSPITAL_COMMUNITY)
Admission: EM | Admit: 2018-12-01 | Discharge: 2018-12-01 | Disposition: A | Discharge status: Home / Self Care | Payer: Self-pay | Attending: Emergency Medicine | Admitting: Emergency Medicine

## 2018-12-01 ENCOUNTER — Other Ambulatory Visit: Payer: Self-pay

## 2018-12-01 ENCOUNTER — Encounter (HOSPITAL_COMMUNITY): Payer: Self-pay | Admitting: Emergency Medicine

## 2018-12-01 DIAGNOSIS — M79602 Pain in left arm: Secondary | ICD-10-CM | POA: Insufficient documentation

## 2018-12-01 DIAGNOSIS — Z5321 Procedure and treatment not carried out due to patient leaving prior to being seen by health care provider: Secondary | ICD-10-CM | POA: Insufficient documentation

## 2018-12-01 DIAGNOSIS — Y998 Other external cause status: Secondary | ICD-10-CM | POA: Insufficient documentation

## 2018-12-01 DIAGNOSIS — S40022A Contusion of left upper arm, initial encounter: Secondary | ICD-10-CM | POA: Insufficient documentation

## 2018-12-01 DIAGNOSIS — X58XXXA Exposure to other specified factors, initial encounter: Secondary | ICD-10-CM | POA: Insufficient documentation

## 2018-12-01 DIAGNOSIS — Y939 Activity, unspecified: Secondary | ICD-10-CM | POA: Insufficient documentation

## 2018-12-01 DIAGNOSIS — R079 Chest pain, unspecified: Secondary | ICD-10-CM | POA: Insufficient documentation

## 2018-12-01 DIAGNOSIS — Y929 Unspecified place or not applicable: Secondary | ICD-10-CM | POA: Insufficient documentation

## 2018-12-01 NOTE — ED Triage Notes (Signed)
Bruise and pain to LT upper arm, that she just noticed today.  Also reports chest pain that started today.  Pt reports she was seen for cp last week and was dx with panic attack.

## 2018-12-01 NOTE — ED Notes (Signed)
Pt states she did use a little meth this morning and she thinks that is where her anxiety is coming from.  Pt states she is going to go home and she will come back if she feels any worse.

## 2018-12-07 ENCOUNTER — Other Ambulatory Visit: Payer: Self-pay

## 2018-12-07 ENCOUNTER — Encounter (HOSPITAL_COMMUNITY): Payer: Self-pay

## 2018-12-07 DIAGNOSIS — F1920 Other psychoactive substance dependence, uncomplicated: Secondary | ICD-10-CM | POA: Insufficient documentation

## 2018-12-07 DIAGNOSIS — F332 Major depressive disorder, recurrent severe without psychotic features: Secondary | ICD-10-CM | POA: Insufficient documentation

## 2018-12-07 DIAGNOSIS — R45851 Suicidal ideations: Secondary | ICD-10-CM | POA: Insufficient documentation

## 2018-12-07 DIAGNOSIS — F17210 Nicotine dependence, cigarettes, uncomplicated: Secondary | ICD-10-CM | POA: Insufficient documentation

## 2018-12-07 DIAGNOSIS — Z20828 Contact with and (suspected) exposure to other viral communicable diseases: Secondary | ICD-10-CM | POA: Insufficient documentation

## 2018-12-07 NOTE — ED Triage Notes (Signed)
Pt arrived stating she is using drugs and suicidal. Pt states she used an unknown drug 15 minutes ago that was brown. Plans to overdose.

## 2018-12-08 ENCOUNTER — Encounter (HOSPITAL_COMMUNITY): Payer: Self-pay

## 2018-12-08 ENCOUNTER — Emergency Department (HOSPITAL_COMMUNITY)
Admission: EM | Admit: 2018-12-08 | Discharge: 2018-12-08 | Disposition: A | Discharge status: Other Acute Inpatient Hospital | Payer: Self-pay | Attending: Emergency Medicine | Admitting: Emergency Medicine

## 2018-12-08 ENCOUNTER — Inpatient Hospital Stay (HOSPITAL_COMMUNITY)
Admission: AD | Admit: 2018-12-08 | Discharge: 2018-12-14 | DRG: 885 | Disposition: A | Discharge status: Home / Self Care | Payer: No Typology Code available for payment source | Source: Intra-hospital | Attending: Psychiatry | Admitting: Psychiatry

## 2018-12-08 ENCOUNTER — Encounter (HOSPITAL_COMMUNITY): Payer: Self-pay | Admitting: Behavioral Health

## 2018-12-08 DIAGNOSIS — F1210 Cannabis abuse, uncomplicated: Secondary | ICD-10-CM | POA: Diagnosis present

## 2018-12-08 DIAGNOSIS — F1410 Cocaine abuse, uncomplicated: Secondary | ICD-10-CM | POA: Diagnosis present

## 2018-12-08 DIAGNOSIS — F332 Major depressive disorder, recurrent severe without psychotic features: Secondary | ICD-10-CM | POA: Diagnosis present

## 2018-12-08 DIAGNOSIS — Z79899 Other long term (current) drug therapy: Secondary | ICD-10-CM

## 2018-12-08 DIAGNOSIS — F1310 Sedative, hypnotic or anxiolytic abuse, uncomplicated: Secondary | ICD-10-CM | POA: Diagnosis present

## 2018-12-08 DIAGNOSIS — R450 Nervousness: Secondary | ICD-10-CM | POA: Diagnosis not present

## 2018-12-08 DIAGNOSIS — Z20828 Contact with and (suspected) exposure to other viral communicable diseases: Secondary | ICD-10-CM | POA: Diagnosis present

## 2018-12-08 DIAGNOSIS — G4700 Insomnia, unspecified: Secondary | ICD-10-CM | POA: Diagnosis present

## 2018-12-08 DIAGNOSIS — G8929 Other chronic pain: Secondary | ICD-10-CM | POA: Diagnosis present

## 2018-12-08 DIAGNOSIS — F10239 Alcohol dependence with withdrawal, unspecified: Secondary | ICD-10-CM | POA: Diagnosis present

## 2018-12-08 DIAGNOSIS — F17210 Nicotine dependence, cigarettes, uncomplicated: Secondary | ICD-10-CM | POA: Diagnosis present

## 2018-12-08 DIAGNOSIS — F1024 Alcohol dependence with alcohol-induced mood disorder: Secondary | ICD-10-CM | POA: Diagnosis not present

## 2018-12-08 DIAGNOSIS — F1424 Cocaine dependence with cocaine-induced mood disorder: Secondary | ICD-10-CM | POA: Diagnosis not present

## 2018-12-08 DIAGNOSIS — F410 Panic disorder [episodic paroxysmal anxiety] without agoraphobia: Secondary | ICD-10-CM | POA: Diagnosis present

## 2018-12-08 DIAGNOSIS — R519 Headache, unspecified: Secondary | ICD-10-CM

## 2018-12-08 DIAGNOSIS — F1920 Other psychoactive substance dependence, uncomplicated: Secondary | ICD-10-CM

## 2018-12-08 DIAGNOSIS — R110 Nausea: Secondary | ICD-10-CM | POA: Diagnosis not present

## 2018-12-08 DIAGNOSIS — R45851 Suicidal ideations: Secondary | ICD-10-CM

## 2018-12-08 DIAGNOSIS — F1910 Other psychoactive substance abuse, uncomplicated: Secondary | ICD-10-CM

## 2018-12-08 DIAGNOSIS — Z915 Personal history of self-harm: Secondary | ICD-10-CM | POA: Diagnosis not present

## 2018-12-08 DIAGNOSIS — F4310 Post-traumatic stress disorder, unspecified: Secondary | ICD-10-CM | POA: Diagnosis present

## 2018-12-08 DIAGNOSIS — F1510 Other stimulant abuse, uncomplicated: Secondary | ICD-10-CM | POA: Diagnosis present

## 2018-12-08 DIAGNOSIS — F1994 Other psychoactive substance use, unspecified with psychoactive substance-induced mood disorder: Secondary | ICD-10-CM

## 2018-12-08 LAB — COMPREHENSIVE METABOLIC PANEL
ALT: 36 U/L (ref 0–44)
AST: 31 U/L (ref 15–41)
Albumin: 3.8 g/dL (ref 3.5–5.0)
Alkaline Phosphatase: 60 U/L (ref 38–126)
Anion gap: 9 (ref 5–15)
BUN: 11 mg/dL (ref 6–20)
CO2: 25 mmol/L (ref 22–32)
Calcium: 8.7 mg/dL — ABNORMAL LOW (ref 8.9–10.3)
Chloride: 104 mmol/L (ref 98–111)
Creatinine, Ser: 0.85 mg/dL (ref 0.44–1.00)
GFR calc Af Amer: >60 mL/min (ref >60)
GFR calc non Af Amer: >60 mL/min (ref >60)
Glucose, Bld: 103 mg/dL — ABNORMAL HIGH (ref 70–99)
Potassium: 3.5 mmol/L (ref 3.5–5.1)
Sodium: 138 mmol/L (ref 135–145)
Total Bilirubin: 0.2 mg/dL — ABNORMAL LOW (ref 0.3–1.2)
Total Protein: 7.1 g/dL (ref 6.5–8.1)

## 2018-12-08 LAB — CBC
HCT: 44.6 % (ref 36.0–46.0)
Hemoglobin: 13.6 g/dL (ref 12.0–15.0)
MCH: 28 pg (ref 26.0–34.0)
MCHC: 30.5 g/dL (ref 30.0–36.0)
MCV: 92 fL (ref 80.0–100.0)
Platelets: 368 10*3/uL (ref 150–400)
RBC: 4.85 MIL/uL (ref 3.87–5.11)
RDW: 13.6 % (ref 11.5–15.5)
WBC: 12.9 10*3/uL — ABNORMAL HIGH (ref 4.0–10.5)
nRBC: 0 % (ref 0.0–0.2)

## 2018-12-08 LAB — RAPID URINE DRUG SCREEN, HOSP PERFORMED
Amphetamines: POSITIVE — AB
Barbiturates: NOT DETECTED
Benzodiazepines: POSITIVE — AB
Cocaine: POSITIVE — AB
Opiates: NOT DETECTED
Tetrahydrocannabinol: NOT DETECTED

## 2018-12-08 LAB — I-STAT BETA HCG BLOOD, ED (MC, WL, AP ONLY): I-stat hCG, quantitative: <5 m[IU]/mL (ref <5)

## 2018-12-08 LAB — ETHANOL: Alcohol, Ethyl (B): 80 mg/dL — ABNORMAL HIGH (ref <10)

## 2018-12-08 LAB — SARS CORONAVIRUS 2 BY RT PCR (HOSPITAL ORDER, PERFORMED IN ~~LOC~~ HOSPITAL LAB): SARS Coronavirus 2: NEGATIVE

## 2018-12-08 LAB — ACETAMINOPHEN LEVEL: Acetaminophen (Tylenol), Serum: <10 ug/mL — ABNORMAL LOW (ref 10–30)

## 2018-12-08 LAB — SALICYLATE LEVEL: Salicylate Lvl: <7 mg/dL (ref 2.8–30.0)

## 2018-12-08 MED ORDER — CARBAMAZEPINE 200 MG PO TABS
100.0000 mg | ORAL_TABLET | Freq: Three times a day (TID) | ORAL | Status: DC
  Administered 2018-12-08 – 2018-12-09 (×3): 100 mg via ORAL
  Filled 2018-12-08: qty 1
  Filled 2018-12-08 (×7): qty 0.5
  Filled 2018-12-08: qty 1
  Filled 2018-12-08: qty 0.5

## 2018-12-08 MED ORDER — HYDROXYZINE HCL 25 MG PO TABS
25.0000 mg | ORAL_TABLET | Freq: Four times a day (QID) | ORAL | Status: DC | PRN
  Administered 2018-12-09 – 2018-12-11 (×3): 25 mg via ORAL
  Filled 2018-12-08 (×3): qty 1

## 2018-12-08 MED ORDER — VITAMIN B-1 100 MG PO TABS
100.0000 mg | ORAL_TABLET | Freq: Every day | ORAL | Status: DC
  Administered 2018-12-09 – 2018-12-14 (×6): 100 mg via ORAL
  Filled 2018-12-08 (×8): qty 1

## 2018-12-08 MED ORDER — CHLORDIAZEPOXIDE HCL 25 MG PO CAPS
25.0000 mg | ORAL_CAPSULE | Freq: Four times a day (QID) | ORAL | Status: AC | PRN
  Administered 2018-12-09 – 2018-12-11 (×7): 25 mg via ORAL
  Filled 2018-12-08 (×7): qty 1

## 2018-12-08 MED ORDER — ONDANSETRON 4 MG PO TBDP: 4.0000 mg | ORAL_TABLET | Freq: Four times a day (QID) | ORAL | Status: DC | PRN

## 2018-12-08 MED ORDER — THIAMINE HCL 100 MG/ML IJ SOLN
100.0000 mg | Freq: Once | INTRAMUSCULAR | Status: DC
  Filled 2018-12-08: qty 2

## 2018-12-08 MED ORDER — ESCITALOPRAM OXALATE 5 MG PO TABS
5.0000 mg | ORAL_TABLET | Freq: Every day | ORAL | Status: DC
  Administered 2018-12-09 – 2018-12-10 (×2): 5 mg via ORAL
  Filled 2018-12-08 (×4): qty 1

## 2018-12-08 MED ORDER — ADULT MULTIVITAMIN W/MINERALS CH
1.0000 | ORAL_TABLET | Freq: Every day | ORAL | Status: DC
  Administered 2018-12-08 – 2018-12-14 (×7): 1 via ORAL
  Filled 2018-12-08 (×10): qty 1

## 2018-12-08 MED ORDER — LOPERAMIDE HCL 2 MG PO CAPS: 2.0000 mg | ORAL_CAPSULE | ORAL | Status: DC | PRN

## 2018-12-08 NOTE — ED Notes (Addendum)
Pt off unit to [facility] per provider.   Pt alert, no s/s of distress.  DC information given to [employer] for [facility].  Belongings given to pt, pt stated missing shirt, pant and slide on flip flops(nike), belongings were looked for, could not find.  Pt ambulatory off unit. [facility] [employer] was transportation.

## 2018-12-08 NOTE — ED Notes (Signed)
Pt given crackers, a sandwich, and drink per request. Stating she needs her phone, this nurse explained our policy and offered to call someone for her belongings. Pt began yelling stating that she can just go kill herself and die alone to just check her out. After speaking with friend she has calmed down and plans to stay.

## 2018-12-08 NOTE — ED Notes (Signed)
Pt family updated

## 2018-12-08 NOTE — Progress Notes (Signed)
Patient did not attend wrap-up group because she was asleep.  
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
## 2018-12-08 NOTE — Discharge Summary (Addendum)
  Patient is to be transferred to [place] for inpatient psychiatric treatment  Patient's chart reviewed. Reviewed the information documented and agree with the treatment plan.  Buford Dresser, DO 12/08/18 1:40 PM
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
## 2018-12-08 NOTE — ED Notes (Signed)
Gail Castro [id number]
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
## 2018-12-08 NOTE — BH Assessment (Signed)
[facility] Assessment Progress Note  Per Buford Dresser, DO, this pt requires psychiatric hospitalization at this time.  Heather, RN has assigned pt to [facility] Rm 302-2.  Pt has signed Voluntary Admission and Consent for Treatment, as well as Consent to Release Information to her lawyer, and signed forms have been faxed to [facility].  Pt's nurse, Eustaquio Maize, has been notified, and agrees to send original paperwork along with pt via [facility], and to call report to [phone].  Jalene Mullet, [place] Coordinator [phone]

## 2018-12-08 NOTE — Tx Team (Addendum)
Initial Treatment Plan 12/08/2018 3:07 PM KINSLY HILD [id number]    PATIENT STRESSORS: Financial difficulties Health problems Marital or family conflict Occupational concerns Substance abuse   PATIENT STRENGTHS: Work Psychologist, occupational   PATIENT IDENTIFIED PROBLEMS: Suicidal ideations  depression  anxiety  Drug abuse               DISCHARGE CRITERIA:  Ability to meet basic life and health needs Adequate post-discharge living arrangements Improved stabilization in mood, thinking, and/or behavior Medical problems require only outpatient monitoring  PRELIMINARY DISCHARGE PLAN: Attend 12-step recovery group Outpatient therapy Placement in alternative living arrangements Return to previous work or school arrangements  PATIENT/FAMILY INVOLVEMENT: This treatment plan has been presented to and reviewed with the patient, Gail Castro.  The patient and family have been given the opportunity to ask questions and make suggestions.  Baron Sane, RN 12/08/2018, 3:07 PM

## 2018-12-08 NOTE — ED Provider Notes (Signed)
[facility]-EMERGENCY DEPT Provider Note  CSN: [id number] Arrival date & time: 12/07/18 2229  Chief Complaint(s) Suicidal  HPI Gail Castro is a 38 y.o. female past medical history listed below including bipolar and polysubstance abuse who presents to the emergency department with suicidal ideations.  She reports prior suicide attempts.  Endorses daily alcohol use and drug use.  States that her suicide ideations get worse with drug use.  Denies any other physical complaints at this time.  HPI  Past Medical History Past Medical History:  Diagnosis Date  . Anxiety   . Bipolar 1 disorder ([facility])   . Cocaine use   . ETOH abuse   . Hepatitis C   . Heroin abuse ([facility])   . MRSA (methicillin resistant Staphylococcus aureus)   . Suicide and self-inflicted injury ([facility])   . Suicide ideation   . Tobacco abuse    Patient Active Problem List   Diagnosis Date Noted  . 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 Major depressive disorder, recurrent, severe without psychotic features ([facility]) 04/24/2018  . Cellulitis 09/07/2012  . Heroin abuse ([facility]) 09/07/2012  . UTI (urinary tract infection) 09/07/2012  . Anxiety   . Bipolar 1 disorder ([facility])   . Tobacco abuse    Home Medication(s) Prior to Admission medications   Not on File                                                                                                                                    Past Surgical History Past Surgical History:  Procedure Laterality Date  . arm    . CHOLECYSTECTOMY    . EYE SURGERY    . TUBAL LIGATION     Family History No family history on file.  Social History Social History   Tobacco Use  . Smoking status: Current Every Day Smoker    Packs/day: 1.00    Types: Cigarettes  . Smokeless tobacco: Never Used  Substance Use Topics  . Alcohol use: Yes    Comment: last night.  drinks every day   . Drug use: Yes   Types: Cocaine, Marijuana, Methamphetamines    Comment: used meth sun or mon    Allergies Patient has no known allergies.  Review of Systems Review of Systems All other systems are reviewed and are negative for acute change except as noted in the HPI  Physical Exam Vital Signs  I have reviewed the triage vital signs BP 109/74 (BP Location: Right Arm)   Pulse 70   Temp 98.1 F (36.7 C) (Oral)   Resp 17   Ht 5\' 9"  (1.753 m)   Wt 72.6 kg   SpO2 98%   BMI 23.63 kg/m   Physical Exam Vitals signs reviewed.  Constitutional:      General: She is not in acute distress.    Appearance: She is well-developed. She is not diaphoretic.  HENT:     Head: Normocephalic and atraumatic.     Right Ear: External ear normal.     Left Ear: External ear normal.     Nose: Nose normal.  Eyes:     General: No scleral icterus.    Conjunctiva/sclera: Conjunctivae normal.  Neck:     Musculoskeletal: Normal range of motion.     Trachea: Phonation normal.  Cardiovascular:     Rate and Rhythm: Normal rate and regular rhythm.  Pulmonary:     Effort: Pulmonary effort is normal. No respiratory distress.     Breath sounds: No stridor.  Abdominal:     General: There is no distension.  Musculoskeletal: Normal range of motion.  Skin:    Comments: Remote scars on wrist and arm  Neurological:     Mental Status: She is alert and oriented to person, place, and time.  Psychiatric:        Behavior: Behavior normal.     ED Results and Treatments Labs (all labs ordered are listed, but only abnormal results are displayed) Labs Reviewed  COMPREHENSIVE METABOLIC PANEL - Abnormal; Notable for the following components:      Result Value   Glucose, Bld 103 (*)    Calcium 8.7 (*)    Total Bilirubin 0.2 (*)    All other components within normal limits  ETHANOL - Abnormal; Notable for the following components:   Alcohol, Ethyl (B) 80 (*)    All other components within normal limits  ACETAMINOPHEN LEVEL -  Abnormal; Notable for the following components:   Acetaminophen (Tylenol), Serum <10 (*)    All other components within normal limits  CBC - Abnormal; Notable for the following components:   WBC 12.9 (*)    All other components within normal limits  RAPID URINE DRUG SCREEN, HOSP PERFORMED - Abnormal; Notable for the following components:   Cocaine POSITIVE (*)    Benzodiazepines POSITIVE (*)    Amphetamines POSITIVE (*)    All other components within normal limits  SALICYLATE LEVEL  I-STAT BETA HCG BLOOD, ED ([facility], [facility], [facility] ONLY)                                                                                                                         EKG  EKG Interpretation  Date/Time:    Ventricular Rate:    PR Interval:    QRS Duration:   QT Interval:    QTC Calculation:   R Axis:     Text Interpretation:        Radiology No results found.  Pertinent labs & imaging results that were available during my care of the patient were reviewed by me and considered in my medical decision making (see chart for details).  Medications Ordered in ED Medications - No data to display  Procedures Procedures  (including critical care time)  Medical Decision Making / ED Course I have reviewed the nursing notes for this encounter and the patient's prior records (if available in EHR or on provided paperwork).   Gail Castro was evaluated in Emergency Department on 12/08/2018 for the symptoms described in the history of present illness. She was evaluated in the context of the global COVID-19 pandemic, which necessitated consideration that the patient might be at risk for infection with the SARS-CoV-2 virus that causes COVID-19. Institutional protocols and algorithms that pertain to the evaluation of patients at risk for COVID-19 are in a state of rapid change based on  information released by regulatory bodies including the CDC and federal and state organizations. These policies and algorithms were followed during the patient's care in the ED.  Voluntarily here for suicidal ideation.  Screening labs obtained.  Medically clear for behavioral health evaluation and disposition.      Final Clinical Impression(s) / ED Diagnoses Final diagnoses:  Suicidal ideation  Polysubstance abuse ([place])      This chart was dictated using voice recognition software.  Despite best efforts to proofread,  errors can occur which can change the documentation meaning.   Fatima Blank, MD 12/08/18 [phone]
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
## 2018-12-08 NOTE — ED Notes (Signed)
Roomed in 47. States she is here for detox when I asked her if she was here for SI. States she is using ETOH, meth, and cocaine. On admission her only complaint was being really cold. She declined offers for food and drink at this time. Gave her several warm blankets did a brief assessment and let her rest. Personal property in day room of the acute unit due to the size and amount of bags. She has 3 large open tote style bags.

## 2018-12-09 DIAGNOSIS — F1024 Alcohol dependence with alcohol-induced mood disorder: Secondary | ICD-10-CM | POA: Diagnosis not present

## 2018-12-09 DIAGNOSIS — F1424 Cocaine dependence with cocaine-induced mood disorder: Secondary | ICD-10-CM | POA: Diagnosis not present

## 2018-12-09 MED ORDER — CARBAMAZEPINE 100 MG PO CHEW
100.0000 mg | CHEWABLE_TABLET | Freq: Three times a day (TID) | ORAL | Status: DC
  Administered 2018-12-09 – 2018-12-12 (×8): 100 mg via ORAL
  Filled 2018-12-09 (×11): qty 1

## 2018-12-09 MED ORDER — NICOTINE 21 MG/24HR TD PT24
21.0000 mg | MEDICATED_PATCH | Freq: Every day | TRANSDERMAL | Status: DC
  Administered 2018-12-09 – 2018-12-14 (×6): 21 mg via TRANSDERMAL
  Filled 2018-12-09 (×6): qty 1

## 2018-12-09 MED ORDER — ACETAMINOPHEN 325 MG PO TABS
650.0000 mg | ORAL_TABLET | Freq: Four times a day (QID) | ORAL | Status: DC | PRN
  Administered 2018-12-09 (×2): 650 mg via ORAL
  Filled 2018-12-09 (×2): qty 2

## 2018-12-09 MED ORDER — ONDANSETRON 4 MG PO TBDP
4.0000 mg | ORAL_TABLET | Freq: Three times a day (TID) | ORAL | Status: DC | PRN
  Administered 2018-12-09 – 2018-12-10 (×2): 4 mg via ORAL
  Filled 2018-12-09 (×2): qty 1

## 2018-12-09 MED ORDER — DOXEPIN HCL 25 MG PO CAPS
25.0000 mg | ORAL_CAPSULE | Freq: Once | ORAL | Status: AC
  Administered 2018-12-09: 23:00:00 25 mg via ORAL
  Filled 2018-12-09 (×2): qty 1

## 2018-12-09 NOTE — Progress Notes (Signed)
D: Patient did not attend group and interacted with this writer minimal.  A: Pt was offered support and encouragement. Pt was given scheduled medications. Pt was encourage to attend groups. Q 15 minute checks were done for safety.  R: Pt is taking medication. Pt has no complaints.Pt receptive to treatment and safety maintained on unit.

## 2018-12-09 NOTE — Progress Notes (Signed)
[facility] MD Progress Note  12/09/2018 3:48 PM Gail Castro  MRN:  [id number] Subjective: Patient reports feeling "tired" and subjectively irritable.  She reports some subjective feelings of withdrawal, mainly feeling "hot, stuffy" and subjectively "jittery".  Denies suicidal ideations.  Currently denies medication side effect. Objective: I have discussed case with treatment team and I met with patient. 74 y old female, presented to hospital reporting polysubstance dependence ( alcohol , cocaine, methamphetamine) , depression, suicidal ideations of overdosing.   Today patient presents alert, attentive, polite on approach although vaguely irritable, focused on concerns about not having certain toiletries such as hair-conditioner.  She endorses feeling depressed although acknowledges improvement compared to how she felt on admission.  Describes, as above feeling "jittery" and irritable.  Currently she is not presenting with significant distal tremors or diaphoresis and does not appear to be restless or agitated.  Her vitals are currently stable (BP 116/73, pulse 68). At this time denies suicidal ideations and contracts for safety on unit. She is currently on Tegretol/Lexapro.  Tolerating these medications well. 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 Diagnosis: Alcohol/Cocaine/Methamphetamine Use Disorders, Substance Induced Mood Disorder versus MDD  Total Time spent with patient: 20 minutes  Past Psychiatric History:   Past Medical History:  Past Medical History:  Diagnosis Date  . Anxiety   . Bipolar 1 disorder ([place])   . Cocaine use   . ETOH abuse   . Hepatitis C   . Heroin abuse ([place])   . MRSA (methicillin resistant Staphylococcus aureus)   . Suicide and self-inflicted injury ([place])   . Suicide ideation   . Tobacco abuse     Past Surgical History:  Procedure Laterality Date  . arm    .  CHOLECYSTECTOMY    . EYE SURGERY    . TUBAL LIGATION     Family History: History reviewed. No pertinent family history. Family Psychiatric  History:  Social History:  Social History   Substance and Sexual Activity  Alcohol Use Yes   Comment: last night.  drinks every day      Social History   Substance and Sexual Activity  Drug Use Yes  . Types: Cocaine, Marijuana, Methamphetamines, "Crack" cocaine, Benzodiazepines   Comment: used meth sun or mon, has been on a cocaine and alcohol binge    Social History   Socioeconomic History  . Marital status: Divorced    Spouse name: Not on file  . Number of children: Not on file  . Years of education: Not on file  . Highest education level: Not on file  Occupational History  . Not on file  Social Needs  . Financial resource strain: Not on file  . Food insecurity    Worry: Not on file    Inability: Not on file  . Transportation needs    Medical: Not on file    Non-medical: Not on file  Tobacco Use  . Smoking status: Current Every Day Smoker    Packs/day: 1.00    Types: Cigarettes  . Smokeless tobacco: Never Used  Substance and Sexual Activity  . Alcohol use: Yes    Comment: last night.  drinks every day   . Drug use: Yes    Types: Cocaine, Marijuana, Methamphetamines, "Crack" cocaine, Benzodiazepines    Comment: used meth sun or mon, has been on a cocaine and alcohol binge  . Sexual activity: Yes    Birth control/protection: Surgical  Lifestyle  . Physical activity  Days per week: Not on file    Minutes per session: Not on file  . Stress: Not on file  Relationships  . Social Herbalist on phone: Not on file    Gets together: Not on file    Attends religious service: Not on file    Active member of club or organization: Not on file    Attends meetings of clubs or organizations: Not on file    Relationship status: Not on file  Other Topics Concern  . Not on file  Social History Narrative  . Not on file    Additional Social History:   Sleep: Fair  Appetite:  Fair  Current Medications: Current Facility-Administered Medications  Medication Dose Route Frequency Provider Last Rate Last Dose  . acetaminophen (TYLENOL) tablet 650 mg  650 mg Oral Q6H PRN Rankin, Shuvon B, NP   650 mg at 12/09/18 1003  . carbamazepine (TEGRETOL) chewable tablet 100 mg  100 mg Oral TID Evalene Vath A, MD      . chlordiazePOXIDE (LIBRIUM) capsule 25 mg  25 mg Oral Q6H PRN Kanija Remmel, Myer Peer, MD   25 mg at 12/09/18 0950  . escitalopram (LEXAPRO) tablet 5 mg  5 mg Oral Daily Antonie Borjon, Myer Peer, MD   5 mg at 12/09/18 5852  . hydrOXYzine (ATARAX/VISTARIL) tablet 25 mg  25 mg Oral Q6H PRN Yomara Toothman, Myer Peer, MD      . multivitamin with minerals tablet 1 tablet  1 tablet Oral Daily Harriette Tovey, Myer Peer, MD   1 tablet at 12/09/18 0949  . nicotine (NICODERM CQ - dosed in mg/24 hours) patch 21 mg  21 mg Transdermal Daily Merrillyn Ackerley, Myer Peer, MD   21 mg at 12/09/18 1009  . ondansetron (ZOFRAN-ODT) disintegrating tablet 4 mg  4 mg Oral Q8H PRN Julaine Zimny, Myer Peer, MD      . thiamine (B-1) injection 100 mg  100 mg Intramuscular Once Md Smola, Myer Peer, MD      . thiamine (VITAMIN B-1) tablet 100 mg  100 mg Oral Daily Pascha Fogal, Myer Peer, MD   100 mg at 12/09/18 1009    Lab Results:  Results for orders placed or performed during the hospital encounter of 12/08/18 (from the past 48 hour(s))  Comprehensive metabolic panel     Status: Abnormal   Collection Time: 12/08/18 12:48 AM  Result Value Ref Range   Sodium 138 135 - 145 mmol/L   Potassium 3.5 3.5 - 5.1 mmol/L   Chloride 104 98 - 111 mmol/L   CO2 25 22 - 32 mmol/L   Glucose, Bld 103 (H) 70 - 99 mg/dL   BUN 11 6 - 20 mg/dL   Creatinine, Ser 0.85 0.44 - 1.00 mg/dL   Calcium 8.7 (L) 8.9 - 10.3 mg/dL   Total Protein 7.1 6.5 - 8.1 g/dL   Albumin 3.8 3.5 - 5.0 g/dL   AST 31 15 - 41 U/L   ALT 36 0 - 44 U/L   Alkaline Phosphatase 60 38 - 126 U/L   Total Bilirubin 0.2 (L) 0.3 - 1.2  mg/dL   GFR calc non Af Amer >60 >60 mL/min   GFR calc Af Amer >60 >60 mL/min   Anion gap 9 5 - 15    Comment: Performed at [facility], [place] [address] [place], [place] [address]  Ethanol     Status: Abnormal   Collection Time: 12/08/18 12:48 AM  Result Value Ref Range   Alcohol, Ethyl (B)  80 (H) <10 mg/dL    Comment: (NOTE) Lowest detectable limit for serum alcohol is 10 mg/dL. For medical purposes only. Performed at [facility], [place] [address], [place], [place] [address]   Salicylate level     Status: None   Collection Time: 12/08/18 12:48 AM  Result Value Ref Range   Salicylate Lvl <9.4 2.8 - 30.0 mg/dL    Comment: Performed at [facility], [place] [address] [place], [place] [address]  Acetaminophen level     Status: Abnormal   Collection Time: 12/08/18 12:48 AM  Result Value Ref Range   Acetaminophen (Tylenol), Serum <10 (L) 10 - 30 ug/mL    Comment: (NOTE) Therapeutic concentrations vary significantly. A range of 10-30 ug/mL  may be an effective concentration for many patients. However, some  are best treated at concentrations outside of this range. Acetaminophen concentrations >150 ug/mL at 4 hours after ingestion  and >50 ug/mL at 12 hours after ingestion are often associated with  toxic reactions. Performed at [facility], [place] [address] [place], [place] [address]   cbc     Status: Abnormal   Collection Time: 12/08/18 12:48 AM  Result Value Ref Range   WBC 12.9 (H) 4.0 - 10.5 K/uL   RBC 4.85 3.87 - 5.11 MIL/uL   Hemoglobin 13.6 12.0 - 15.0 g/dL   HCT 44.6 36.0 - 46.0 %   MCV 92.0 80.0 - 100.0 fL   MCH 28.0 26.0 - 34.0 pg   MCHC 30.5 30.0 - 36.0 g/dL   RDW 13.6 11.5 - 15.5 %   Platelets 368 150 - 400 K/uL   nRBC 0.0 0.0 - 0.2 %    Comment: Performed at [facility], [place] [address], [place], [place] [address]  Rapid urine drug screen (hospital performed)     Status:  Abnormal   Collection Time: 12/08/18 12:48 AM  Result Value Ref Range   Opiates NONE DETECTED NONE DETECTED   Cocaine POSITIVE (A) NONE DETECTED   Benzodiazepines POSITIVE (A) NONE DETECTED   Amphetamines POSITIVE (A) NONE DETECTED   Tetrahydrocannabinol NONE DETECTED NONE DETECTED   Barbiturates NONE DETECTED NONE DETECTED    Comment: (NOTE) DRUG SCREEN FOR MEDICAL PURPOSES ONLY.  IF CONFIRMATION IS NEEDED FOR ANY PURPOSE, NOTIFY LAB WITHIN 5 DAYS. LOWEST DETECTABLE LIMITS FOR URINE DRUG SCREEN Drug Class                     Cutoff (ng/mL) Amphetamine and metabolites    1000 Barbiturate and metabolites    200 Benzodiazepine                 275 Tricyclics and metabolites     300 Opiates and metabolites        300 Cocaine and metabolites        300 THC                            50 Performed at [facility], [place] [address] [place],  [address]   I-Stat beta hCG blood, ED     Status: None   Collection Time: 12/08/18 12:57 AM  Result Value Ref Range   I-stat hCG, quantitative <5.0 <5 mIU/mL   Comment 3            Comment:   GEST. AGE      CONC.  (mIU/mL)   <=1 WEEK  5 - 50     2 WEEKS       50 - 500     3 WEEKS       100 - 10,000     4 WEEKS     1,000 - 30,000        FEMALE AND NON-PREGNANT FEMALE:     LESS THAN 5 mIU/mL   SARS Coronavirus 2 [facility] order, Performed in [facility] hospital lab) Nasopharyngeal Nasopharyngeal Swab     Status: None   Collection Time: 12/08/18  9:03 AM   Specimen: Nasopharyngeal Swab  Result Value Ref Range   SARS Coronavirus 2 NEGATIVE NEGATIVE    Comment: (NOTE) If result is NEGATIVE SARS-CoV-2 target nucleic acids are NOT DETECTED. The SARS-CoV-2 RNA is generally detectable in upper and lower  respiratory specimens during the acute phase of infection. The lowest  concentration of SARS-CoV-2 viral copies this assay can detect is 250  copies / mL. A negative result does not preclude SARS-CoV-2 infection   and should not be used as the sole basis for treatment or other  patient management decisions.  A negative result may occur with  improper specimen collection / handling, submission of specimen other  than nasopharyngeal swab, presence of viral mutation(s) within the  areas targeted by this assay, and inadequate number of viral copies  (<250 copies / mL). A negative result must be combined with clinical  observations, patient history, and epidemiological information. If result is POSITIVE SARS-CoV-2 target nucleic acids are DETECTED. The SARS-CoV-2 RNA is generally detectable in upper and lower  respiratory specimens dur ing the acute phase of infection.  Positive  results are indicative of active infection with SARS-CoV-2.  Clinical  correlation with patient history and other diagnostic information is  necessary to determine patient infection status.  Positive results do  not rule out bacterial infection or co-infection with other viruses. If result is PRESUMPTIVE POSTIVE SARS-CoV-2 nucleic acids MAY BE PRESENT.   A presumptive positive result was obtained on the submitted specimen  and confirmed on repeat testing.  While 2019 novel coronavirus  (SARS-CoV-2) nucleic acids may be present in the submitted sample  additional confirmatory testing may be necessary for epidemiological  and / or clinical management purposes  to differentiate between  SARS-CoV-2 and other Sarbecovirus currently known to infect humans.  If clinically indicated additional testing with an alternate test  methodology [phone]) is advised. The SARS-CoV-2 RNA is generally  detectable in upper and lower respiratory sp ecimens during the acute  phase of infection. The expected result is Negative. Fact Sheet for Patients:  StrictlyIdeas.no Fact Sheet for Healthcare Providers: BankingDealers.co.za This test is not yet approved or cleared by the [place] FDA  and has been authorized for detection and/or diagnosis of SARS-CoV-2 by FDA under an Emergency Use Authorization (EUA).  This EUA will remain in effect (meaning this test can be used) for the duration of the COVID-19 declaration under Section 564(b)(1) of the Act, 21 U.S.C. section 360bbb-3(b)(1), unless the authorization is terminated or revoked sooner. Performed at [facility], [place] [address] [place], [place] [address]     Blood Alcohol level:  Lab Results  Component Value Date   ETH 80 (H) 12/08/2018   ETH <10 21/97/5883    Metabolic Disorder Labs: No results found for: HGBA1C, MPG No results found for: PROLACTIN No results found for: CHOL, TRIG, HDL, CHOLHDL, VLDL, LDLCALC  Physical Findings: AIMS: Facial and Oral Movements Muscles of Facial  Expression: None, normal Lips and Perioral Area: None, normal Jaw: None, normal Tongue: None, normal,Extremity Movements Upper (arms, wrists, hands, fingers): None, normal Lower (legs, knees, ankles, toes): None, normal, Trunk Movements Neck, shoulders, hips: None, normal, Overall Severity Severity of abnormal movements (highest score from questions above): None, normal Incapacitation due to abnormal movements: None, normal Patient's awareness of abnormal movements (rate only patient's report): No Awareness, Dental Status Current problems with teeth and/or dentures?: No Does patient usually wear dentures?: No  CIWA:  CIWA-Ar Total: 6 COWS:     Musculoskeletal: Strength & Muscle Tone: within normal limits no current psychomotor agitation or restlessness, no diaphoresis Gait & Station: normal Patient leans: N/A  Psychiatric Specialty Exam: Physical Exam  ROS no chest pain, no shortness of breath, no cough, no vomiting, endorses feeling subjectively " hot", and " jittery".  No fever  Blood pressure (!) 118/56, pulse 76, temperature 98.5 F (36.9 C), temperature source Oral, resp. rate 16, height _0  (1.753  m), weight 72.6 kg, SpO2 98 %.Body mass index is 23.63 kg/m.  General Appearance: Improving grooming  Eye Contact:  Good  Speech:  Normal Rate  Volume:  Normal  Mood:  Remains vaguely depressed and dysphoric  Affect:  Labile but more reactive today, smiles at times appropriately during session  Thought Process:  Linear and Descriptions of Associations: Intact  Orientation:  Other:  Fully alert and attentive  Thought Content:  Currently denies hallucinations and does not appear internally preoccupied.  No delusions are expressed.  Suicidal Thoughts:  No denies suicidal ideations, denies self-injurious ideations, contracts for safety on unit at this time.  Homicidal Thoughts:  No  Memory:  Recent and remote grossly intact  Judgement:  Fair/improving  Insight:  Fair  Psychomotor Activity:  No tremors, no diaphoresis, no restlessness  Concentration:  Concentration: Fair and Attention Span: Fair  Recall:  Good  Fund of Knowledge:  Good  Language:  Good  Akathisia:  Negative  Handed:  Right  AIMS (if indicated):     Assets:  Desire for Improvement Resilience  ADL's:  Intact  Cognition:  WNL  Sleep:  Number of Hours: 6.75    Assessment: 76 y old female, presented to hospital reporting polysubstance dependence ( alcohol , cocaine, methamphetamine) , depression, suicidal ideations of overdosing.   Today patient is presenting with some persistent depression and dysphoria, but affect does seem more reactive than on admission.  She also presents with improved grooming and improving eye contact.  At this time denies suicidal ideations. No current psychotic symptoms are noted or endorsed.  She transitions calm with stable vital signs and no overt tremulousness or diaphoresis.  Does report subjective feelings of being" jittery".  Currently tolerating medications well. Treatment Plan Summary: Daily contact with patient to assess and evaluate symptoms and progress in treatment, Medication  management, Plan Inpatient treatment and Medications as below Encourage group and milieu participation to work on coping skills and symptom reduction Encourage efforts to work on sobriety and relapse prevention Treatment team working on disposition planning options Continue Librium 25 mg every 6 hours PRN for alcohol withdrawal as needed as per CIWA score Continue thiamine and folate supplementation  Continue Lexapro at 5 mg daily for mood disorder/anxiety Continue Tegretol at 100 mg 3 times daily for mood disorder Jenne Campus, MD 12/09/2018, 3:48 PM

## 2018-12-09 NOTE — Progress Notes (Signed)
Recreation Therapy Notes  Date:  10.2.20 Time: 0930 Location: [address] Dayroom  Group Topic: Stress Management  Goal Area(s) Addresses:  Patient will identify positive stress management techniques. Patient will identify benefits of using stress management post d/c.  Intervention: Stress Management  Activity : Progressive Muscle Relaxation.  LRT introduced the stress management technique of pmr.  LRT read a script that lead group in the process of tensing each muscle group individually and then relaxing them.  Education:  Stress Management, Discharge Planning.   Education Outcome: Acknowledges Education  Clinical Observations/Feedback:  Pt did not attend group session.    Victorino Sparrow, LRT/CTRS         Victorino Sparrow A 12/09/2018 11:55 AM

## 2018-12-09 NOTE — [facility] Suicide Risk Assessment (Signed)
[place] INPATIENT:  Family/Significant Other Suicide Prevention Education  Suicide Prevention Education:  Patient Refusal for Family/Significant Other Suicide Prevention Education: The patient Gail Castro has refused to provide written consent for family/significant other to be provided Family/Significant Other Suicide Prevention Education during admission and/or prior to discharge.  Physician notified.  Joellen Jersey 12/09/2018, 10:43 AM

## 2018-12-09 NOTE — Tx Team (Signed)
Interdisciplinary Treatment and Diagnostic Plan Update  12/09/2018 Time of Session: 9:15am Gail Castro MRN: [id number]  Principal Diagnosis: <principal problem not specified>  Secondary Diagnoses: Active Problems:   * No active hospital problems. *   Current Medications:  Current Facility-Administered Medications  Medication Dose Route Frequency Provider Last Rate Last Dose  . carbamazepine (TEGRETOL) tablet 100 mg  100 mg Oral TID Cobos, Myer Peer, MD   100 mg at 12/08/18 2056  . chlordiazePOXIDE (LIBRIUM) capsule 25 mg  25 mg Oral Q6H PRN Cobos, Myer Peer, MD      . escitalopram (LEXAPRO) tablet 5 mg  5 mg Oral Daily Cobos, Fernando A, MD      . hydrOXYzine (ATARAX/VISTARIL) tablet 25 mg  25 mg Oral Q6H PRN Cobos, Myer Peer, MD      . multivitamin with minerals tablet 1 tablet  1 tablet Oral Daily Cobos, Myer Peer, MD   1 tablet at 12/08/18 2055  . thiamine (B-1) injection 100 mg  100 mg Intramuscular Once Cobos, Fernando A, MD      . thiamine (VITAMIN B-1) tablet 100 mg  100 mg Oral Daily Cobos, Myer Peer, MD       PTA Medications: No medications prior to admission.    Patient Stressors: Financial difficulties Health problems Marital or family conflict Occupational concerns Substance abuse  Patient Strengths: Work Counselling psychologist Modalities: Medication Management, Group therapy, Case management,  1 to 1 session with clinician, Psychoeducation, Recreational therapy.   Physician Treatment Plan for Primary Diagnosis: <principal problem not specified> Long Term Goal(s): Improvement in symptoms so as ready for discharge Improvement in symptoms so as ready for discharge   Short Term Goals: Ability to identify changes in lifestyle to reduce recurrence of condition will improve Ability to identify triggers associated with substance abuse/mental health issues will improve Ability to identify changes in lifestyle to reduce recurrence of condition will improve Ability  to verbalize feelings will improve Ability to disclose and discuss suicidal ideas Ability to demonstrate self-control will improve Ability to identify and develop effective coping behaviors will improve Ability to maintain clinical measurements within normal limits will improve Compliance with prescribed medications will improve  Medication Management: Evaluate patient's response, side effects, and tolerance of medication regimen.  Therapeutic Interventions: 1 to 1 sessions, Unit Group sessions and Medication administration.  Evaluation of Outcomes: Not Met  Physician Treatment Plan for Secondary Diagnosis: Active Problems:   * No active hospital problems. *  Long Term Goal(s): Improvement in symptoms so as ready for discharge Improvement in symptoms so as ready for discharge   Short Term Goals: Ability to identify changes in lifestyle to reduce recurrence of condition will improve Ability to identify triggers associated with substance abuse/mental health issues will improve Ability to identify changes in lifestyle to reduce recurrence of condition will improve Ability to verbalize feelings will improve Ability to disclose and discuss suicidal ideas Ability to demonstrate self-control will improve Ability to identify and develop effective coping behaviors will improve Ability to maintain clinical measurements within normal limits will improve Compliance with prescribed medications will improve     Medication Management: Evaluate patient's response, side effects, and tolerance of medication regimen.  Therapeutic Interventions: 1 to 1 sessions, Unit Group sessions and Medication administration.  Evaluation of Outcomes: Not Met   RN Treatment Plan for Primary Diagnosis: <principal problem not specified> Long Term Goal(s): Knowledge of disease and therapeutic regimen to maintain health will improve  Short Term Goals: Ability to participate  in decision making will improve, Ability  to verbalize feelings will improve, Ability to disclose and discuss suicidal ideas, Ability to identify and develop effective coping behaviors will improve and Compliance with prescribed medications will improve  Medication Management: RN will administer medications as ordered by provider, will assess and evaluate patient's response and provide education to patient for prescribed medication. RN will report any adverse and/or side effects to prescribing provider.  Therapeutic Interventions: 1 on 1 counseling sessions, Psychoeducation, Medication administration, Evaluate responses to treatment, Monitor vital signs and CBGs as ordered, Perform/monitor CIWA, COWS, AIMS and Fall Risk screenings as ordered, Perform wound care treatments as ordered.  Evaluation of Outcomes: Not Met   LCSW Treatment Plan for Primary Diagnosis: <principal problem not specified> Long Term Goal(s): Safe transition to appropriate next level of care at discharge, Engage patient in therapeutic group addressing interpersonal concerns.  Short Term Goals: Engage patient in aftercare planning with referrals and resources  Therapeutic Interventions: Assess for all discharge needs, 1 to 1 time with Social worker, Explore available resources and support systems, Assess for adequacy in community support network, Educate family and significant other(s) on suicide prevention, Complete Psychosocial Assessment, Interpersonal group therapy.  Evaluation of Outcomes: Not Met   Progress in Treatment: Attending groups: No. Participating in groups: No. Taking medication as prescribed: Yes. Toleration medication: Yes. Family/Significant other contact made: No, will contact:  if patient consents to collateral contacts Patient understands diagnosis: Yes. Discussing patient identified problems/goals with staff: Yes. Medical problems stabilized or resolved: Yes. Denies suicidal/homicidal ideation: Yes. Issues/concerns per patient  self-inventory: No. Other:  New problem(s) identified: None   New Short Term/Long Term Goal(s):medication stabilization, elimination of SI thoughts, development of comprehensive mental wellness plan.    Patient Goals: "To be happier and sober"   Discharge Plan or Barriers: Patient recently admitted. CSW will continue to follow and assess for appropriate referrals and possible discharge planning.    Reason for Continuation of Hospitalization: Anxiety Depression Medication stabilization Suicidal ideation  Estimated Length of Stay: 3-5 days   Attendees: Patient: Gail Castro 12/09/2018 8:16 AM  Physician: Dr. Neita Garnet, MD 12/09/2018 8:16 AM  Nursing: Benjamine Mola.[email or web address] Downer, RN 12/09/2018 8:16 AM  RN Care Manager: 12/09/2018 8:16 AM  Social Worker: Radonna Ricker, LCSW 12/09/2018 8:16 AM  Recreational Therapist:  12/09/2018 8:16 AM  Other:  12/09/2018 8:16 AM  Other:  12/09/2018 8:16 AM  Other: 12/09/2018 8:16 AM    Scribe for Treatment Team: Marylee Floras, LCSWA 12/09/2018 8:16 AM

## 2018-12-09 NOTE — [facility] Group Notes (Signed)
LCSW Group Therapy Note 12/09/2018 2:11 PM  Type of Therapy/Topic: Group Therapy: Feelings about Diagnosis  Participation Level: Did Not Attend   Description of Group:  This group will allow patients to explore their thoughts and feelings about diagnoses they have received. Patients will be guided to explore their level of understanding and acceptance of these diagnoses. Facilitator will encourage patients to process their thoughts and feelings about the reactions of others to their diagnosis and will guide patients in identifying ways to discuss their diagnosis with significant others in their lives. This group will be process-oriented, with patients participating in exploration of their own experiences, giving and receiving support, and processing challenge from other group members.  Therapeutic Goals: 1. Patient will demonstrate understanding of diagnosis as evidenced by identifying two or more symptoms of the disorder 2. Patient will be able to express two feelings regarding the diagnosis 3. Patient will demonstrate their ability to communicate their needs through discussion and/or role play  Summary of Patient Progress:   Invited, chose not to attend.    Therapeutic Modalities:  Cognitive Behavioral Therapy Brief Therapy Feelings Identification    [place] Clinical Social Worker
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
## 2018-12-09 NOTE — Care Management (Signed)
CMA sent referral to [facility].   CMA will follow up.    CMA will notify CSW.     Kaytlen Lightsey Care Management Assistant  Email:[email or web address] Office: [phone]

## 2018-12-10 MED ORDER — TRAZODONE HCL 50 MG PO TABS
50.0000 mg | ORAL_TABLET | Freq: Every evening | ORAL | Status: DC | PRN
  Administered 2018-12-10: 21:00:00 50 mg via ORAL
  Filled 2018-12-10 (×6): qty 1

## 2018-12-10 MED ORDER — GABAPENTIN 100 MG PO CAPS
100.0000 mg | ORAL_CAPSULE | Freq: Two times a day (BID) | ORAL | Status: DC
  Administered 2018-12-10 – 2018-12-11 (×2): 100 mg via ORAL
  Filled 2018-12-10 (×6): qty 1

## 2018-12-10 MED ORDER — IBUPROFEN 200 MG PO TABS
200.0000 mg | ORAL_TABLET | ORAL | Status: DC | PRN
  Administered 2018-12-12 – 2018-12-13 (×3): 200 mg via ORAL
  Filled 2018-12-10 (×3): qty 1

## 2018-12-10 MED ORDER — ESCITALOPRAM OXALATE 10 MG PO TABS
10.0000 mg | ORAL_TABLET | Freq: Every day | ORAL | Status: DC
  Administered 2018-12-11 – 2018-12-14 (×4): 10 mg via ORAL
  Filled 2018-12-10 (×5): qty 1

## 2018-12-10 NOTE — Progress Notes (Addendum)
[facility]  12/10/2018 11:54 AM Gail HackCatrina L Castro  MRN:  [id number] Subjective:  Gail Castro reported " I am alright just waking up"  Evaluation: Gail Castro observed sitting on the side of the bed.  Gail Castro is awake, alert and oriented x3.  Denying suicidal or homicidal ideations.  Denies auditory visual hallucinations.  Reports worsening anxiety and mild cravings.  Discussed initiating gabapentin for reported symptoms.  Gail Castro reported to have a high tolerance not sure that low doses would be enough.  Discussed treatment team will continue to monitor daily.  Patient was receptive to plan.  Denies that Gail Castro has attended daily group session and was encouraged to participate within the milieu.  Patient appeared to be receptive to plan after starting her day.  Continues to report feelings of unhappiness.  Rates her depression 9 out of 10 with 10 being the worst.  Reports a good appetite.  States resting well throughout the night and throughout the day.  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 Diagnosis: Alcohol/Cocaine/Methamphetamine Use Disorders, Substance Induced Mood Disorder versus MDD  Total Time spent with patient: 20 minutes  Past Psychiatric History:   Past Medical History:  Past Medical History:  Diagnosis Date  . Anxiety   . Bipolar 1 disorder (HCC)   . Cocaine use   . ETOH abuse   . Hepatitis C   . Heroin abuse (HCC)   . MRSA (methicillin resistant Staphylococcus aureus)   . Suicide and self-inflicted injury (HCC)   . Suicide ideation   . Tobacco abuse     Past Surgical History:  Procedure Laterality Date  . arm    . CHOLECYSTECTOMY    . EYE SURGERY    . TUBAL LIGATION     Family History: History reviewed. No pertinent family history. Family Psychiatric  History:  Social History:  Social History   Substance and Sexual Activity  Alcohol Use Yes   Comment: last night.  drinks every day      Social History   Substance and Sexual Activity  Drug Use Yes  . Types: Cocaine, Marijuana, Methamphetamines, "Crack" cocaine, Benzodiazepines   Comment: used meth sun or mon, has been on a cocaine and alcohol binge    Social History   Socioeconomic History  . Marital status: Divorced    Spouse name: Not on file  . Number of children: Not on file  . Years of education: Not on file  . Highest education level: Not on file  Occupational History  . Not on file  Social Needs  . Financial resource strain: Not on file  . Food insecurity    Worry: Not on file    Inability: Not on file  . Transportation needs    Medical: Not on file    Non-medical: Not on file  Tobacco Use  . Smoking status: Current Every Day Smoker    Packs/day: 1.00    Types: Cigarettes  . Smokeless tobacco: Never Used  Substance and Sexual Activity  . Alcohol use: Yes    Comment: last night.  drinks every day   . Drug use: Yes    Types: Cocaine, Marijuana, Methamphetamines, "Crack" cocaine, Benzodiazepines    Comment: used meth sun or mon, has been on a cocaine and alcohol binge  . Sexual activity: Yes    Birth control/protection: Surgical  Lifestyle  . Physical activity    Days per week: Not on file  Minutes per session: Not on file  . Stress: Not on file  Relationships  . Social Musician on phone: Not on file    Gets together: Not on file    Attends religious service: Not on file    Active member of club or organization: Not on file    Attends meetings of clubs or organizations: Not on file    Relationship status: Not on file  Other Topics Concern  . Not on file  Social History Narrative  . Not on file   Additional Social History:   Sleep: Fair  Appetite:  Fair  Current Medications: Current Facility-Administered Medications  Medication Dose Route Frequency Provider Last Rate Last Dose  . acetaminophen (TYLENOL) tablet 650 mg  650  mg Oral Q6H PRN Rankin, Shuvon B, NP   650 mg at 12/09/18 2108  . carbamazepine (TEGRETOL) chewable tablet 100 mg  100 mg Oral TID Cobos, Rockey Situ, MD   100 mg at 12/10/18 0837  . chlordiazePOXIDE (LIBRIUM) capsule 25 mg  25 mg Oral Q6H PRN Cobos, Rockey Situ, MD   25 mg at 12/10/18 0841  . [START ON 12/11/2018] escitalopram (LEXAPRO) tablet 10 mg  10 mg Oral Daily Oneta Rack, NP      . gabapentin (NEURONTIN) capsule 100 mg  100 mg Oral BID Oneta Rack, NP      . hydrOXYzine (ATARAX/VISTARIL) tablet 25 mg  25 mg Oral Q6H PRN Cobos, Rockey Situ, MD   25 mg at 12/09/18 1547  . multivitamin with minerals tablet 1 tablet  1 tablet Oral Daily Cobos, Rockey Situ, MD   1 tablet at 12/10/18 0837  . nicotine (NICODERM CQ - dosed in mg/24 hours) patch 21 mg  21 mg Transdermal Daily Cobos, Rockey Situ, MD   21 mg at 12/10/18 0842  . ondansetron (ZOFRAN-ODT) disintegrating tablet 4 mg  4 mg Oral Q8H PRN Cobos, Rockey Situ, MD   4 mg at 12/10/18 0839  . thiamine (B-1) injection 100 mg  100 mg Intramuscular Once Cobos, Fernando A, MD      . thiamine (VITAMIN B-1) tablet 100 mg  100 mg Oral Daily Cobos, Rockey Situ, MD   100 mg at 12/10/18 0837  . traZODone (DESYREL) tablet 50 mg  50 mg Oral QHS,MR X 1 Oneta Rack, NP        Lab Results:  No results found for this or any previous visit (from the past 48 hour(s)).  Blood Alcohol level:  Lab Results  Component Value Date   ETH 80 (H) 12/08/2018   ETH <10 04/24/2018    Metabolic Disorder Labs: No results found for: HGBA1C, MPG No results found for: PROLACTIN No results found for: CHOL, TRIG, HDL, CHOLHDL, VLDL, LDLCALC  Physical Findings: AIMS: Facial and Oral Movements Muscles of Facial Expression: None, normal Lips and Perioral Area: None, normal Jaw: None, normal Tongue: None, normal,Extremity Movements Upper (arms, wrists, hands, fingers): None, normal Lower (legs, knees, ankles, toes): None, normal, Trunk Movements Neck, shoulders,  hips: None, normal, Overall Severity Severity of abnormal movements (highest score from questions above): None, normal Incapacitation due to abnormal movements: None, normal Patient's awareness of abnormal movements (rate only patient's report): No Awareness, Dental Status Current problems with teeth and/or dentures?: No Does patient usually wear dentures?: No  CIWA:  CIWA-Ar Total: 2 COWS:     Musculoskeletal: Strength & Muscle Tone: within normal limits  Gait & Station: normal Patient leans: N/A  Psychiatric  Specialty Exam: Physical Exam  Vitals reviewed. Constitutional: Gail Castro appears well-developed.  Psychiatric: Gail Castro has a normal mood and affect. Her behavior is normal.    Review of Systems  Psychiatric/Behavioral: Positive for depression and substance abuse. Negative for suicidal ideas. The patient is nervous/anxious.   All other systems reviewed and are negative.   Blood pressure 119/88, pulse 68, temperature 98.1 F (36.7 C), temperature source Oral, resp. rate 16, height 5\' 9"  (1.753 m), weight 72.6 kg, SpO2 98 %.Body mass index is 23.63 kg/m.  General Appearance: Casual  Eye Contact:  Good  Speech:  Normal Rate  Volume:  Normal  Mood:  Anxious and Depressed  Affect:  Appropriate and Congruent  Thought Process:  Coherent and Goal Directed  Orientation:  Full (Time, Place, and Person)  Thought Content:  Hallucinations: None  Suicidal Thoughts:  No  Homicidal Thoughts:  No  Memory:  Immediate;   Fair Recent;   Fair  Judgement:  Fair  Insight:  Fair  Psychomotor Activity:  Normal  Concentration:  Concentration: Fair  Recall:  Good  Fund of Knowledge:  Good  Language:  Good  Akathisia:  Negative  Handed:  Right  AIMS (if indicated):     Assets:  Desire for Improvement Resilience  ADL's:  Intact  Cognition:  WNL  Sleep:  Number of Hours: 4.5    Treatment Plan Summary: Daily contact with patient to assess and evaluate symptoms and progress in treatment and  Medication management   Continue with current treatment plan on 12/10/2018 as listed below except were noted   Continue Librium 25 mg every 6 hours PRN for alcohol withdrawal as needed as per CIWA score Continue thiamine and folate supplementation  Increased Lexapro at 5 mg to 10mg  daily for mood disorder/anxiety Continue Tegretol at 100 mg 3 times daily for mood disorder Initiated gabapentin 100 mg p.o. twice daily Continue trazodone 50 mg p.o. nightly as needed  Encourage group and milieu participation to work on Radiographer, therapeutic and symptom reduction Encourage efforts to work on sobriety and relapse prevention Treatment team working on disposition planning options  Derrill [place], NP 12/10/2018, 11:54 AM Attest to NP progress note

## 2018-12-10 NOTE — Progress Notes (Signed)
D. Pt is friendly upon approach- reports moderate withdrawal symptoms- visible in the milieu interacting appropriately with peers.Pt currently denies SI/HI and AVH A. Labs and vitals monitored. Pt compliant with medications. Pt supported emotionally and encouraged to express concerns and ask questions.   R. Pt remains safe with 15 minute checks. Will continue POC.

## 2018-12-10 NOTE — Progress Notes (Signed)

## 2018-12-10 NOTE — Progress Notes (Signed)
[facility] Group Notes:  (Nursing/MHT/Case Management/Adjunct)  Date:  12/10/2018  Time:  1400  Type of Therapy:  Nurse Education  Participation Level:  Did Not Attend   

## 2018-12-10 NOTE — [facility] Group Notes (Signed)
LCSW Group Therapy Note  12/10/2018    10:00-11:00am   Type of Therapy and Topic:  Group Therapy: Shame and Its Impact   Participation Level:  Did Not Attend   Description of Group:   In this group, patients shared and discussed that guilt is the negative feeling we have when we've done something wrong, while shame is the negative feeling we have simply about "being."  In listening to each other share, patients learned that humans are all imperfect and that there is no shame in this.  We discussed how it could positively impact our wellbeing by accepting our faults as part of our being that can be worked on but does not have to shame us.    Therapeutic Goals: 1. Patients will learn the difference between guilt and shame. 2. Patients will share their current shame feelings and how this has impacted their current lives. 3. Patients will explore possible ways to think differently about those parts of their bodies, feelings, and lives about which they do have shame. 4. Patients will learn that shame is universal, and that keeping our shame a secret actually increases its hold on us.  Summary of Patient Progress:  N/A  Therapeutic Modalities:   Cognitive Behavioral Therapy Motivation Interviewing  Kyree Fedorko J Grossman-Orr  .  

## 2018-12-10 NOTE — Progress Notes (Signed)
Patient has been up and active on the unit. She attended group this evening. She requested medication for sleep which she received. Safety maintained on unit with 15 min checks.

## 2018-12-11 DIAGNOSIS — F1024 Alcohol dependence with alcohol-induced mood disorder: Secondary | ICD-10-CM | POA: Diagnosis not present

## 2018-12-11 DIAGNOSIS — R450 Nervousness: Secondary | ICD-10-CM | POA: Diagnosis not present

## 2018-12-11 DIAGNOSIS — F1424 Cocaine dependence with cocaine-induced mood disorder: Secondary | ICD-10-CM | POA: Diagnosis not present

## 2018-12-11 MED ORDER — CHLORDIAZEPOXIDE HCL 25 MG PO CAPS
25.0000 mg | ORAL_CAPSULE | Freq: Four times a day (QID) | ORAL | Status: DC | PRN
  Administered 2018-12-11 – 2018-12-12 (×2): 25 mg via ORAL
  Filled 2018-12-11 (×2): qty 1

## 2018-12-11 MED ORDER — TRAZODONE HCL 50 MG PO TABS
50.0000 mg | ORAL_TABLET | Freq: Every evening | ORAL | Status: DC | PRN
  Administered 2018-12-11: 21:00:00 50 mg via ORAL
  Filled 2018-12-11: qty 1

## 2018-12-11 MED ORDER — GABAPENTIN 100 MG PO CAPS
100.0000 mg | ORAL_CAPSULE | Freq: Three times a day (TID) | ORAL | Status: DC
  Administered 2018-12-11 – 2018-12-12 (×3): 100 mg via ORAL
  Filled 2018-12-11 (×6): qty 1

## 2018-12-11 MED ORDER — MAGNESIUM HYDROXIDE 400 MG/5ML PO SUSP
30.0000 mL | Freq: Every day | ORAL | Status: DC | PRN
  Administered 2018-12-11: 30 mL via ORAL

## 2018-12-11 NOTE — [facility] Group Notes (Signed)
[facility] LCSW Group Therapy Note  12/11/2018  10:00-11:00AM  Type of Therapy and Topic:  Group Therapy:  Adding Supports Including Yourself  Participation Level:  Active   Description of Group:  Patients in this group were introduced to the concept that additional supports including self-support are an essential part of recovery.  Patients listed what supports they believe they need to add to their lives to achieve their goals at discharge, and they listed such things as therapist, family, doctor, support groups, and more.   A song entitled "My Own Hero" was played and a group discussion ensued in which patients stated they could relate to the song and it inspired them to realize they have be willing to help themselves in order to succeed, because other people cannot achieve their goals such as sobriety or stability for them.  A song was played called "I Am Enough" which led to a discussion about being willing to believe we are worth the effort of being a self-support.   Therapeutic Goals: 1)  demonstrate the importance of being a key part of one's own support system 2)  discuss various available supports 3)  encourage patient to use music as part of their self-support and focus on goals 4)  elicit ideas from patients about supports that need to be added   Summary of Patient Progress:  The patient only joined group for the last half and therefore did not talk about her healthy versus unhealthy supports.  However, once she arrived and listened to the songs, she cried and was forthright about her tendency to be an unhealthy support for herself.   Therapeutic Modalities:   Motivational Interviewing Activity  Maretta Los

## 2018-12-11 NOTE — Progress Notes (Signed)
[place] NOVEL CORONAVIRUS (COVID-19) DAILY CHECK-OFF SYMPTOMS - answer yes or no to each - every day NO YES  Have you had a fever in the past 24 hours?  . Fever (Temp > 37.80C / 100F) X   Have you had any of these symptoms in the past 24 hours? . New Cough .  Sore Throat  .  Shortness of Breath .  Difficulty Breathing .  Unexplained Body Aches   X   Have you had any one of these symptoms in the past 24 hours not related to allergies?   . Runny Nose .  Nasal Congestion .  Sneezing   X   If you have had runny nose, nasal congestion, sneezing in the past 24 hours, has it worsened?  X   EXPOSURES - check yes or no X   Have you traveled outside the state in the past 14 days?  X   Have you been in contact with someone with a confirmed diagnosis of COVID-19 or PUI in the past 14 days without wearing appropriate PPE?  X   Have you been living in the same home as a person with confirmed diagnosis of COVID-19 or a PUI (household contact)?    X   Have you been diagnosed with COVID-19?    X              What to do next: Answered NO to all: Answered YES to anything:   Proceed with unit schedule Follow the [facility] Inpatient Flowsheet.   

## 2018-12-11 NOTE — Progress Notes (Addendum)
D. Pt presents as very groggy this am at med window-  reports that she "isn't done sleeping yet". Per pt's self inventory, pt rates her depression, hopelessness and anxiety an 8/9/7, respectively.  Pt currently denies SI/HI and AVH   A. Labs and vitals monitored. Pt compliant with medications. Pt supported emotionally and encouraged to express concerns and ask questions.   R. Pt remains safe with 15 minute checks. Will continue POC.

## 2018-12-11 NOTE — Progress Notes (Signed)
D.   Pt has been very tearful this evening, and very labile.  Pt stated on group questionaire that she wanted to hurt herself now.  When spoken to about this Pt states "well yeah, that's why I'm here".  Pt does contract for safety on the unit.  Pt requested to get her bible out of her locker and staff did this for her.  Pt also requested several other items that were not appropriate for the unit and appeared upset that she could not have these.  She verbalized understanding to staff member, but was then overheard saying that staff were "playing favorites".  Items were high heeled boots with strings, and two pairs of shorts that were inappropriate for unit. Charge RN questioned patient about her lability tonight and patient admitted to an upsetting call with boyfriend.  Apparently he went out tonight and patient states that he never wants to go out with her.  This made patient more anxious for discharge.  Pt was able to take her scheduled and prn medication and go to her room.  A.  Support and encouragement offered, medication given as ordered  R.   Pt remains safe on the unit, will continue to monitor.

## 2018-12-11 NOTE — Progress Notes (Signed)
[facility] MD Progress Note  12/11/2018 1:02 PM Gail Castro  MRN:  [id number] Subjective: Patient reports that symptoms of withdrawal have abated/improved.  Currently denies suicidal ideations.  Denies medication side effects.  She currently presents future oriented and is interested in going to a rehab at discharge.  Objective : I have reviewed chart notes and have met with patient. 38 y old female, presented to hospital reporting polysubstance dependence ( alcohol , cocaine, methamphetamine) , depression, suicidal ideations of overdosing.   Patient is describing improving symptoms of withdrawal and currently does not appear to be in any acute distress or discomfort.  Her vitals are currently stable.  BP 116/81, pulse 70.  No significant tremors or diaphoresis. At this time denies suicidal ideations, presents future oriented and hoping to go to rehab after discharge. Staff has noted that patient appears sleepy and "groggy" in the a.m.  Patient explains "I am not a morning person".  Currently appears alert and attentive. Staff reports that patient has remained labile and that yesterday presented tearful at times. Patient attributes this to relationship stressors.  Today her affect seems more stable and reactive. Currently denies medication side effects.  She is on Tegretol, Lexapro, and Neurontin, which was added yesterday for anxiety.  Denies side effects thus far.  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 Diagnosis: Alcohol/Cocaine/Methamphetamine Use Disorders, Substance Induced Mood Disorder versus MDD  Total Time spent with patient: 15 minutes   Past Psychiatric History:   Past Medical History:  Past Medical History:  Diagnosis Date  . Anxiety   . Bipolar 1 disorder (HCC)   . Cocaine use   . ETOH abuse   . Hepatitis C   . Heroin abuse (HCC)   . MRSA (methicillin resistant Staphylococcus aureus)   . Suicide and  self-inflicted injury (HCC)   . Suicide ideation   . Tobacco abuse     Past Surgical History:  Procedure Laterality Date  . arm    . CHOLECYSTECTOMY    . EYE SURGERY    . TUBAL LIGATION     Family History: History reviewed. No pertinent family history. Family Psychiatric  History:  Social History:  Social History   Substance and Sexual Activity  Alcohol Use Yes   Comment: last night.  drinks every day      Social History   Substance and Sexual Activity  Drug Use Yes  . Types: Cocaine, Marijuana, Methamphetamines, "Crack" cocaine, Benzodiazepines   Comment: used meth sun or mon, has been on a cocaine and alcohol binge    Social History   Socioeconomic History  . Marital status: Divorced    Spouse name: Not on file  . Number of children: Not on file  . Years of education: Not on file  . Highest education level: Not on file  Occupational History  . Not on file  Social Needs  . Financial resource strain: Not on file  . Food insecurity    Worry: Not on file    Inability: Not on file  . Transportation needs    Medical: Not on file    Non-medical: Not on file  Tobacco Use  . Smoking status: Current Every Day Smoker    Packs/day: 1.00    Types: Cigarettes  . Smokeless tobacco: Never Used  Substance and Sexual Activity  . Alcohol use: Yes    Comment: last night.  drinks every day   . Drug use: Yes    Types: Cocaine, Marijuana, Methamphetamines, "  Crack" cocaine, Benzodiazepines    Comment: used meth sun or mon, has been on a cocaine and alcohol binge  . Sexual activity: Yes    Birth control/protection: Surgical  Lifestyle  . Physical activity    Days per week: Not on file    Minutes per session: Not on file  . Stress: Not on file  Relationships  . Social Herbalist on phone: Not on file    Gets together: Not on file    Attends religious service: Not on file    Active member of club or organization: Not on file    Attends meetings of clubs or  organizations: Not on file    Relationship status: Not on file  Other Topics Concern  . Not on file  Social History Narrative  . Not on file   Additional Social History:   Sleep: Fair /improving Appetite:  Fair/improving  Current Medications: Current Facility-Administered Medications  Medication Dose Route Frequency Provider Last Rate Last Dose  . acetaminophen (TYLENOL) tablet 650 mg  650 mg Oral Q6H PRN Rankin, Shuvon B, NP   650 mg at 12/09/18 2108  . carbamazepine (TEGRETOL) chewable tablet 100 mg  100 mg Oral TID Cobos, Myer Peer, MD   100 mg at 12/11/18 1208  . chlordiazePOXIDE (LIBRIUM) capsule 25 mg  25 mg Oral Q6H PRN Cobos, Myer Peer, MD   25 mg at 12/10/18 2031  . escitalopram (LEXAPRO) tablet 10 mg  10 mg Oral Daily Derrill Center, NP   10 mg at 12/11/18 0825  . gabapentin (NEURONTIN) capsule 100 mg  100 mg Oral TID Cobos, Myer Peer, MD   100 mg at 12/11/18 1208  . hydrOXYzine (ATARAX/VISTARIL) tablet 25 mg  25 mg Oral Q6H PRN Cobos, Myer Peer, MD   25 mg at 12/11/18 1208  . ibuprofen (ADVIL) tablet 200 mg  200 mg Oral Q4H PRN Derrill Center, NP      . multivitamin with minerals tablet 1 tablet  1 tablet Oral Daily Cobos, Myer Peer, MD   1 tablet at 12/11/18 0825  . nicotine (NICODERM CQ - dosed in mg/24 hours) patch 21 mg  21 mg Transdermal Daily Cobos, Myer Peer, MD   21 mg at 12/11/18 1324  . ondansetron (ZOFRAN-ODT) disintegrating tablet 4 mg  4 mg Oral Q8H PRN Cobos, Myer Peer, MD   4 mg at 12/10/18 0839  . thiamine (B-1) injection 100 mg  100 mg Intramuscular Once Cobos, Fernando A, MD      . thiamine (VITAMIN B-1) tablet 100 mg  100 mg Oral Daily Cobos, Myer Peer, MD   100 mg at 12/11/18 0825  . traZODone (DESYREL) tablet 50 mg  50 mg Oral QHS,MR X 1 Derrill Center, NP   50 mg at 12/10/18 2106    Lab Results:  No results found for this or any previous visit (from the past 48 hour(s)).  Blood Alcohol level:  Lab Results  Component Value Date   ETH 80  (H) 12/08/2018   ETH <10 40/12/2723    Metabolic Disorder Labs: No results found for: HGBA1C, MPG No results found for: PROLACTIN No results found for: CHOL, TRIG, HDL, CHOLHDL, VLDL, LDLCALC  Physical Findings: AIMS: Facial and Oral Movements Muscles of Facial Expression: None, normal Lips and Perioral Area: None, normal Jaw: None, normal Tongue: None, normal,Extremity Movements Upper (arms, wrists, hands, fingers): None, normal Lower (legs, knees, ankles, toes): None, normal, Trunk Movements Neck, shoulders, hips: None,  normal, Overall Severity Severity of abnormal movements (highest score from questions above): None, normal Incapacitation due to abnormal movements: None, normal Patient's awareness of abnormal movements (rate only patient's report): No Awareness, Dental Status Current problems with teeth and/or dentures?: No Does patient usually wear dentures?: No  CIWA:  CIWA-Ar Total: 4 COWS:     Musculoskeletal: Strength & Muscle Tone: within normal limits currently does not appear tremulous or diaphoretic.  No significant psychomotor agitation or restlessness.  Vitals are currently stable. Gait & Station: normal Patient leans: N/A  Psychiatric Specialty Exam: Physical Exam  Vitals reviewed. Constitutional: She appears well-developed.  Psychiatric: She has a normal mood and affect. Her behavior is normal.    Review of Systems  Psychiatric/Behavioral: Positive for depression and substance abuse. Negative for suicidal ideas. The patient is nervous/anxious.   All other systems reviewed and are negative. Reports frequent headaches, no chest pain, no shortness of breath, no cough, no vomiting, no fever or chills  Blood pressure 116/81, pulse 70, temperature 97.8 F (36.6 C), temperature source Oral, resp. rate 16, height 5' 9" (1.753 m), weight 72.6 kg, SpO2 98 %.Body mass index is 23.63 kg/m.  General Appearance: Casual  Eye Contact:  Good  Speech:  Normal Rate   Volume:  Normal  Mood:  Gradually improving mood  Affect:  Today more reactive, smiles at times appropriately  Thought Process:  Linear and Descriptions of Associations: Intact  Orientation:  Other:  Fully alert and attentive  Thought Content:  Denies hallucinations, no delusions are expressed  Suicidal Thoughts:  No currently denies suicidal ideations or self-injurious ideations and contracts for safety on unit.  Presents future oriented.  Homicidal Thoughts:  No denies homicidal ideations  Memory:  Recent and remote grossly intact  Judgement:  Fair/improving  Insight:  Fair/improving  Psychomotor Activity:  Normal  Concentration:  Concentration: Good and Attention Span: Good  Recall:  Good  Fund of Knowledge:  Good  Language:  Good  Akathisia:  Negative  Handed:  Right  AIMS (if indicated):     Assets:  Desire for Improvement Resilience  ADL's:  Intact  Cognition:  WNL  Sleep:  Number of Hours: 6.5   Assessment: 38 y old female, presented to hospital reporting polysubstance dependence ( alcohol , cocaine, methamphetamine) , depression, suicidal ideations of overdosing.   At this time patient describes partially improved mood.  Denies suicidal ideations and presents future oriented, expressing interest in going to a rehab at discharge.  Although currently calm/reactive in affect staff has noted ongoing affective lability and episodes of tearfulness, which patient attributes mainly to relationship stressors.  She is not currently presenting with significant alcohol withdrawal symptoms and her vitals are stable.  Thus far tolerating medications well  Treatment Plan Summary: Daily contact with patient to assess and evaluate symptoms and progress in treatment and Medication management  Treatment plan reviewed as below today 10/4 Encourage efforts to work on sobriety/relapse prevention Encourage group participation Continue Librium 25 mg every 6 hours PRN for alcohol withdrawal as  needed as per CIWA score Continue thiamine and folate supplementation  Continue Lexapro  10mg daily for mood disorder/anxiety Continue Tegretol  100 mg 3 times daily for mood disorder Continue Gabapentin 100 mg three times daily for anxiety Continue Trazodone 50 mg p.o. nightly PRN as needed for insomnia Check carbamazepine serum level.  Treatment team working on disposition option, patient currently is expressing interest in going to a rehab setting  Fernando A Cobos, MD 12/11/2018,   1:02 PM   Patient ID: Britini L Castro, female   DOB: 06/24/1980, 38 y.o.   MRN: [id number]  

## 2018-12-12 DIAGNOSIS — F1424 Cocaine dependence with cocaine-induced mood disorder: Secondary | ICD-10-CM | POA: Diagnosis not present

## 2018-12-12 DIAGNOSIS — R450 Nervousness: Secondary | ICD-10-CM | POA: Diagnosis not present

## 2018-12-12 DIAGNOSIS — F1024 Alcohol dependence with alcohol-induced mood disorder: Secondary | ICD-10-CM | POA: Diagnosis not present

## 2018-12-12 DIAGNOSIS — F332 Major depressive disorder, recurrent severe without psychotic features: Secondary | ICD-10-CM | POA: Diagnosis present

## 2018-12-12 LAB — CARBAMAZEPINE LEVEL, TOTAL: Carbamazepine Lvl: 6.9 ug/mL (ref 4.0–12.0)

## 2018-12-12 MED ORDER — MAGNESIUM CITRATE PO SOLN
1.0000 | Freq: Once | ORAL | Status: AC
  Administered 2018-12-12: 1 via ORAL

## 2018-12-12 MED ORDER — QUETIAPINE FUMARATE 50 MG PO TABS
50.0000 mg | ORAL_TABLET | Freq: Every day | ORAL | Status: DC
  Administered 2018-12-12: 50 mg via ORAL
  Filled 2018-12-12 (×3): qty 1

## 2018-12-12 MED ORDER — PANTOPRAZOLE SODIUM 20 MG PO TBEC
20.0000 mg | DELAYED_RELEASE_TABLET | Freq: Every day | ORAL | Status: DC
  Administered 2018-12-13 – 2018-12-14 (×2): 20 mg via ORAL
  Filled 2018-12-12 (×4): qty 1

## 2018-12-12 MED ORDER — GABAPENTIN 300 MG PO CAPS
300.0000 mg | ORAL_CAPSULE | Freq: Three times a day (TID) | ORAL | Status: DC
  Administered 2018-12-12 – 2018-12-14 (×7): 300 mg via ORAL
  Filled 2018-12-12 (×9): qty 1

## 2018-12-12 MED ORDER — ACAMPROSATE CALCIUM 333 MG PO TBEC
666.0000 mg | DELAYED_RELEASE_TABLET | Freq: Three times a day (TID) | ORAL | Status: DC
  Administered 2018-12-12 – 2018-12-14 (×7): 666 mg via ORAL
  Filled 2018-12-12 (×11): qty 2

## 2018-12-12 NOTE — Progress Notes (Signed)
D. Pt extremely anxious on approach, states she is concerned about discharge.  Pt states she has to have something for her anxiety and that the Vistaril really doesn't work for panic attacks.  Pt states she has no insurance and is worried about finding a provider to prescribe necessary medication.  Pt would like someone to sit down and discuss this with her before discharge.  Pt denies SI/HI/AVH at this time.  Pt was able to facetime via iPad with "someone important" to her tonight.  A.  Support and encouragement offered, medication given as ordered  R. Pt remains safe on the unit, will continue to monitor.
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
## 2018-12-12 NOTE — Progress Notes (Signed)
Patient provided [facility] list for [facility], per patient request. Patient advised to call and schedule a phone interview.  Stephanie Acre, LCSW-A Clinical Social Worker

## 2018-12-12 NOTE — [facility] Group Notes (Signed)
LCSW Group Therapy Note 12/12/2018 2:39 PM  Type of Therapy and Topic: Group Therapy: Overcoming Obstacles  Participation Level: Minimal  Description of Group:  In this group patients will be encouraged to explore what they see as obstacles to their own wellness and recovery. They will be guided to discuss their thoughts, feelings, and behaviors related to these obstacles. The group will process together ways to cope with barriers, with attention given to specific choices patients can make. Each patient will be challenged to identify changes they are motivated to make in order to overcome their obstacles. This group will be process-oriented, with patients participating in exploration of their own experiences as well as giving and receiving support and challenge from other group members.  Therapeutic Goals: 1. Patient will identify personal and current obstacles as they relate to admission. 2. Patient will identify barriers that currently interfere with their wellness or overcoming obstacles.  3. Patient will identify feelings, thought process and behaviors related to these barriers. 4. Patient will identify two changes they are willing to make to overcome these obstacles:   Summary of Patient Progress  Jeanene join the group session during the last 5-10 minutes. She appeared to be listening to her peers share, however she did not contribute to the group's discussion.    Therapeutic Modalities:  Cognitive Behavioral Therapy Solution Focused Therapy Motivational Interviewing Relapse Prevention Therapy   Theresa Duty Clinical Social Worker

## 2018-12-12 NOTE — Progress Notes (Signed)
DAR NOTE: Patient presents with anxious affect and irritable mood.  Patient is demanding and verbally aggressive towards staff.  Denies suicidal thoughts, auditory and visual hallucinations.  Maintained on routine safety checks.  Medications given as prescribed.  Support and encouragement offered as needed.  Attended group and participated.  States goal for today is " going to rehab facility."  Patient observed socializing with peers in the dayroom.  Requested and received Motrin 200 mg for complain of headache with good effect.  Patient is safe on and off the unit.

## 2018-12-12 NOTE — Care Management (Addendum)
CMA spoke with Candy in Admissions with [facility]. Per Candy, patient's referral has not be reviewed yet. The doctor should review it today.   CMA will continue to follow up.     CMA  Also sent referral to [facility].   CMA will follow up.     Pablo Mathurin Care Management Assistant  Email:[email or web address] Office: [phone]

## 2018-12-12 NOTE — Progress Notes (Signed)
Adult Psychoeducational Group Note  Date:  12/12/2018 Time:  3:21 AM  Group Topic/Focus:  Wrap-Up Group:   The focus of this group is to help patients review their daily goal of treatment and discuss progress on daily workbooks.  Participation Level:  Active  Participation Quality:  Appropriate  Affect:  Appropriate  Cognitive:  Appropriate  Insight: Appropriate  Engagement in Group:  Engaged  Modes of Intervention:  Discussion  Additional Comments:  Pt said her day was a 32. She can not remember goal for today.  Lenice Llamas Long 12/12/2018, 3:21 AM

## 2018-12-12 NOTE — Progress Notes (Signed)
Recreation Therapy Notes  Date:  10.5.20 Time: 0930 Location: 300 Hall Dayroom  Group Topic: Stress Management  Goal Area(s) Addresses:  Patient will identify positive stress management techniques. Patient will identify benefits of using stress management post d/c.  Intervention: Stress Management  Activity : Music.  Patients were given the opportunity to request songs that were soothing, peaceful and feel good.  Songs were to be appropriate meaning no cursing, not sexually explicit or any language about drug/alcohol use.   Education:  Stress Management, Discharge Planning.   Education Outcome: Acknowledges Education  Clinical Observations/Feedback: Pt did not attend activity.     Victorino Sparrow, LRT/CTRS         Victorino Sparrow A 12/12/2018 10:43 AM

## 2018-12-12 NOTE — Progress Notes (Signed)
The patient rated her day as a 5 out of a possible 10 since she experienced multiple "ups and downs". Her goal for tomorrow is to get discharged.

## 2018-12-13 DIAGNOSIS — F1994 Other psychoactive substance use, unspecified with psychoactive substance-induced mood disorder: Secondary | ICD-10-CM

## 2018-12-13 DIAGNOSIS — F1920 Other psychoactive substance dependence, uncomplicated: Secondary | ICD-10-CM

## 2018-12-13 LAB — HEMOGLOBIN A1C
Hgb A1c MFr Bld: 5.3 % (ref 4.8–5.6)
Mean Plasma Glucose: 105.41 mg/dL

## 2018-12-13 LAB — LIPID PANEL
Cholesterol: 175 mg/dL (ref 0–200)
HDL: 54 mg/dL (ref >40)
LDL Cholesterol: 76 mg/dL (ref 0–99)
Total CHOL/HDL Ratio: 3.2 RATIO
Triglycerides: 227 mg/dL — ABNORMAL HIGH (ref <150)
VLDL: 45 mg/dL — ABNORMAL HIGH (ref 0–40)

## 2018-12-13 LAB — TSH: TSH: 0.798 u[IU]/mL (ref 0.350–4.500)

## 2018-12-13 MED ORDER — ACYCLOVIR 400 MG PO TABS
400.0000 mg | ORAL_TABLET | Freq: Two times a day (BID) | ORAL | Status: DC
  Administered 2018-12-13 – 2018-12-14 (×2): 400 mg via ORAL
  Filled 2018-12-13 (×5): qty 1

## 2018-12-13 MED ORDER — QUETIAPINE FUMARATE 200 MG PO TABS
200.0000 mg | ORAL_TABLET | Freq: Every day | ORAL | Status: DC
  Administered 2018-12-13: 200 mg via ORAL
  Filled 2018-12-13 (×2): qty 1

## 2018-12-13 MED ORDER — HYDROXYZINE HCL 25 MG PO TABS
25.0000 mg | ORAL_TABLET | Freq: Four times a day (QID) | ORAL | Status: DC | PRN
  Administered 2018-12-13 – 2018-12-14 (×4): 25 mg via ORAL
  Filled 2018-12-13 (×5): qty 1

## 2018-12-13 NOTE — Progress Notes (Signed)
Spiritual care group on grief and loss facilitated by chaplain Jerene Pitch  Group Goal:  Support / Education around grief and loss Members engage in facilitated group support and psycho-social education.  Group Description:  Following introductions and group rules, group members engaged in facilitated group dialog and support around topic of loss, with particular support around experiences of loss in their lives. Group Identified types of loss (relationships / self / things) and identified patterns, circumstances, and changes that precipitate losses. Reflected on thoughts / feelings around loss, normalized grief responses, and recognized variety in grief experience. Patient Progress:  Gail Castro was in and out of group.  Did not remain in group or engage in group conversation.

## 2018-12-13 NOTE — Progress Notes (Signed)
D: Pt denies SI/HI/AV hallucinations. Pt is anxious tonight. Pt goal for today is to get better. A: Pt was offered support and encouragement. Pt was given scheduled medications. Pt was encourage to attend groups. Q 15 minute checks were done for safety.  R:Pt attends groups and interacts well with peers and staff. Pt is taking medication. Pt has no complaints.Pt receptive to treatment and safety maintained on unit.

## 2018-12-13 NOTE — Progress Notes (Signed)
[facility]  12/13/2018 10:43 AM Gail HackCatrina L Castro  MRN:  [id number] Subjective: Patient is a 38 year old female with a past psychiatric history significant for alcohol/cocaine/methamphetamine use disorder and substance-induced mood disorder who presented to the behavioral health hospital on 12/08/2018 because she had been "drinking a lot using cocaine.  Objective: Patient is seen and examined.  Patient is a 38 year old female with the above-stated past psychiatric history who is seen in follow-up.  She stated she did she does not feel well, but she is requesting discharge today.  I told her that she is probably still in the midst of withdrawal.  She was admitted on 12/08/2018.  Also after we have finished talking the patient received word from [facility] that she had been accepted for treatment there, and a bed should be available on Friday 10/9.  Her vital signs are stable, she is afebrile.  She slept 6.25 hours last night.  Her CIWA was only 2 this a.m.  Review of her laboratories on admission were essentially normal.  She had been on Tegretol and on 10/5 her level was 6.9.  Apparently that it been stopped.  Her TSH was normal but low at 0.798.  Her blood alcohol on admission was 80, her drug screen was positive for amphetamines, benzodiazepines and cocaine.  She denied suicidal or homicidal ideation.  She is more interested in trying to get out of the hospital.  Principal Problem: <principal problem not specified> Diagnosis: Active Problems:   MDD (major depressive disorder), recurrent severe, without psychosis (HCC)  Total Time spent with patient: 30 minutes  Past Psychiatric History: See admission H&P  Past Medical History:  Past Medical History:  Diagnosis Date  . Anxiety   . Bipolar 1 disorder (HCC)   . Cocaine use   . ETOH abuse   . Hepatitis C   . Heroin abuse (HCC)   . MRSA (methicillin resistant Staphylococcus aureus)   . Suicide and self-inflicted injury (HCC)   . Suicide  ideation   . Tobacco abuse     Past Surgical History:  Procedure Laterality Date  . arm    . CHOLECYSTECTOMY    . EYE SURGERY    . TUBAL LIGATION     Family History: History reviewed. No pertinent family history. Family Psychiatric  History: See admission H&P Social History:  Social History   Substance and Sexual Activity  Alcohol Use Yes   Comment: last night.  drinks every day      Social History   Substance and Sexual Activity  Drug Use Yes  . Types: Cocaine, Marijuana, Methamphetamines, "Crack" cocaine, Benzodiazepines   Comment: used meth sun or mon, has been on a cocaine and alcohol binge    Social History   Socioeconomic History  . Marital status: Divorced    Spouse name: Not on file  . Number of children: Not on file  . Years of education: Not on file  . Highest education level: Not on file  Occupational History  . Not on file  Social Needs  . Financial resource strain: Not on file  . Food insecurity    Worry: Not on file    Inability: Not on file  . Transportation needs    Medical: Not on file    Non-medical: Not on file  Tobacco Use  . Smoking status: Current Every Day Smoker    Packs/day: 1.00    Types: Cigarettes  . Smokeless tobacco: Never Used  Substance and Sexual Activity  . Alcohol  use: Yes    Comment: last night.  drinks every day   . Drug use: Yes    Types: Cocaine, Marijuana, Methamphetamines, "Crack" cocaine, Benzodiazepines    Comment: used meth sun or mon, has been on a cocaine and alcohol binge  . Sexual activity: Yes    Birth control/protection: Surgical  Lifestyle  . Physical activity    Days per week: Not on file    Minutes per session: Not on file  . Stress: Not on file  Relationships  . Social Herbalist on phone: Not on file    Gets together: Not on file    Attends religious service: Not on file    Active member of club or organization: Not on file    Attends meetings of clubs or organizations: Not on file     Relationship status: Not on file  Other Topics Concern  . Not on file  Social History Narrative  . Not on file   Additional Social History:                         Sleep: Good  Appetite:  Good  Current Medications: Current Facility-Administered Medications  Medication Dose Route Frequency Provider Last Rate Last Dose  . acamprosate (CAMPRAL) tablet 666 mg  666 mg Oral TID WC Cobos, Myer Peer, MD   666 mg at 12/13/18 0655  . acetaminophen (TYLENOL) tablet 650 mg  650 mg Oral Q6H PRN Rankin, Shuvon B, NP   650 mg at 12/09/18 2108  . escitalopram (LEXAPRO) tablet 10 mg  10 mg Oral Daily Derrill Center, NP   10 mg at 12/13/18 2671  . gabapentin (NEURONTIN) capsule 300 mg  300 mg Oral TID Cobos, Myer Peer, MD   300 mg at 12/13/18 0820  . hydrOXYzine (ATARAX/VISTARIL) tablet 25 mg  25 mg Oral Q6H PRN Sharma Covert, MD      . ibuprofen (ADVIL) tablet 200 mg  200 mg Oral Q4H PRN Derrill Center, NP   200 mg at 12/13/18 0824  . magnesium hydroxide (MILK OF MAGNESIA) suspension 30 mL  30 mL Oral Daily PRN Cobos, Myer Peer, MD   30 mL at 12/11/18 1445  . multivitamin with minerals tablet 1 tablet  1 tablet Oral Daily Cobos, Myer Peer, MD   1 tablet at 12/13/18 [phone]  . nicotine (NICODERM CQ - dosed in mg/24 hours) patch 21 mg  21 mg Transdermal Daily Cobos, Myer Peer, MD   21 mg at 12/13/18 0823  . pantoprazole (PROTONIX) EC tablet 20 mg  20 mg Oral Daily Cobos, Myer Peer, MD   20 mg at 12/13/18 0998  . QUEtiapine (SEROQUEL) tablet 200 mg  200 mg Oral QHS Sharma Covert, MD      . thiamine (VITAMIN B-1) tablet 100 mg  100 mg Oral Daily Cobos, Myer Peer, MD   100 mg at 12/13/18 3382    Lab Results:  Results for orders placed or performed during the hospital encounter of 12/08/18 (from the past 48 hour(s))  Carbamazepine level, total     Status: None   Collection Time: 12/12/18  6:56 AM  Result Value Ref Range   Carbamazepine Lvl 6.9 4.0 - 12.0 ug/mL    Comment:  Performed at [facility], [place] [address] [place], [place] [address]  TSH     Status: None   Collection Time: 12/13/18  6:44 AM  Result Value Ref  Range   TSH 0.798 0.350 - 4.500 uIU/mL    Comment: Performed by a 3rd Generation assay with a functional sensitivity of <=0.01 uIU/mL. Performed at [facility], [address], [place], [place] [address]   Lipid panel     Status: Abnormal   Collection Time: 12/13/18  6:44 AM  Result Value Ref Range   Cholesterol 175 0 - 200 mg/dL   Triglycerides 604 (H) <150 mg/dL   HDL 54 >54 mg/dL   Total CHOL/HDL Ratio 3.2 RATIO   VLDL 45 (H) 0 - 40 mg/dL   LDL Cholesterol 76 0 - 99 mg/dL    Comment:        Total Cholesterol/HDL:CHD Risk Coronary Heart Disease Risk Table                     Men   Women  1/2 Average Risk   3.4   3.3  Average Risk       5.0   4.4  2 X Average Risk   9.6   7.1  3 X Average Risk  23.4   11.0        Use the calculated Patient Ratio above and the CHD Risk Table to determine the patient's CHD Risk.        ATP III CLASSIFICATION (LDL):  <100     mg/dL   Optimal  098-119  mg/dL   Near or Above                    Optimal  130-159  mg/dL   Borderline  147-829  mg/dL   High  >562     mg/dL   Very High Performed at [facility], [address], [place], [place] [address]   Hemoglobin A1c     Status: None   Collection Time: 12/13/18  6:44 AM  Result Value Ref Range   Hgb A1c MFr Bld 5.3 4.8 - 5.6 %    Comment: (NOTE) Pre diabetes:          5.7%-6.4% Diabetes:              >6.4% Glycemic control for   <7.0% adults with diabetes    Mean Plasma Glucose 105.41 mg/dL    Comment: Performed at [facility], [address], [place], [place] [address]    Blood Alcohol level:  Lab Results  Component Value Date   ETH 80 (H) 12/08/2018   ETH <10 04/24/2018    Metabolic Disorder Labs: Lab Results  Component Value Date   HGBA1C 5.3 12/13/2018   MPG 105.41 12/13/2018    No results found for: PROLACTIN Lab Results  Component Value Date   CHOL 175 12/13/2018   TRIG 227 (H) 12/13/2018   HDL 54 12/13/2018   CHOLHDL 3.2 12/13/2018   VLDL 45 (H) 12/13/2018   LDLCALC 76 12/13/2018    Physical Findings: AIMS: Facial and Oral Movements Muscles of Facial Expression: None, normal Lips and Perioral Area: None, normal Jaw: None, normal Tongue: None, normal,Extremity Movements Upper (arms, wrists, hands, fingers): None, normal Lower (legs, knees, ankles, toes): None, normal, Trunk Movements Neck, shoulders, hips: None, normal, Overall Severity Severity of abnormal movements (highest score from questions above): None, normal Incapacitation due to abnormal movements: None, normal Patient's awareness of abnormal movements (rate only patient's report): No Awareness, Dental Status Current problems with teeth and/or dentures?: No Does patient usually wear dentures?: No  CIWA:  CIWA-Ar Total:  2 COWS:     Musculoskeletal: Strength & Muscle Tone: within normal limits Gait & Station: normal Patient leans: N/A  Psychiatric Specialty Exam: Physical Exam  Nursing note and vitals reviewed. Constitutional: She is oriented to person, place, and time. She appears well-developed and well-nourished.  HENT:  Head: Normocephalic and atraumatic.  Respiratory: Effort normal.  Neurological: She is alert and oriented to person, place, and time.    ROS  Blood pressure 114/81, pulse 66, temperature 97.8 F (36.6 C), temperature source Oral, resp. rate 16, height 5\' 9"  (1.753 m), weight 72.6 kg, SpO2 98 %.Body mass index is 23.63 kg/m.  General Appearance: Casual  Eye Contact:  Fair  Speech:  Normal Rate  Volume:  Normal  Mood:  Anxious  Affect:  Congruent  Thought Process:  Coherent and Descriptions of Associations: Circumstantial  Orientation:  Full (Time, Place, and Person)  Thought Content:  Logical  Suicidal Thoughts:  No  Homicidal Thoughts:  No  Memory:   Immediate;   Fair Recent;   Fair Remote;   Fair  Judgement:  Intact  Insight:  Fair  Psychomotor Activity:  Increased  Concentration:  Concentration: Fair and Attention Span: Fair  Recall:  of Knowledge:  Fair  Language:  Fair  Akathisia:  Negative  Handed:  Right  AIMS (if indicated):     Assets:  Desire for Improvement Resilience  ADL's:  Intact  Cognition:  WNL  Sleep:  Number of Hours: 6.25     Treatment Plan Summary: Daily contact with patient to assess and evaluate symptoms and progress in treatment, Medication management and Plan : Patient is seen and examined.  Patient is a 38 year old female with the above-stated past psychiatric history who is seen in follow-up.   Diagnosis: #1 polysubstance dependence, #2 polysubstance withdrawal, #3 substance-induced mood disorder.  Patient is seen in follow-up.  She continues on Campral, Lexapro, Neurontin, Seroquel.  She states she is not sleeping well at night, so I will increase her Seroquel up to 200 mg p.o. nightly.  She is not happy about the benzodiazepines being stopped, but she does not appear to be any active withdrawal at this point.  No other changes in her medications at this point.  It will depend upon what she wants to do about her bed at the residential facility. 1.  Continue Campral 666 mg p.o. 3 times daily with food for alcohol dependence. 2.  Continue Lexapro 10 mg p.o. daily for anxiety and depression. 3.  Continue gabapentin 300 mg p.o. 3 times daily for chronic pain and mood stability. 4.  Continue hydroxyzine 25 mg p.o. every 6 hours as needed anxiety. 5.  Continue Protonix 20 mg p.o. daily for gastric protection. 6.  Increase Seroquel to 200 mg p.o. nightly for mood stability and sleep. 7.  Continue thiamine 100 mg p.o. daily for nutritional supplementation. 8.  Patient has been accepted into a residential substance abuse treatment program. 9.  Disposition planning-in progress.  20,  MD 12/13/2018, 10:43 AM

## 2018-12-13 NOTE — Care Management (Signed)
CMA spoke with Candy in Admissions at [facility]. Per Freida Busman, patient has been accepted for treatment. Currently there are no beds available. Per Candy, a bed should be available on Friday 10/9.   CMA will continue to follow up.   CMA will notify CSW.    Gail Castro Care Management Assistant  Email:[email or web address] Office: [phone]

## 2018-12-13 NOTE — BHH Group Notes (Signed)
Adult Psychoeducational Group Note  Date:  12/13/2018 Time:  9:09 PM  Group Topic/Focus:  Wrap-Up Group:   The focus of this group is to help patients review their daily goal of treatment and discuss progress on daily workbooks.  Participation Level:  Active  Participation Quality:  Appropriate and Attentive  Affect:  Appropriate  Cognitive:  Alert and Appropriate  Insight: Appropriate and Good  Engagement in Group:  Engaged  Modes of Intervention:  Discussion and Education  Additional Comments:  Pt attended and participated in wrap up group this evening and rated their day a 9/10. Pt completed their goal, which was to complete discharge plan (tomorrow) and to feel better.    Cristi Loron 12/13/2018, 9:09 PM

## 2018-12-14 MED ORDER — ACYCLOVIR 200 MG PO CAPS
400.0000 mg | ORAL_CAPSULE | Freq: Two times a day (BID) | ORAL | Status: DC
  Filled 2018-12-14 (×3): qty 12

## 2018-12-14 MED ORDER — NICOTINE 21 MG/24HR TD PT24: 21.0000 mg | MEDICATED_PATCH | Freq: Every day | TRANSDERMAL | 0 refills | Status: DC

## 2018-12-14 MED ORDER — QUETIAPINE FUMARATE 300 MG PO TABS: 300.0000 mg | ORAL_TABLET | Freq: Every day | ORAL | 0 refills | Status: DC

## 2018-12-14 MED ORDER — ACYCLOVIR 200 MG PO CAPS
400.0000 mg | ORAL_CAPSULE | Freq: Two times a day (BID) | ORAL | Status: DC
  Filled 2018-12-14 (×3): qty 2

## 2018-12-14 MED ORDER — QUETIAPINE FUMARATE 300 MG PO TABS
300.0000 mg | ORAL_TABLET | Freq: Every day | ORAL | Status: DC
  Filled 2018-12-14: qty 1

## 2018-12-14 MED ORDER — PANTOPRAZOLE SODIUM 20 MG PO TBEC: 20.0000 mg | DELAYED_RELEASE_TABLET | Freq: Every day | ORAL | 0 refills | Status: DC

## 2018-12-14 MED ORDER — ACAMPROSATE CALCIUM 333 MG PO TBEC: 666.0000 mg | DELAYED_RELEASE_TABLET | Freq: Three times a day (TID) | ORAL | 0 refills | Status: DC

## 2018-12-14 MED ORDER — GABAPENTIN 300 MG PO CAPS: 300.0000 mg | ORAL_CAPSULE | Freq: Three times a day (TID) | ORAL | 0 refills | Status: DC

## 2018-12-14 MED ORDER — HYDROXYZINE HCL 25 MG PO TABS: 25.0000 mg | ORAL_TABLET | Freq: Four times a day (QID) | ORAL | 0 refills | Status: DC | PRN

## 2018-12-14 MED ORDER — ACYCLOVIR 400 MG PO TABS: 400.0000 mg | ORAL_TABLET | Freq: Two times a day (BID) | ORAL | 0 refills | Status: DC

## 2018-12-14 MED ORDER — ESCITALOPRAM OXALATE 10 MG PO TABS: 10.0000 mg | ORAL_TABLET | Freq: Every day | ORAL | 0 refills | Status: DC

## 2018-12-14 NOTE — Progress Notes (Signed)
  [facility] Adult Case Management Discharge Plan :  Will you be returning to the same living situation after discharge:  No. Patient reports she is discharging home with her best friend for "a couple of days" before she returns home.  At discharge, do you have transportation home?: Yes,  patient reports her best friend is picking her up Do you have the ability to pay for your medications: No.  Release of information consent forms completed and in the chart;  Patient's signature needed at discharge.  Patient to Follow up at: [facility].   Why: You have been accepted for treatment. Currently there is no bed available. If you wish to still seek treatment please follow up with facility on Thursday, 10/8. Please speak with Candy or Amy in Admissions. Contact information: [address] [place] [address] [phone]        [facility] Follow up on 12/19/2018.   Why: Hospital discharge appointment is Monday, 10/12 at 1:00p.  Please bring your photo ID and current medications.  Contact information: [place] [address] ph: [phone] fx: [phone]          Next level of care provider has access to [facility] and Suicide Prevention discussed: Yes,  with the patient      Has patient been referred to the Quitline?: N/A patient is not a smoker  Patient has been referred for addiction treatment: Yes  Marylee Floras, [place] 12/14/2018, 9:50 AM

## 2018-12-14 NOTE — Progress Notes (Signed)
Recreation Therapy Notes  Date:  10.7.20 Time: 1120 Location: 300 Hall Dayroom  Group Topic: Stress Management  Goal Area(s) Addresses:  Patient will identify positive stress management techniques. Patient will identify benefits of using stress management post d/c.  Behavioral Response:  Engaged  Intervention:  Stress Management  Activity :  Meditation.  LRT introduced the stress management technique of meditation.  LRT played a meditation that focused on embodying the characteristics of mountain and bringing it into meditation and everyday life.  Patients were to follow along as meditation played.  Education:  Stress Management, Discharge Planning.   Education Outcome: Acknowledges Education  Clinical Observations/Feedback:  Pt attended and participated in session.     Victorino Sparrow, LRT/CTRS    Victorino Sparrow A 12/14/2018 11:51 AM

## 2018-12-14 NOTE — Discharge Summary (Signed)
Physician Discharge Summary Note  Patient:  Gail Castro is an 38 y.o., female MRN:  [id number] DOB:  09-Nov-1980 Patient phone:  [phone] (home)  Patient address:   [address] [place] [address],  Total Time spent with patient: 15 minutes  Date of Admission:  12/08/2018 Date of Discharge: 12/14/18  Reason for Admission:  Alcohol/cocaine/methamphetamine use with suicidal ideation  Principal Problem: <principal problem not specified> Discharge Diagnoses: Active Problems:   Polysubstance dependence (HCC)   MDD (major depressive disorder), recurrent severe, without psychosis (HCC)   Substance induced mood disorder [facility])   Past Psychiatric History: Patient was admitted to [facility] in February 2020 for polysubstance dependence, depression,suicidal ideations, cutting wrists. At the time was discharged on Tegretol, Trintellix, Neurontin and was referred to [facility] after discharge, which she reports she completed successfully.  Reports past history of Bipolar Disorder diagnosis, and reports history of a postpartum depression at age 50, before drug/alcohol abuse onset . Reports history of psychosis only in relation to drug abuse or prolonged periods of not sleeping in the context of drug use. Also endorses history of PTSD symptoms stemming from past traumatic experiences .  Reports history of suicide attempts, last time " a few years ago", by overdosing on alcohol and drugs . Also , reports past history of cutting wrists   Past Medical History:  Past Medical History:  Diagnosis Date  . Anxiety   . Bipolar 1 disorder (HCC)   . Cocaine use   . ETOH abuse   . Hepatitis C   . Heroin abuse (HCC)   . MRSA (methicillin resistant Staphylococcus aureus)   . Suicide and self-inflicted injury (HCC)   . Suicide ideation   . Tobacco abuse     Past Surgical History:  Procedure Laterality Date  . arm    . CHOLECYSTECTOMY    . EYE SURGERY    . TUBAL LIGATION     Family  History: History reviewed. No pertinent family history. Family Psychiatric  History: reports there is a history of alcohol and substance abuse in extended family. A maternal uncle committed suicide Social History:  Social History   Substance and Sexual Activity  Alcohol Use Yes   Comment: last night.  drinks every day      Social History   Substance and Sexual Activity  Drug Use Yes  . Types: Cocaine, Marijuana, Methamphetamines, "Crack" cocaine, Benzodiazepines   Comment: used meth sun or mon, has been on a cocaine and alcohol binge    Social History   Socioeconomic History  . Marital status: Divorced    Spouse name: Not on file  . Number of children: Not on file  . Years of education: Not on file  . Highest education level: Not on file  Occupational History  . Not on file  Social Needs  . Financial resource strain: Not on file  . Food insecurity    Worry: Not on file    Inability: Not on file  . Transportation needs    Medical: Not on file    Non-medical: Not on file  Tobacco Use  . Smoking status: Current Every Day Smoker    Packs/day: 1.00    Types: Cigarettes  . Smokeless tobacco: Never Used  Substance and Sexual Activity  . Alcohol use: Yes    Comment: last night.  drinks every day   . Drug use: Yes    Types: Cocaine, Marijuana, Methamphetamines, "Crack" cocaine, Benzodiazepines    Comment: used meth sun  or mon, has been on a cocaine and alcohol binge  . Sexual activity: Yes    Birth control/protection: Surgical  Lifestyle  . Physical activity    Days per week: Not on file    Minutes per session: Not on file  . Stress: Not on file  Relationships  . Social Musician on phone: Not on file    Gets together: Not on file    Attends religious service: Not on file    Active member of club or organization: Not on file    Attends meetings of clubs or organizations: Not on file    Relationship status: Not on file  Other Topics Concern  . Not on file   Social History Narrative  . Not on file    [facility] Course:  From admission H&P: 38 year old female. Presented to ED earlier today at the encouragement of friend. States " I had been staying in a hotel , and have been drinking a lot and using cocaine". States she had told friends she would go to a rehab but as had not gone yet they were concerned and asked her to come to [facility], which she agreed to. Reports history of polysubstance use disorder, including alcohol, cocaine, methamphetamine. Reports she had been sober x 2 years and relapsed earlier this year. She reports she has been drinking daily, up to 24 beers a day on some days, but usually between 6-12 beers. States she drinks in part to "level out" from cocaine use (Reports daily crack cocaine use ). She states she has not used methamphetamine in several days. Patient reports she has been feeling depressed , and states " I feel my mood has gradually been getting worse". She reports recent suicidal ideations of overdosing on heroin ( which she states she does not use). Endorses neuro-vegetative symptoms as below. Endorses hallucinations intermittently which she attributes to stimulant abuse and " staying up for days". Currently does not present internally preoccupied and no psychotic symptoms are endorsed or noted. Admission BAL 80, UDS positive for amphetamines, BZDS, Cocaine.  [name] was admitted for alcohol/cocaine/methamphetamine use with suicidal ideation. UDS was positive for BZDs as well. She remained on the [facility] unit for six days.  She was noted to often be labile during hospitalization. CIWA protocol was started with Librium PRN CIWA>10. Lexapro, Neurontin, Seroquel, and Campral were started. She participated in group therapy on the unit. She responded well to treatment with no adverse effects reported. She has shown improved mood, affect, sleep, and interaction. She denies any SI/HI/AVH and contracts for safety. Denies withdrawal symptoms.  She is discharging on the medications listed below. She agrees to follow up at [facility] (see below). She is provided with prescriptions for medications upon discharge. Her friend is picking her up for discharge home.  Physical Findings: AIMS: Facial and Oral Movements Muscles of Facial Expression: None, normal Lips and Perioral Area: None, normal Jaw: None, normal Tongue: None, normal,Extremity Movements Upper (arms, wrists, hands, fingers): None, normal Lower (legs, knees, ankles, toes): None, normal, Trunk Movements Neck, shoulders, hips: None, normal, Overall Severity Severity of abnormal movements (highest score from questions above): None, normal Incapacitation due to abnormal movements: None, normal Patient's awareness of abnormal movements (rate only patient's report): No Awareness, Dental Status Current problems with teeth and/or dentures?: No Does patient usually wear dentures?: No  CIWA:  CIWA-Ar Total: 2 COWS:     Musculoskeletal: Strength & Muscle Tone: within normal limits Gait &  Station: normal Patient leans: N/A  Psychiatric Specialty Exam: Physical Exam  Nursing note and vitals reviewed. Constitutional: She is oriented to person, place, and time. She appears well-developed and well-nourished.  Cardiovascular: Normal rate.  Respiratory: Effort normal.  Neurological: She is alert and oriented to person, place, and time.    Review of Systems  Constitutional: Negative.   Respiratory: Negative for cough and shortness of breath.   Cardiovascular: Negative for chest pain.  Psychiatric/Behavioral: Positive for depression (stable on medication) and substance abuse. Negative for hallucinations and suicidal ideas. The patient is not nervous/anxious and does not have insomnia.     Blood pressure 94/83, pulse 76, temperature 97.8 F (36.6 C), resp. rate 16, height 5\' 9"  (1.753 m), weight 72.6 kg, SpO2 98 %.Body mass index is 23.63 kg/m.  See MD's discharge SRA       Has this patient used any form of tobacco in the last 30 days? (Cigarettes, Smokeless Tobacco, Cigars, and/or Pipes) Yes, a prescription for an FDA-approved medication for tobacco cessation was offered at discharge.   Blood Alcohol level:  Lab Results  Component Value Date   ETH 80 (H) 12/08/2018   ETH <10 04/24/2018    Metabolic Disorder Labs:  Lab Results  Component Value Date   HGBA1C 5.3 12/13/2018   MPG 105.41 12/13/2018   No results found for: PROLACTIN Lab Results  Component Value Date   CHOL 175 12/13/2018   TRIG 227 (H) 12/13/2018   HDL 54 12/13/2018   CHOLHDL 3.2 12/13/2018   VLDL 45 (H) 12/13/2018   LDLCALC 76 12/13/2018    See Psychiatric Specialty Exam and Suicide Risk Assessment completed by Attending Physician prior to discharge.  Discharge destination:  Home  Is patient on multiple antipsychotic therapies at discharge:  No   Has Patient had three or more failed trials of antipsychotic monotherapy by history:  No  Recommended Plan for Multiple Antipsychotic Therapies: NA  Discharge Instructions    Discharge instructions   Complete by: As directed    Patient is instructed to take all prescribed medications as recommended. Report any side effects or adverse reactions to your outpatient psychiatrist. Patient is instructed to abstain from alcohol and illegal drugs while on prescription medications. In the event of worsening symptoms, patient is instructed to call the crisis hotline, 911, or go to the nearest emergency department for evaluation and treatment.     Allergies as of 12/14/2018   No Known Allergies     Medication List    TAKE these medications     Indication  acamprosate 333 MG tablet Commonly known as: CAMPRAL Take 2 tablets (666 mg total) by mouth 3 (three) times daily with meals.  Indication: Abuse or Misuse of Alcohol   acyclovir 400 MG tablet Commonly known as: ZOVIRAX Take 1 tablet (400 mg total) by mouth 2 (two) times  daily.  Indication: Herpes   escitalopram 10 MG tablet Commonly known as: LEXAPRO Take 1 tablet (10 mg total) by mouth daily. Start taking on: December 15, 2018  Indication: Major Depressive Disorder   gabapentin 300 MG capsule Commonly known as: NEURONTIN Take 1 capsule (300 mg total) by mouth 3 (three) times daily.  Indication: Neuropathic Pain   hydrOXYzine 25 MG tablet Commonly known as: ATARAX/VISTARIL Take 1 tablet (25 mg total) by mouth every 6 (six) hours as needed for anxiety.  Indication: Feeling Anxious   nicotine 21 mg/24hr patch Commonly known as: NICODERM CQ - dosed in mg/24 hours Place 1 patch (  21 mg total) onto the skin daily. Start taking on: December 15, 2018  Indication: Nicotine Addiction   pantoprazole 20 MG tablet Commonly known as: PROTONIX Take 1 tablet (20 mg total) by mouth daily. Start taking on: December 15, 2018  Indication: Heartburn   QUEtiapine 300 MG tablet Commonly known as: SEROQUEL Take 1 tablet (300 mg total) by mouth at bedtime.  Indication: Depressive Phase of [facility] Follow up.   Why: You have been accepted for treatment. Currently there is no bed available. If you wish to still seek treatment please follow up with facility on Thursday, 10/8. Please speak with Candy or Amy in Admissions. Contact information: [address] [place] [address] [phone]        [facility] Follow up on 12/19/2018.   Why: [facility] discharge appointment is Monday, 10/12 at 1:00p.  Please bring your photo ID and current medications.  Contact information: [place] [address] ph: [phone] fx: [phone]          Follow-up recommendations: Activity as tolerated. Diet as recommended by primary care physician. Keep all scheduled follow-up appointments as recommended.   Comments:   Patient is instructed to take all  prescribed medications as recommended. Report any side effects or adverse reactions to your outpatient psychiatrist. Patient is instructed to abstain from alcohol and illegal drugs while on prescription medications. In the event of worsening symptoms, patient is instructed to call the crisis hotline, 911, or go to the nearest emergency department for evaluation and treatment.  Signed: Connye Burkitt, NP 12/14/2018, 11:23 AM

## 2018-12-14 NOTE — Progress Notes (Signed)
Patient ID: Gail [facility], female   DOB: Sep 16, 1980, 38 y.o.   MRN: [id number] Pt d/c to home with friend. D/c instructions and medications reviewed and given. Pt verbalizes understanding. All belongings returned from locker.

## 2018-12-14 NOTE — BHH Group Notes (Signed)
[facility] Group Notes:  (Nursing/MHT/Case Management/Adjunct)  Date:  12/13/2018  Time:  1:00 PM  Type of Therapy:  Nurse Education  Participation Level:  Active  Participation Quality:  Appropriate, Attentive, Monopolizing, Redirectable, Sharing and Supportive  Affect:  Anxious and Appropriate  Cognitive:  Alert and Appropriate  Insight:  Appropriate, Good and Improving  Engagement in Group:  Developing/Improving, Engaged, Improving, Monopolizing and Supportive  Modes of Intervention:  Discussion, Education, Exploration, Socialization and Support  Summary of Progress/Problems: Pt's discussed mindfulness and what this word/practice means for them. Pt's educated each other on the practice of mindfulness and how they use this as a coping skill. Pt's related mindfulness to their depression/anxiety/substance abuse. Lastly, pt's related their personal goal(s) and how using mindfulness while being mindful could aid them in reducing future crisis. Pt was very vocal and group and shared multiple times. Pt could be overly assertive with giving advice and brash with confrontation of others insight. Pt was easily redirectable and contributed to the group thoroughly.   [place] 12/14/2018, 10:02 AM

## 2018-12-14 NOTE — Progress Notes (Signed)
D: Pt denies SI/HI/AV hallucinations. Pt is anxious and upset about her medications. Patient at nurses station threatening to act out on unit if she does not get sleep without having other medications ordered. Patient upset that her Seroquel was increased and less than an hour of taking medication was at desk stating she will not be able to sleep. Night provider was made aware and no new medications ordered. Patient updated of no medication changes and did calm down. A: Pt was offered support and encouragement. Pt was given scheduled medications. Pt was encourage to attend groups. Q 15 minute checks were done for safety.  R:Pt attends group and interacts well with peers and staff. Pt is taking medication. Pt did calm down and go to bed and was observed resting with eyes closed and no distress noted. Pt receptive to treatment and safety maintained on unit.

## 2018-12-14 NOTE — BHH Suicide Risk Assessment (Signed)
[facility] Discharge Suicide Risk Assessment   Principal Problem: <principal problem not specified> Discharge Diagnoses: Active Problems:   Polysubstance dependence (HCC)   MDD (major depressive disorder), recurrent severe, without psychosis ([place])   Substance induced mood disorder ([place])   Total Time spent with patient: 20 minutes  Musculoskeletal: Strength & Muscle Tone: within normal limits Gait & Station: normal Patient leans: N/A  Psychiatric Specialty Exam: Review of Systems  All other systems reviewed and are negative.   Blood pressure 94/83, pulse 76, temperature 97.8 F (36.6 C), resp. rate 16, height 5\' 9"  (1.753 m), weight 72.6 kg, SpO2 98 %.Body mass index is 23.63 kg/m.  General Appearance: Casual  Eye Contact::  Good  Speech:  Normal Rate409  Volume:  Normal  Mood:  Anxious  Affect:  Congruent  Thought Process:  Coherent and Descriptions of Associations: Intact  Orientation:  Full (Time, Place, and Person)  Thought Content:  Logical  Suicidal Thoughts:  No  Homicidal Thoughts:  No  Memory:  Immediate;   Fair Recent;   Fair Remote;   Fair  Judgement:  Intact  Insight:  Fair  Psychomotor Activity:  Increased  Concentration:  Fair  Recall:  [employer] of Knowledge:Fair  Language: Good  Akathisia:  Negative  Handed:  Right  AIMS (if indicated):     Assets:  Desire for Improvement Resilience  Sleep:  Number of Hours: 5.75  Cognition: WNL  ADL's:  Intact   Mental Status Per Nursing Assessment::   On Admission:  Suicidal ideation indicated by patient, Intention to act on suicide plan, Suicide plan, Belief that plan would result in death, Self-harm thoughts  Demographic Factors:  Divorced or widowed, Caucasian and Living alone  Loss Factors: Financial problems/change in socioeconomic status  Historical Factors: Impulsivity  Risk Reduction Factors:   Living with another person, especially a relative and Positive social support  Continued Clinical  Symptoms:  Depression:   Comorbid alcohol abuse/dependence Impulsivity Alcohol/Substance Abuse/Dependencies  Cognitive Features That Contribute To Risk:  None    Suicide Risk:  Minimal: No identifiable suicidal ideation.  Patients presenting with no risk factors but with morbid ruminations; may be classified as minimal risk based on the severity of the depressive symptoms  Follow-up [facility] Follow up.   Specialty: Addiction Medicine Contact information: [place] [address] [phone]           Plan Of Care/Follow-up recommendations:  Activity:  ad lib  Sharma Covert, MD 12/14/2018, 8:26 AM

## 2018-12-14 NOTE — Plan of Care (Signed)
  Problem: Safety: Goal: Periods of time without injury will increase Outcome: Progressing Note: Patient denies SI   

## 2018-12-14 NOTE — Tx Team (Addendum)
Interdisciplinary Treatment and Diagnostic Plan Update  12/14/2018 Time of Session: 9:15am Gail Castro MRN: [id number]  Principal Diagnosis: <principal problem not specified>  Secondary Diagnoses: Active Problems:   Polysubstance dependence (HCC)   MDD (major depressive disorder), recurrent severe, without psychosis (HCC)   Substance induced mood disorder (HCC)   Current Medications:  Current Facility-Administered Medications  Medication Dose Route Frequency Provider Last Rate Last Dose  . acamprosate (CAMPRAL) tablet 666 mg  666 mg Oral TID WC Cobos, Rockey Situ, MD   666 mg at 12/14/18 0658  . acetaminophen (TYLENOL) tablet 650 mg  650 mg Oral Q6H PRN Rankin, Shuvon B, NP   650 mg at 12/09/18 2108  . acyclovir (ZOVIRAX) tablet 400 mg  400 mg Oral BID Aldean Baker, NP   400 mg at 12/14/18 6629  . escitalopram (LEXAPRO) tablet 10 mg  10 mg Oral Daily Oneta Rack, NP   10 mg at 12/14/18 0756  . gabapentin (NEURONTIN) capsule 300 mg  300 mg Oral TID Cobos, Rockey Situ, MD   300 mg at 12/14/18 0756  . hydrOXYzine (ATARAX/VISTARIL) tablet 25 mg  25 mg Oral Q6H PRN Antonieta Pert, MD   25 mg at 12/14/18 0755  . ibuprofen (ADVIL) tablet 200 mg  200 mg Oral Q4H PRN Oneta Rack, NP   200 mg at 12/13/18 2234  . magnesium hydroxide (MILK OF MAGNESIA) suspension 30 mL  30 mL Oral Daily PRN Cobos, Rockey Situ, MD   30 mL at 12/11/18 1445  . multivitamin with minerals tablet 1 tablet  1 tablet Oral Daily Cobos, Rockey Situ, MD   1 tablet at 12/14/18 0756  . nicotine (NICODERM CQ - dosed in mg/24 hours) patch 21 mg  21 mg Transdermal Daily Cobos, Rockey Situ, MD   21 mg at 12/14/18 0801  . pantoprazole (PROTONIX) EC tablet 20 mg  20 mg Oral Daily Cobos, Rockey Situ, MD   20 mg at 12/14/18 0755  . QUEtiapine (SEROQUEL) tablet 300 mg  300 mg Oral QHS Antonieta Pert, MD      . thiamine (VITAMIN B-1) tablet 100 mg  100 mg Oral Daily Cobos, Rockey Situ, MD   100 mg at 12/14/18 0756   PTA  Medications: No medications prior to admission.    Patient Stressors: Financial difficulties Health problems Marital or family conflict Occupational concerns Substance abuse  Patient Strengths: Work Social worker Modalities: Medication Management, Group therapy, Case management,  1 to 1 session with clinician, Psychoeducation, Recreational therapy.   Physician Treatment Plan for Primary Diagnosis: <principal problem not specified> Long Term Goal(s): Improvement in symptoms so as ready for discharge Improvement in symptoms so as ready for discharge   Short Term Goals: Ability to identify changes in lifestyle to reduce recurrence of condition will improve Ability to identify triggers associated with substance abuse/mental health issues will improve Ability to identify changes in lifestyle to reduce recurrence of condition will improve Ability to verbalize feelings will improve Ability to disclose and discuss suicidal ideas Ability to demonstrate self-control will improve Ability to identify and develop effective coping behaviors will improve Ability to maintain clinical measurements within normal limits will improve Compliance with prescribed medications will improve  Medication Management: Evaluate patient's response, side effects, and tolerance of medication regimen.  Therapeutic Interventions: 1 to 1 sessions, Unit Group sessions and Medication administration.  Evaluation of Outcomes: Adequate for Discharge  Physician Treatment Plan for Secondary Diagnosis: Active Problems:   Polysubstance dependence (HCC)  MDD (major depressive disorder), recurrent severe, without psychosis (HCC)   Substance induced mood disorder (HCC)  Long Term Goal(s): Improvement in symptoms so as ready for discharge Improvement in symptoms so as ready for discharge   Short Term Goals: Ability to identify changes in lifestyle to reduce recurrence of condition will improve Ability to identify  triggers associated with substance abuse/mental health issues will improve Ability to identify changes in lifestyle to reduce recurrence of condition will improve Ability to verbalize feelings will improve Ability to disclose and discuss suicidal ideas Ability to demonstrate self-control will improve Ability to identify and develop effective coping behaviors will improve Ability to maintain clinical measurements within normal limits will improve Compliance with prescribed medications will improve     Medication Management: Evaluate patient's response, side effects, and tolerance of medication regimen.  Therapeutic Interventions: 1 to 1 sessions, Unit Group sessions and Medication administration.  Evaluation of Outcomes: Adequate for Discharge   RN Treatment Plan for Primary Diagnosis: <principal problem not specified> Long Term Goal(s): Knowledge of disease and therapeutic regimen to maintain health will improve  Short Term Goals: Ability to participate in decision making will improve, Ability to verbalize feelings will improve, Ability to disclose and discuss suicidal ideas, Ability to identify and develop effective coping behaviors will improve and Compliance with prescribed medications will improve  Medication Management: RN will administer medications as ordered by provider, will assess and evaluate patient's response and provide education to patient for prescribed medication. RN will report any adverse and/or side effects to prescribing provider.  Therapeutic Interventions: 1 on 1 counseling sessions, Psychoeducation, Medication administration, Evaluate responses to treatment, Monitor vital signs and CBGs as ordered, Perform/monitor CIWA, COWS, AIMS and Fall Risk screenings as ordered, Perform wound care treatments as ordered.  Evaluation of Outcomes: Adequate for Discharge   LCSW Treatment Plan for Primary Diagnosis: <principal problem not specified> Long Term Goal(s): Safe  transition to appropriate next level of care at discharge, Engage patient in therapeutic group addressing interpersonal concerns.  Short Term Goals: Engage patient in aftercare planning with referrals and resources  Therapeutic Interventions: Assess for all discharge needs, 1 to 1 time with Social worker, Explore available resources and support systems, Assess for adequacy in community support network, Educate family and significant other(s) on suicide prevention, Complete Psychosocial Assessment, Interpersonal group therapy.  Evaluation of Outcomes: Adequate for Discharge   Progress in Treatment: Attending groups: No. Participating in groups: No. Taking medication as prescribed: Yes. Toleration medication: Yes. Family/Significant other contact made: No, will contact:  if patient consents to collateral contacts Patient understands diagnosis: Yes. Discussing patient identified problems/goals with staff: Yes. Medical problems stabilized or resolved: Yes. Denies suicidal/homicidal ideation: Yes. Issues/concerns per patient self-inventory: No. Other:  New problem(s) identified: None   New Short Term/Long Term Goal(s):medication stabilization, elimination of SI thoughts, development of comprehensive mental wellness plan.    Patient Goals: "To be happier and sober"   Discharge Plan or Barriers: Patient reports she is discharging home with her best friend. [facility]. She continues to be on the wait list for [facility]; Patient will continue to follow and inquire about bed availability.    Reason for Continuation of Hospitalization:  Estimated Length of Stay: discharge 12/14/18  Attendees: Patient: Janeth RaseCatrina Newman 12/14/2018 9:23 AM  Physician: Dr. Nehemiah MassedFernando Cobos, MD; Dr Jola Babinskilary  12/14/2018 9:23 AM  Nursing: Lanora ManisElizabeth.Val Eagle, RN; Marcelino DusterMichelle RN 12/14/2018 9:23 AM  RN Care Manager: 12/14/2018 9:23 AM  Social Worker: Radonna Ricker, Marlinda Mike 12/14/2018 9:23 AM  Recreational Therapist:  12/14/2018 9:23 AM  Other: Ovidio Kin 12/14/2018 9:23 AM  Other: Marcie Bal, NP 12/14/2018 9:23 AM  Other: 12/14/2018 9:23 AM    Scribe for Treatment Team: Billey Chang, Student-Social Work 12/14/2018 9:23 AM

## 2018-12-15 NOTE — [facility] Group Notes (Signed)
[facility] Group Notes:  (Nursing/MHT/Case Management/Adjunct)  Date:  12/14/2018  Time:  9:00 AM  Type of Therapy:  Nurse Education  Participation Level:  Active  Participation Quality:  Appropriate and Attentive  Affect:  Appropriate  Cognitive:  Alert and Appropriate  Insight:  Appropriate  Engagement in Group:  Developing/Improving, Engaged and Improving  Modes of Intervention:  Discussion, Education, Exploration, Socialization and Support  Summary of Progress/Problems: Pt's dicussed their personal development and how goal setting could/can help them achieve this. Pt's were educated on what SMART goal setting is, and how they can use this for short and long term goal setting. Pt's discussed their goal for the day and how they hoped to obtain this goal. If the goal wasn't SMART, then the pt was asked to formulate a new goal. Lastly, pt's identified needs being met and those needs that possibly aren't being met hindering them from self-actualization.  Pt was appropriate and shared their SMART goal for staying clean after leaving here. Pt was appropriate and on topic.  Otelia Limes Sonam Wandel 12/15/2018, 8:32 AM

## 2019-04-02 ENCOUNTER — Emergency Department (HOSPITAL_COMMUNITY)
Admission: EM | Admit: 2019-04-02 | Discharge: 2019-04-02 | Disposition: A | Discharge status: Home / Self Care | Payer: Self-pay | Attending: Emergency Medicine | Admitting: Emergency Medicine

## 2019-04-02 ENCOUNTER — Emergency Department (HOSPITAL_COMMUNITY): Payer: Self-pay

## 2019-04-02 ENCOUNTER — Encounter (HOSPITAL_COMMUNITY): Payer: Self-pay | Admitting: Emergency Medicine

## 2019-04-02 ENCOUNTER — Other Ambulatory Visit: Payer: Self-pay

## 2019-04-02 DIAGNOSIS — Z79899 Other long term (current) drug therapy: Secondary | ICD-10-CM | POA: Insufficient documentation

## 2019-04-02 DIAGNOSIS — F1990 Other psychoactive substance use, unspecified, uncomplicated: Secondary | ICD-10-CM | POA: Insufficient documentation

## 2019-04-02 DIAGNOSIS — F17210 Nicotine dependence, cigarettes, uncomplicated: Secondary | ICD-10-CM | POA: Insufficient documentation

## 2019-04-02 DIAGNOSIS — F1910 Other psychoactive substance abuse, uncomplicated: Secondary | ICD-10-CM

## 2019-04-02 LAB — CBC
HCT: 40.5 % (ref 36.0–46.0)
Hemoglobin: 13.4 g/dL (ref 12.0–15.0)
MCH: 28.9 pg (ref 26.0–34.0)
MCHC: 33.1 g/dL (ref 30.0–36.0)
MCV: 87.5 fL (ref 80.0–100.0)
Platelets: 308 10*3/uL (ref 150–400)
RBC: 4.63 MIL/uL (ref 3.87–5.11)
RDW: 14.5 % (ref 11.5–15.5)
WBC: 13.4 10*3/uL — ABNORMAL HIGH (ref 4.0–10.5)
nRBC: 0 % (ref 0.0–0.2)

## 2019-04-02 LAB — TROPONIN I (HIGH SENSITIVITY)
Troponin I (High Sensitivity): 2 ng/L (ref <18)
Troponin I (High Sensitivity): <2 ng/L (ref <18)

## 2019-04-02 LAB — BASIC METABOLIC PANEL
Anion gap: 10 (ref 5–15)
BUN: 18 mg/dL (ref 6–20)
CO2: 25 mmol/L (ref 22–32)
Calcium: 8.9 mg/dL (ref 8.9–10.3)
Chloride: 102 mmol/L (ref 98–111)
Creatinine, Ser: 0.9 mg/dL (ref 0.44–1.00)
GFR calc Af Amer: >60 mL/min (ref >60)
GFR calc non Af Amer: >60 mL/min (ref >60)
Glucose, Bld: 97 mg/dL (ref 70–99)
Potassium: 3.6 mmol/L (ref 3.5–5.1)
Sodium: 137 mmol/L (ref 135–145)

## 2019-04-02 LAB — I-STAT BETA HCG BLOOD, ED (MC, WL, AP ONLY): I-stat hCG, quantitative: <5 m[IU]/mL (ref <5)

## 2019-04-02 MED ORDER — SODIUM CHLORIDE 0.9% FLUSH
3.0000 mL | Freq: Once | INTRAVENOUS | Status: AC
  Administered 2019-04-02: 07:00:00 3 mL via INTRAVENOUS

## 2019-04-02 NOTE — ED Provider Notes (Signed)
[facility]-EMERGENCY DEPT Provider Note   CSN: [id number] Arrival date & time: 04/02/19  0548     History Chief Complaint  Patient presents with  . Chest Pain  . Addiction Problem     Gail Castro Gail Castro is a 39 y.o. female with past medical history of polysubstance abuse, hepatitis C, depression, anxiety, presenting to the emergency department with complaint of chest pain that began 30 minutes prior to arrival.  Patient's chief complaint obtained in triage, however on evaluation patient appears under the influence and is unable to provide history.  She is alert to verbal stimuli, endorsing methamphetamine use, however denying other drug use.  Level 5 caveat secondary to altered mental status.  The history is limited by the condition of the patient.       Past Medical History:  Diagnosis Date  . Anxiety   . Bipolar 1 disorder (HCC)   . Cocaine use   . ETOH abuse   . Hepatitis C   . Heroin abuse (HCC)   . MRSA (methicillin resistant Staphylococcus aureus)   . Suicide and self-inflicted injury (HCC)   . Suicide ideation   . Tobacco abuse     Patient Active Problem List   Diagnosis Date Noted  . Substance induced mood disorder (HCC)   . MDD (major depressive disorder), recurrent severe, without psychosis (HCC) 12/12/2018  . Polysubstance dependence (HCC) 04/29/2018  . Major depressive disorder, recurrent, severe without psychotic features (HCC) 04/24/2018  . Cellulitis 09/07/2012  . Heroin abuse (HCC) 09/07/2012  . UTI (urinary tract infection) 09/07/2012  . Anxiety   . Bipolar 1 disorder (HCC)   . Tobacco abuse     Past Surgical History:  Procedure Laterality Date  . arm    . CHOLECYSTECTOMY    . EYE SURGERY    . TUBAL LIGATION       OB History   No obstetric history on file.     History reviewed. No pertinent family history.  Social History   Tobacco Use  . Smoking status: Current Every Day Smoker    Packs/day: 1.00    Types:  Cigarettes  . Smokeless tobacco: Never Used  Substance Use Topics  . Alcohol use: Yes    Comment: last night.  drinks every day   . Drug use: Yes    Types: Cocaine, Marijuana, Methamphetamines, "Crack" cocaine, Benzodiazepines    Comment: used meth sun or mon, has been on a cocaine and alcohol binge    Home Medications Prior to Admission medications   Medication Sig Start Date End Date Taking? Authorizing Provider  acamprosate (CAMPRAL) 333 MG tablet Take 2 tablets (666 mg total) by mouth 3 (three) times daily with meals. 12/14/18   Aldean Baker, NP  acyclovir (ZOVIRAX) 400 MG tablet Take 1 tablet (400 mg total) by mouth 2 (two) times daily. 12/14/18   Aldean Baker, NP  escitalopram (LEXAPRO) 10 MG tablet Take 1 tablet (10 mg total) by mouth daily. 12/15/18   Aldean Baker, NP  gabapentin (NEURONTIN) 300 MG capsule Take 1 capsule (300 mg total) by mouth 3 (three) times daily. 12/14/18   Aldean Baker, NP  hydrOXYzine (ATARAX/VISTARIL) 25 MG tablet Take 1 tablet (25 mg total) by mouth every 6 (six) hours as needed for anxiety. 12/14/18   Aldean Baker, NP  nicotine (NICODERM CQ - DOSED IN MG/24 HOURS) 21 mg/24hr patch Place 1 patch (21 mg total) onto the skin daily. 12/15/18   Marciano Sequin  E, NP  pantoprazole (PROTONIX) 20 MG tablet Take 1 tablet (20 mg total) by mouth daily. 12/15/18   Connye Burkitt, NP  QUEtiapine (SEROQUEL) 300 MG tablet Take 1 tablet (300 mg total) by mouth at bedtime. 12/14/18   Connye Burkitt, NP    Allergies    Patient has no known allergies.  Review of Systems   Review of Systems  Unable to perform ROS: Mental status change    Physical Exam Updated Vital Signs BP 103/65   Pulse 90   Temp 98 F (36.7 C) (Oral)   Resp 18   Ht 5\' 9"  (1.753 m)   Wt 65.8 kg   SpO2 98%   BMI 21.41 kg/m   Physical Exam Vitals and nursing note reviewed.  Constitutional:      Appearance: She is well-developed.     Comments: Pt is drowsy though alert to verbal stimuli.  Verbal stimuli appears to startle patient and patients becomes fidgety in the bed, though dozes off once more.   HENT:     Head: Normocephalic and atraumatic.  Eyes:     Conjunctiva/sclera: Conjunctivae normal.  Cardiovascular:     Rate and Rhythm: Normal rate and regular rhythm.  Pulmonary:     Effort: Pulmonary effort is normal. No respiratory distress.     Breath sounds: Normal breath sounds.  Abdominal:     General: Bowel sounds are normal.     Palpations: Abdomen is soft.     Tenderness: There is no abdominal tenderness.  Skin:    General: Skin is warm.  Psychiatric:        Behavior: Behavior normal.     ED Results / Procedures / Treatments   Labs (all labs ordered are listed, but only abnormal results are displayed) Labs Reviewed  CBC - Abnormal; Notable for the following components:      Result Value   WBC 13.4 (*)    All other components within normal limits  BASIC METABOLIC PANEL  I-STAT BETA HCG BLOOD, ED ([facility], [facility], [facility] ONLY)  TROPONIN I (HIGH SENSITIVITY)  TROPONIN I (HIGH SENSITIVITY)    EKG EKG Interpretation  Date/Time:  Sunday April 02 2019 06:00:24 EST Ventricular Rate:  106 PR Interval:    QRS Duration: 80 QT Interval:  358 QTC Calculation: 476 R Axis:   69 Text Interpretation: Sinus tachycardia RSR' in V1 or V2, probably normal variant Rate faster than normal Confirmed by Pryor Curia [phone]) on 04/02/2019 6:11:46 AM   Radiology DG Chest 2 View  Result Date: 04/02/2019 CLINICAL DATA:  Chest pain EXAM: CHEST - 2 VIEW COMPARISON:  11/27/2018 FINDINGS: The heart size and mediastinal contours are within normal limits. Both lungs are clear. The visualized skeletal structures are unremarkable. IMPRESSION: No active cardiopulmonary disease. Electronically Signed   By: Franchot Gallo M.D.   On: 04/02/2019 07:10    Procedures Procedures (including critical care time)  Medications Ordered in ED Medications  sodium chloride flush (NS) 0.9 % injection 3  mL (3 mLs Intravenous Given 04/02/19 1194)    ED Course  I have reviewed the triage vital signs and the nursing notes.  Pertinent labs & imaging results that were available during my care of the patient were reviewed by me and considered in my medical decision making (see chart for details).  Clinical Course as of Apr 01 1350  Sun Apr 02, 2019  0701 Patient evaluated, alert to verbal stimuli, though is too intoxicated to provide history.  Patient is  slightly tachycardic though in no distress.  We will continue to monitor at this time and reevaluate.  Lab work obtained in triage is pending.   [JR]  0932 Pt re-evaluated. Pt easily arousable to verbal stimuli, however still unable to provide history. She denies complaints. VS remain stable. Will continue to monitor.   [JR]  1225 Patient reevaluated.  She is more alert and conversant.  Endorses methamphetamine, cocaine, and fentanyl use prior to arrival.  She states this is not the first occurrence where she is experience chest pain after using illicit drugs.  She is not currently having any pain or complaints.  Patient will be given a meal and will call her ride for transportation.   [JR]    Clinical Course User Index [JR] Ellianne Gowen, [place] N, PA-C   MDM Rules/Calculators/A&P                      Patient with history of polysubstance abuse, presenting to the ED after reportedly using cocaine, methamphetamine and fentanyl.  In triage she complained of chest pain, however on initial evaluation patient was too intoxicated to provide history.  She was monitored for extended period of time, became more alert and mentating appropriately. Pt is currently without complaint.  Her work-up is positive for mild leukocytosis though patient has no complaints of illness.  Chest x-ray is negative.  EKG is nonischemic.  Troponin x2 is negative.  Believe patient is appropriate for discharge at this time.  Final Clinical Impression(s) / ED Diagnoses Final  diagnoses:  Polysubstance abuse [facility])    Rx / DC Orders ED Discharge Orders    None       Henreitta Spittler, [place] N, PA-C 04/02/19 1353    Terrilee Files, MD 04/02/19 1718

## 2019-04-02 NOTE — ED Triage Notes (Signed)
Pt reports chest pain that started ago after using cocaine, crystal meth, and opiates. Pt states she has not slept in 5 days.

## 2019-04-08 ENCOUNTER — Other Ambulatory Visit: Payer: Self-pay

## 2019-04-08 ENCOUNTER — Emergency Department (HOSPITAL_COMMUNITY): Payer: Self-pay

## 2019-04-08 ENCOUNTER — Emergency Department (HOSPITAL_COMMUNITY)
Admission: EM | Admit: 2019-04-08 | Discharge: 2019-04-09 | Disposition: A | Discharge status: Home / Self Care | Payer: Self-pay | Attending: Emergency Medicine | Admitting: Emergency Medicine

## 2019-04-08 DIAGNOSIS — R45851 Suicidal ideations: Secondary | ICD-10-CM | POA: Insufficient documentation

## 2019-04-08 DIAGNOSIS — F1920 Other psychoactive substance dependence, uncomplicated: Secondary | ICD-10-CM | POA: Diagnosis present

## 2019-04-08 DIAGNOSIS — F32A Depression, unspecified: Secondary | ICD-10-CM

## 2019-04-08 DIAGNOSIS — F1910 Other psychoactive substance abuse, uncomplicated: Secondary | ICD-10-CM | POA: Insufficient documentation

## 2019-04-08 DIAGNOSIS — M79641 Pain in right hand: Secondary | ICD-10-CM | POA: Insufficient documentation

## 2019-04-08 DIAGNOSIS — F329 Major depressive disorder, single episode, unspecified: Secondary | ICD-10-CM

## 2019-04-08 DIAGNOSIS — Z79899 Other long term (current) drug therapy: Secondary | ICD-10-CM | POA: Insufficient documentation

## 2019-04-08 DIAGNOSIS — Z20822 Contact with and (suspected) exposure to covid-19: Secondary | ICD-10-CM | POA: Insufficient documentation

## 2019-04-08 DIAGNOSIS — J020 Streptococcal pharyngitis: Secondary | ICD-10-CM | POA: Insufficient documentation

## 2019-04-08 DIAGNOSIS — F332 Major depressive disorder, recurrent severe without psychotic features: Secondary | ICD-10-CM | POA: Insufficient documentation

## 2019-04-08 DIAGNOSIS — F1414 Cocaine abuse with cocaine-induced mood disorder: Secondary | ICD-10-CM

## 2019-04-08 DIAGNOSIS — Z008 Encounter for other general examination: Secondary | ICD-10-CM | POA: Insufficient documentation

## 2019-04-08 LAB — COMPREHENSIVE METABOLIC PANEL
ALT: 44 U/L (ref 0–44)
AST: 31 U/L (ref 15–41)
Albumin: 3.3 g/dL — ABNORMAL LOW (ref 3.5–5.0)
Alkaline Phosphatase: 56 U/L (ref 38–126)
Anion gap: 7 (ref 5–15)
BUN: 12 mg/dL (ref 6–20)
CO2: 28 mmol/L (ref 22–32)
Calcium: 8.3 mg/dL — ABNORMAL LOW (ref 8.9–10.3)
Chloride: 108 mmol/L (ref 98–111)
Creatinine, Ser: 0.73 mg/dL (ref 0.44–1.00)
GFR calc Af Amer: >60 mL/min (ref >60)
GFR calc non Af Amer: >60 mL/min (ref >60)
Glucose, Bld: 100 mg/dL — ABNORMAL HIGH (ref 70–99)
Potassium: 3.8 mmol/L (ref 3.5–5.1)
Sodium: 143 mmol/L (ref 135–145)
Total Bilirubin: 0.3 mg/dL (ref 0.3–1.2)
Total Protein: 6.3 g/dL — ABNORMAL LOW (ref 6.5–8.1)

## 2019-04-08 LAB — CBC WITH DIFFERENTIAL/PLATELET
Abs Immature Granulocytes: 0.05 10*3/uL (ref 0.00–0.07)
Basophils Absolute: 0.1 10*3/uL (ref 0.0–0.1)
Basophils Relative: 1 %
Eosinophils Absolute: 0.2 10*3/uL (ref 0.0–0.5)
Eosinophils Relative: 1 %
HCT: 40.8 % (ref 36.0–46.0)
Hemoglobin: 13 g/dL (ref 12.0–15.0)
Immature Granulocytes: 0 %
Lymphocytes Relative: 19 %
Lymphs Abs: 2.6 10*3/uL (ref 0.7–4.0)
MCH: 28.7 pg (ref 26.0–34.0)
MCHC: 31.9 g/dL (ref 30.0–36.0)
MCV: 90.1 fL (ref 80.0–100.0)
Monocytes Absolute: 0.8 10*3/uL (ref 0.1–1.0)
Monocytes Relative: 6 %
Neutro Abs: 10.2 10*3/uL — ABNORMAL HIGH (ref 1.7–7.7)
Neutrophils Relative %: 73 %
Platelets: 346 10*3/uL (ref 150–400)
RBC: 4.53 MIL/uL (ref 3.87–5.11)
RDW: 14.7 % (ref 11.5–15.5)
WBC: 14 10*3/uL — ABNORMAL HIGH (ref 4.0–10.5)
nRBC: 0 % (ref 0.0–0.2)

## 2019-04-08 LAB — GROUP A STREP BY PCR: Group A Strep by PCR: DETECTED — AB

## 2019-04-08 LAB — RESPIRATORY PANEL BY RT PCR (FLU A&B, COVID)
Influenza A by PCR: NEGATIVE
Influenza B by PCR: NEGATIVE
SARS Coronavirus 2 by RT PCR: NEGATIVE

## 2019-04-08 LAB — RAPID URINE DRUG SCREEN, HOSP PERFORMED
Amphetamines: NOT DETECTED
Barbiturates: NOT DETECTED
Benzodiazepines: NOT DETECTED
Cocaine: POSITIVE — AB
Opiates: NOT DETECTED
Tetrahydrocannabinol: NOT DETECTED

## 2019-04-08 LAB — SALICYLATE LEVEL: Salicylate Lvl: <7 mg/dL — ABNORMAL LOW (ref 7.0–30.0)

## 2019-04-08 LAB — I-STAT BETA HCG BLOOD, ED (MC, WL, AP ONLY): I-stat hCG, quantitative: <5 m[IU]/mL (ref <5)

## 2019-04-08 LAB — ACETAMINOPHEN LEVEL: Acetaminophen (Tylenol), Serum: <10 ug/mL — ABNORMAL LOW (ref 10–30)

## 2019-04-08 LAB — ETHANOL: Alcohol, Ethyl (B): <10 mg/dL (ref <10)

## 2019-04-08 MED ORDER — IBUPROFEN 200 MG PO TABS
600.0000 mg | ORAL_TABLET | Freq: Three times a day (TID) | ORAL | Status: DC | PRN
  Administered 2019-04-08: 600 mg via ORAL
  Filled 2019-04-08: qty 3

## 2019-04-08 MED ORDER — PENICILLIN G BENZATHINE 1200000 UNIT/2ML IM SUSP
1.2000 10*6.[IU] | Freq: Once | INTRAMUSCULAR | Status: AC
  Administered 2019-04-08: 1.2 10*6.[IU] via INTRAMUSCULAR
  Filled 2019-04-08: qty 2

## 2019-04-08 NOTE — [facility] Assessment (Signed)
[facility] Assessment Progress Note  Case was staffed with Tate NP who recommended patient be observed and monitored.     

## 2019-04-08 NOTE — ED Provider Notes (Signed)
[facility]-EMERGENCY DEPT Provider Note   CSN: [id number] Arrival date & time: 04/08/19  1146     History Chief Complaint  Patient presents with  . Sore Throat  . Medical Clearance  . Suicidal    Gail Castro is a 39 y.o. female.  Gail Castro is a 39 y.o. female with a history of suicidal ideation, self injury, polysubstance abuse, bipolar disorder, hep C, who presents to the emergency department for evaluation of suicidal ideations and substance abuse.  Patient reports that she ran out of her psych medications, usually takes Lexapro, gabapentin and Seroquel, she started feeling increasingly depressed and had missed her follow-up appointment to get a refill of her medications so began self-medicating and went on a 2-week binge with alcohol, cocaine, crack, methamphetamines, and heroin.  She came home Monday and try to detox herself but began feeling worse, continue to use cocaine and was feeling increasingly depressed with suicidal ideations, and drink alcohol last night.  Patient states that she is continuing to have suicidal ideations but denies plan, denies HI or AVH.  Reports she wants to get help with the suicidal thoughts and get back on her medications and also wants help with her substance abuse.  She tried to get checked into [facility] but they did not have any beds available today.  She does state that she has had a sore throat over the past week and a half, had a few days of cough that is since resolved she has not had any measured fevers.  Denies rhinorrhea or nasal congestion.  Denies any chest pain or shortness of breath, no abdominal pain, vomiting or diarrhea.  Patient states that she thinks she punched a wall last night and has some pain and swelling over the fifth metacarpal on her right hand, denies any other injuries or pain over her extremities.        Past Medical History:  Diagnosis Date  . Anxiety   . Bipolar 1 disorder (HCC)     . Cocaine use   . ETOH abuse   . Hepatitis C   . Heroin abuse (HCC)   . MRSA (methicillin resistant Staphylococcus aureus)   . Suicide and self-inflicted injury (HCC)   . Suicide ideation   . Tobacco abuse     Patient Active Problem List   Diagnosis Date Noted  . Substance induced mood disorder (HCC)   . MDD (major depressive disorder), recurrent severe, without psychosis (HCC) 12/12/2018  . Polysubstance dependence (HCC) 04/29/2018  . Major depressive disorder, recurrent, severe without psychotic features (HCC) 04/24/2018  . Cellulitis 09/07/2012  . Heroin abuse (HCC) 09/07/2012  . UTI (urinary tract infection) 09/07/2012  . Anxiety   . Bipolar 1 disorder (HCC)   . Tobacco abuse     Past Surgical History:  Procedure Laterality Date  . arm    . CHOLECYSTECTOMY    . EYE SURGERY    . TUBAL LIGATION       OB History   No obstetric history on file.     No family history on file.  Social History   Tobacco Use  . Smoking status: Current Every Day Smoker    Packs/day: 1.00    Types: Cigarettes  . Smokeless tobacco: Never Used  Substance Use Topics  . Alcohol use: Yes    Comment: last night.  drinks every day   . Drug use: Yes    Types: Cocaine, Marijuana, Methamphetamines, "Crack" cocaine, Benzodiazepines  Comment: used meth sun or mon, has been on a cocaine and alcohol binge    Home Medications Prior to Admission medications   Medication Sig Start Date End Date Taking? Authorizing Provider  acamprosate (CAMPRAL) 333 MG tablet Take 2 tablets (666 mg total) by mouth 3 (three) times daily with meals. 12/14/18   Connye Burkitt, NP  acyclovir (ZOVIRAX) 400 MG tablet Take 1 tablet (400 mg total) by mouth 2 (two) times daily. 12/14/18   Connye Burkitt, NP  escitalopram (LEXAPRO) 10 MG tablet Take 1 tablet (10 mg total) by mouth daily. 12/15/18   Connye Burkitt, NP  gabapentin (NEURONTIN) 300 MG capsule Take 1 capsule (300 mg total) by mouth 3 (three) times daily.  12/14/18   Connye Burkitt, NP  hydrOXYzine (ATARAX/VISTARIL) 25 MG tablet Take 1 tablet (25 mg total) by mouth every 6 (six) hours as needed for anxiety. 12/14/18   Connye Burkitt, NP  nicotine (NICODERM CQ - DOSED IN MG/24 HOURS) 21 mg/24hr patch Place 1 patch (21 mg total) onto the skin daily. 12/15/18   Connye Burkitt, NP  pantoprazole (PROTONIX) 20 MG tablet Take 1 tablet (20 mg total) by mouth daily. 12/15/18   Connye Burkitt, NP  QUEtiapine (SEROQUEL) 300 MG tablet Take 1 tablet (300 mg total) by mouth at bedtime. 12/14/18   Connye Burkitt, NP    Allergies    Patient has no known allergies.  Review of Systems   Review of Systems  Constitutional: Negative for chills and fever.  HENT: Positive for sore throat. Negative for congestion and rhinorrhea.   Respiratory: Negative for cough and shortness of breath.   Cardiovascular: Negative for chest pain.  Gastrointestinal: Negative for abdominal pain, nausea and vomiting.  Musculoskeletal: Positive for arthralgias and joint swelling.  Skin: Negative for color change and rash.  Neurological: Negative for dizziness, syncope and light-headedness.  Psychiatric/Behavioral: Positive for dysphoric mood, self-injury and suicidal ideas. Negative for agitation and hallucinations.  All other systems reviewed and are negative.   Physical Exam Updated Vital Signs BP 121/85 (BP Location: Left Arm)   Pulse 64   Temp 98.3 F (36.8 C) (Oral)   Resp 18   Ht 5\' 9"  (1.753 m)   Wt 72.6 kg   SpO2 99%   BMI 23.63 kg/m   Physical Exam Vitals and nursing note reviewed.  Constitutional:      General: She is not in acute distress.    Appearance: She is well-developed and normal weight. She is not diaphoretic.  HENT:     Head: Normocephalic and atraumatic.     Mouth/Throat:     Mouth: Mucous membranes are moist.     Pharynx: Uvula midline. Pharyngeal swelling and posterior oropharyngeal erythema present. No oropharyngeal exudate or uvula swelling.      Tonsils: No tonsillar exudate or tonsillar abscesses. 2+ on the right. 2+ on the left.     Comments: Posterior oropharynx is erythematous with 2+ tonsillar edema noted, no significant exudates, uvula midline, no signs of PTA Eyes:     General:        Right eye: No discharge.        Left eye: No discharge.  Cardiovascular:     Rate and Rhythm: Normal rate and regular rhythm.     Heart sounds: Normal heart sounds.  Pulmonary:     Effort: Pulmonary effort is normal. No respiratory distress.     Breath sounds: Normal breath sounds. No wheezing or  rales.     Comments: Respirations equal and unlabored, patient able to speak in full sentences, lungs clear to auscultation bilaterally Abdominal:     General: Bowel sounds are normal. There is no distension.     Palpations: Abdomen is soft. There is no mass.     Tenderness: There is no abdominal tenderness. There is no guarding.     Comments: Abdomen soft, nondistended, nontender to palpation in all quadrants without guarding or peritoneal signs  Musculoskeletal:        General: No deformity.     Cervical back: Neck supple.  Skin:    General: Skin is warm and dry.     Capillary Refill: Capillary refill takes less than 2 seconds.     Comments: Superficial cuts noted to the left inner forearm without surrounding signs of cellulitis  Neurological:     Mental Status: She is alert.     Coordination: Coordination normal.     Comments: Speech is clear, able to follow commands Moves extremities without ataxia, coordination intact  Psychiatric:        Attention and Perception: She does not perceive auditory or visual hallucinations.        Mood and Affect: Affect normal. Mood is depressed.        Speech: Speech normal.        Behavior: Behavior normal. Behavior is cooperative.        Thought Content: Thought content includes suicidal ideation. Thought content does not include homicidal ideation. Thought content does not include homicidal plan.      ED Results / Procedures / Treatments   Labs (all labs ordered are listed, but only abnormal results are displayed) Labs Reviewed  GROUP A STREP BY PCR - Abnormal; Notable for the following components:      Result Value   Group A Strep by PCR DETECTED (*)    All other components within normal limits  COMPREHENSIVE METABOLIC PANEL - Abnormal; Notable for the following components:   Glucose, Bld 100 (*)    Calcium 8.3 (*)    Total Protein 6.3 (*)    Albumin 3.3 (*)    All other components within normal limits  RAPID URINE DRUG SCREEN, HOSP PERFORMED - Abnormal; Notable for the following components:   Cocaine POSITIVE (*)    All other components within normal limits  CBC WITH DIFFERENTIAL/PLATELET - Abnormal; Notable for the following components:   WBC 14.0 (*)    Neutro Abs 10.2 (*)    All other components within normal limits  ACETAMINOPHEN LEVEL - Abnormal; Notable for the following components:   Acetaminophen (Tylenol), Serum <10 (*)    All other components within normal limits  SALICYLATE LEVEL - Abnormal; Notable for the following components:   Salicylate Lvl <7.0 (*)    All other components within normal limits  RESPIRATORY PANEL BY RT PCR (FLU A&B, COVID)  ETHANOL  I-STAT BETA HCG BLOOD, ED ([facility], [facility], [facility] ONLY)    EKG None  Radiology DG Hand Complete Right  Result Date: 04/08/2019 CLINICAL DATA:  Right hand pain from blunt trauma. EXAM: RIGHT HAND - COMPLETE 3+ VIEW COMPARISON:  None. FINDINGS: No evidence of acute fracture or dislocation. Remainder the exam is unremarkable. IMPRESSION: No acute findings. Electronically Signed   By: Elberta Fortis M.D.   On: 04/08/2019 13:06    Procedures Procedures (including critical care time)  Medications Ordered in ED Medications  ibuprofen (ADVIL) tablet 600 mg (600 mg Oral Given 04/08/19 1635)  penicillin g benzathine (BICILLIN LA) 1200000 UNIT/2ML injection 1.2 Million Units (1.2 Million Units Intramuscular Given 04/08/19  1512)    ED Course  I have reviewed the triage vital signs and the nursing notes.  Pertinent labs & imaging results that were available during my care of the patient were reviewed by me and considered in my medical decision making (see chart for details).    MDM Rules/Calculators/A&P                     39 year old female presents with intermittent SI and substance abuse.  States that she ran out of her psych medications and then began self-medicating with alcohol, cocaine, meth and heroin.  She denies current plan for SI denies HI or AVH.  Also complaining of sore throat, no fevers or other infectious symptoms.  Will get medical screening labs as well as Covid and strep swab.  Patient also states that she thinks she punched a wall last night and has some pain and swelling over her right hand.  Will get x-ray.  Will consult TTS for evaluation.  Labs show leukocytosis of 14 likely in the setting of strep infection as patient strep PCR was positive, she will be treated with penicillin IM, no significant electrolyte derangements, negative ethanol, acetaminophen and salicylate levels, UDS positive for cocaine as reported.  Negative Covid screening.  X-ray of the right hand shows no evidence of fracture.  At this time patient is medically cleared pending TTS disposition.  TTS has seen and evaluated the patient, she has been trying to get admitted multiple places for substance abuse but they have not had available beds.  They will observe patient overnight and hopefully get her restarted on her medications.  Final Clinical Impression(s) / ED Diagnoses Final diagnoses:  Suicidal ideation  Depression, unspecified depression type  Strep pharyngitis  Polysubstance abuse [facility])    Rx / DC Orders ED Discharge Orders    None       Dartha Lodge, PA-C 04/08/19 1727    Virgina Norfolk, DO 04/08/19 1945

## 2019-04-08 NOTE — ED Notes (Addendum)
Pt has 4 belonging bags, Located in [place] [facility] [address]
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
## 2019-04-08 NOTE — Progress Notes (Signed)
Received Gail Castro awake in her room watching the TV with the sitter at the bedside. She was given a snack of her choice later. She slept throughout the night without incident.

## 2019-04-08 NOTE — Progress Notes (Signed)
04/08/2019  1316  Labs drawn. Sent dark green tube to mini lab. Sent Gold, purple, light green, dark green, and red tubes to main lab.

## 2019-04-09 DIAGNOSIS — F1414 Cocaine abuse with cocaine-induced mood disorder: Secondary | ICD-10-CM

## 2019-04-09 MED ORDER — GABAPENTIN 300 MG PO CAPS
300.0000 mg | ORAL_CAPSULE | Freq: Two times a day (BID) | ORAL | Status: DC
  Administered 2019-04-09: 300 mg via ORAL
  Filled 2019-04-09: qty 1

## 2019-04-09 MED ORDER — GABAPENTIN 300 MG PO CAPS: 300.0000 mg | ORAL_CAPSULE | Freq: Two times a day (BID) | ORAL | 0 refills | Status: DC

## 2019-04-09 MED ORDER — ESCITALOPRAM OXALATE 10 MG PO TABS
10.0000 mg | ORAL_TABLET | Freq: Every day | ORAL | Status: DC
  Administered 2019-04-09: 10 mg via ORAL
  Filled 2019-04-09: qty 1

## 2019-04-09 MED ORDER — ESCITALOPRAM OXALATE 10 MG PO TABS: 10.0000 mg | ORAL_TABLET | Freq: Every day | ORAL | 0 refills | Status: DC

## 2019-04-09 NOTE — Consult Note (Addendum)
[facility] Psych ED Discharge  04/09/2019 12:37 PM Gail Castro  MRN:  [id number] Principal Problem: Cocaine abuse with cocaine-induced mood disorder [facility]) Discharge Diagnoses: Principal Problem:   Cocaine abuse with cocaine-induced mood disorder (HCC) Active Problems:   Polysubstance dependence (HCC)   Subjective: Patient assessed by nurse practitioner, along with Dr Jannifer Franklin.  Patient alert and oriented, answers appropriately.  Patient states "I keep getting in trouble, I keep overdosing, it is making me really depressed."  Patient reports desire to stop using substance. Patient denies suicidal and homicidal ideations.  Patient denies access to weapons.  Patient denies auditory and visual hallucinations.  Patient reports she has been using crack cocaine as well as alcohol states "I have not used ice or heroin since Monday." Patient reports decreased symptoms when taking Lexapro and gabapentin however she has run out of these medications.  Patient reports she lives in [place] with her fianc.  Patient reports she can return home and feels safe at home.  Total Time spent with patient: 30 minutes  Past Psychiatric History: Anxiety, MDD, Substance induced mood disorder  Past Medical History:  Past Medical History:  Diagnosis Date  . Anxiety   . Bipolar 1 disorder (HCC)   . Cocaine use   . ETOH abuse   . Hepatitis C   . Heroin abuse (HCC)   . MRSA (methicillin resistant Staphylococcus aureus)   . Suicide and self-inflicted injury (HCC)   . Suicide ideation   . Tobacco abuse     Past Surgical History:  Procedure Laterality Date  . arm    . CHOLECYSTECTOMY    . EYE SURGERY    . TUBAL LIGATION     Family History: No family history on file. Family Psychiatric  History: Denies Social History:  Social History   Substance and Sexual Activity  Alcohol Use Yes   Comment: last night.  drinks every day      Social History   Substance and Sexual Activity  Drug Use Yes  . Types:  Cocaine, Marijuana, Methamphetamines, "Crack" cocaine, Benzodiazepines   Comment: used meth sun or mon, has been on a cocaine and alcohol binge    Social History   Socioeconomic History  . Marital status: Divorced    Spouse name: Not on file  . Number of children: Not on file  . Years of education: Not on file  . Highest education level: Not on file  Occupational History  . Not on file  Tobacco Use  . Smoking status: Current Every Day Smoker    Packs/day: 1.00    Types: Cigarettes  . Smokeless tobacco: Never Used  Substance and Sexual Activity  . Alcohol use: Yes    Comment: last night.  drinks every day   . Drug use: Yes    Types: Cocaine, Marijuana, Methamphetamines, "Crack" cocaine, Benzodiazepines    Comment: used meth sun or mon, has been on a cocaine and alcohol binge  . Sexual activity: Yes    Birth control/protection: Surgical  Other Topics Concern  . Not on file  Social History Narrative  . Not on file   Social Determinants of Health   Financial Resource Strain:   . Difficulty of Paying Living Expenses: Not on file  Food Insecurity:   . Worried About Programme researcher, broadcasting/film/video in the Last Year: Not on file  . Ran Out of Food in the Last Year: Not on file  Transportation Needs:   . Lack of Transportation (Medical): Not on file  .  Lack of Transportation (Non-Medical): Not on file  Physical Activity:   . Days of Exercise per Week: Not on file  . Minutes of Exercise per Session: Not on file  Stress:   . Feeling of Stress : Not on file  Social Connections:   . Frequency of Communication with Friends and Family: Not on file  . Frequency of Social Gatherings with Friends and Family: Not on file  . Attends Religious Services: Not on file  . Active Member of Clubs or Organizations: Not on file  . Attends Banker Meetings: Not on file  . Marital Status: Not on file    Has this patient used any form of tobacco in the last 30 days? (Cigarettes, Smokeless  Tobacco, Cigars, and/or Pipes) A prescription for an FDA-approved tobacco cessation medication was offered at discharge and the patient refused  Current Medications: Current Facility-Administered Medications  Medication Dose Route Frequency Provider Last Rate Last Admin  . escitalopram (LEXAPRO) tablet 10 mg  10 mg Oral Daily Jerrett Baldinger, MD   10 mg at 04/09/19 1057  . gabapentin (NEURONTIN) capsule 300 mg  300 mg Oral BID Rashan Rounsaville, MD   300 mg at 04/09/19 1057  . ibuprofen (ADVIL) tablet 600 mg  600 mg Oral Q8H PRN Dartha Lodge, PA-C   600 mg at 04/08/19 1635   Current Outpatient Medications  Medication Sig Dispense Refill  . acamprosate (CAMPRAL) 333 MG tablet Take 2 tablets (666 mg total) by mouth 3 (three) times daily with meals. 180 tablet 0  . acyclovir (ZOVIRAX) 400 MG tablet Take 1 tablet (400 mg total) by mouth 2 (two) times daily. 6 tablet 0  . escitalopram (LEXAPRO) 10 MG tablet Take 1 tablet (10 mg total) by mouth daily. 30 tablet 0  . gabapentin (NEURONTIN) 300 MG capsule Take 1 capsule (300 mg total) by mouth 2 (two) times daily. 60 capsule 0  . hydrOXYzine (ATARAX/VISTARIL) 25 MG tablet Take 1 tablet (25 mg total) by mouth every 6 (six) hours as needed for anxiety. 30 tablet 0  . nicotine (NICODERM CQ - DOSED IN MG/24 HOURS) 21 mg/24hr patch Place 1 patch (21 mg total) onto the skin daily. 28 patch 0  . pantoprazole (PROTONIX) 20 MG tablet Take 1 tablet (20 mg total) by mouth daily. 30 tablet 0  . QUEtiapine (SEROQUEL) 300 MG tablet Take 1 tablet (300 mg total) by mouth at bedtime. 30 tablet 0   PTA Medications: (Not in a hospital admission)   Musculoskeletal: Strength & Muscle Tone: within normal limits Gait & Station: normal Patient leans: N/A  Psychiatric Specialty Exam: Physical Exam Vitals and nursing note reviewed.  Constitutional:      Appearance: She is well-developed.  HENT:     Head: Normocephalic.  Cardiovascular:     Rate and Rhythm:  Normal rate.  Pulmonary:     Effort: Pulmonary effort is normal.  Neurological:     Mental Status: She is alert and oriented to person, place, and time.  Psychiatric:        Mood and Affect: Mood normal.        Behavior: Behavior normal.     Review of Systems  Constitutional: Negative.   HENT: Negative.   Eyes: Negative.   Respiratory: Negative.   Cardiovascular: Negative.   Gastrointestinal: Negative.   Genitourinary: Negative.   Musculoskeletal: Negative.   Skin: Negative.   Neurological: Negative.     Blood pressure 102/67, pulse (!) 49, temperature 97.9 F (36.6  C), temperature source Oral, resp. rate 15, height 5\' 9"  (1.753 m), weight 72.6 kg, last menstrual period 03/18/2019, SpO2 98 %.Body mass index is 23.63 kg/m.  General Appearance: Casual and Fairly Groomed  Eye Contact:  Good  Speech:  Clear and Coherent and Normal Rate  Volume:  Normal  Mood:  Euthymic  Affect:  Appropriate and Congruent  Thought Process:  Coherent, Goal Directed and Descriptions of Associations: Intact  Orientation:  Full (Time, Place, and Person)  Thought Content:  WDL and Logical  Suicidal Thoughts:  No  Homicidal Thoughts:  No  Memory:  Immediate;   Good Recent;   Good Remote;   Good  Judgement:  Fair  Insight:  Fair  Psychomotor Activity:  Normal  Concentration:  Concentration: Good and Attention Span: Good  Recall:  Good  Fund of Knowledge:  Good  Language:  Good  Akathisia:  No  Handed:  Right  AIMS (if indicated):     Assets:  Communication Skills Desire for Improvement Financial Resources/Insurance Housing Intimacy Leisure Time Physical Health Resilience Social Support  ADL's:  Intact  Cognition:  WNL  Sleep:        Demographic Factors:  NA  Loss Factors: NA  Historical Factors: NA  Risk Reduction Factors:   Sense of responsibility to family, Living with another person, especially a relative, Positive social support, Positive therapeutic relationship and  Positive coping skills or problem solving skills  Continued Clinical Symptoms:  Alcohol/Substance Abuse/Dependencies  Cognitive Features That Contribute To Risk:  None    Suicide Risk:  Minimal: No identifiable suicidal ideation.  Patients presenting with no risk factors but with morbid ruminations; may be classified as minimal risk based on the severity of the depressive symptoms    Plan Of Care/Follow-up recommendations:  Other:  Follow up with outpatient psychiatry and substance use treatment  Peers support consult ordered. Lexapro and gabapentin prescriptions provided.  Disposition: Discharge Gail Castro, [place] 04/09/2019, 12:37 PM  Patient seen face-to-face for psychiatric evaluation, chart reviewed and case discussed with the physician extender and developed treatment plan. Reviewed the information documented and agree with the treatment plan. Corena Pilgrim, MD

## 2019-04-09 NOTE — ED Notes (Signed)
Pt is very upset that peer support never came.  Patient was told that they would reach out to her in the community.

## 2019-04-20 ENCOUNTER — Other Ambulatory Visit: Payer: Self-pay

## 2019-04-20 ENCOUNTER — Ambulatory Visit: Payer: Self-pay | Attending: Internal Medicine

## 2019-04-20 DIAGNOSIS — Z20822 Contact with and (suspected) exposure to covid-19: Secondary | ICD-10-CM | POA: Insufficient documentation

## 2019-04-21 LAB — NOVEL CORONAVIRUS, NAA: SARS-CoV-2, NAA: NOT DETECTED

## 2019-04-27 ENCOUNTER — Other Ambulatory Visit: Payer: Self-pay

## 2019-04-28 ENCOUNTER — Other Ambulatory Visit: Payer: Self-pay

## 2019-07-03 ENCOUNTER — Ambulatory Visit: Payer: Self-pay | Admitting: Pharmacy Technician

## 2019-07-03 ENCOUNTER — Other Ambulatory Visit: Payer: Self-pay

## 2019-07-03 DIAGNOSIS — Z79899 Other long term (current) drug therapy: Secondary | ICD-10-CM

## 2019-07-03 NOTE — Progress Notes (Signed)
Completed [facility] application and contract.  Patient agreed to all terms of the [facility] contract.    Patient approved to receive medication assistance at [facility] until time for re-certification in 2022, and as long as eligibility criteria continues to be met.    Provided patient with community resource material based on her particular needs.    Anquanette Bahner J. Avarie Tavano Care Manager [facility]  

## 2019-07-06 ENCOUNTER — Other Ambulatory Visit: Payer: Self-pay

## 2019-07-06 ENCOUNTER — Ambulatory Visit: Payer: Self-pay | Admitting: Gerontology

## 2019-07-06 VITALS — BP 107/72 | HR 92 | Ht 69.0 in | Wt 184.0 lb

## 2019-07-06 DIAGNOSIS — M25561 Pain in right knee: Secondary | ICD-10-CM | POA: Insufficient documentation

## 2019-07-06 DIAGNOSIS — Z8719 Personal history of other diseases of the digestive system: Secondary | ICD-10-CM

## 2019-07-06 DIAGNOSIS — G8929 Other chronic pain: Secondary | ICD-10-CM

## 2019-07-06 DIAGNOSIS — Z7689 Persons encountering health services in other specified circumstances: Secondary | ICD-10-CM | POA: Insufficient documentation

## 2019-07-06 DIAGNOSIS — K029 Dental caries, unspecified: Secondary | ICD-10-CM | POA: Insufficient documentation

## 2019-07-06 DIAGNOSIS — R202 Paresthesia of skin: Secondary | ICD-10-CM | POA: Insufficient documentation

## 2019-07-06 DIAGNOSIS — R200 Anesthesia of skin: Secondary | ICD-10-CM | POA: Insufficient documentation

## 2019-07-06 DIAGNOSIS — K219 Gastro-esophageal reflux disease without esophagitis: Secondary | ICD-10-CM | POA: Insufficient documentation

## 2019-07-06 DIAGNOSIS — Z8659 Personal history of other mental and behavioral disorders: Secondary | ICD-10-CM | POA: Insufficient documentation

## 2019-07-06 DIAGNOSIS — Z8619 Personal history of other infectious and parasitic diseases: Secondary | ICD-10-CM | POA: Insufficient documentation

## 2019-07-06 DIAGNOSIS — B009 Herpesviral infection, unspecified: Secondary | ICD-10-CM

## 2019-07-06 MED ORDER — GABAPENTIN 300 MG PO CAPS: 300.0000 mg | ORAL_CAPSULE | Freq: Two times a day (BID) | ORAL | 1 refills | Status: DC

## 2019-07-06 MED ORDER — PANTOPRAZOLE SODIUM 20 MG PO TBEC: 20.0000 mg | DELAYED_RELEASE_TABLET | Freq: Every day | ORAL | 2 refills | Status: AC

## 2019-07-06 MED ORDER — ACYCLOVIR 400 MG PO TABS: 400.0000 mg | ORAL_TABLET | Freq: Every day | ORAL | 0 refills | Status: AC
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
## 2019-07-12 ENCOUNTER — Other Ambulatory Visit: Payer: Self-pay

## 2019-07-12 DIAGNOSIS — Z7689 Persons encountering health services in other specified circumstances: Secondary | ICD-10-CM

## 2019-07-12 DIAGNOSIS — Z8619 Personal history of other infectious and parasitic diseases: Secondary | ICD-10-CM

## 2019-07-13 LAB — COMPREHENSIVE METABOLIC PANEL
ALT: 47 IU/L — ABNORMAL HIGH (ref 0–32)
AST: 43 IU/L — ABNORMAL HIGH (ref 0–40)
Albumin/Globulin Ratio: 1.4 (ref 1.2–2.2)
Albumin: 3.8 g/dL (ref 3.8–4.8)
Alkaline Phosphatase: 83 IU/L (ref 39–117)
BUN/Creatinine Ratio: 15 (ref 9–23)
BUN: 13 mg/dL (ref 6–20)
Bilirubin Total: 0.2 mg/dL (ref 0.0–1.2)
CO2: 26 mmol/L (ref 20–29)
Calcium: 9 mg/dL (ref 8.7–10.2)
Chloride: 104 mmol/L (ref 96–106)
Creatinine, Ser: 0.85 mg/dL (ref 0.57–1.00)
GFR calc Af Amer: 101 mL/min/{1.73_m2} (ref >59)
GFR calc non Af Amer: 87 mL/min/{1.73_m2} (ref >59)
Globulin, Total: 2.7 g/dL (ref 1.5–4.5)
Glucose: 88 mg/dL (ref 65–99)
Potassium: 4.5 mmol/L (ref 3.5–5.2)
Sodium: 142 mmol/L (ref 134–144)
Total Protein: 6.5 g/dL (ref 6.0–8.5)

## 2019-07-13 LAB — URINALYSIS
Bilirubin, UA: NEGATIVE
Glucose, UA: NEGATIVE
Ketones, UA: NEGATIVE
Leukocytes,UA: NEGATIVE
Nitrite, UA: NEGATIVE
Protein,UA: NEGATIVE
RBC, UA: NEGATIVE
Specific Gravity, UA: 1.012 (ref 1.005–1.030)
Urobilinogen, Ur: 0.2 mg/dL (ref 0.2–1.0)
pH, UA: 5.5 (ref 5.0–7.5)

## 2019-07-13 LAB — CBC WITH DIFFERENTIAL/PLATELET
Basophils Absolute: 0.1 10*3/uL (ref 0.0–0.2)
Basos: 1 %
EOS (ABSOLUTE): 0.3 10*3/uL (ref 0.0–0.4)
Eos: 3 %
Hematocrit: 39 % (ref 34.0–46.6)
Hemoglobin: 13.2 g/dL (ref 11.1–15.9)
Immature Grans (Abs): 0.1 10*3/uL (ref 0.0–0.1)
Immature Granulocytes: 1 %
Lymphocytes Absolute: 2.2 10*3/uL (ref 0.7–3.1)
Lymphs: 23 %
MCH: 29.4 pg (ref 26.6–33.0)
MCHC: 33.8 g/dL (ref 31.5–35.7)
MCV: 87 fL (ref 79–97)
Monocytes Absolute: 0.5 10*3/uL (ref 0.1–0.9)
Monocytes: 6 %
Neutrophils Absolute: 6.4 10*3/uL (ref 1.4–7.0)
Neutrophils: 66 %
Platelets: 349 10*3/uL (ref 150–450)
RBC: 4.49 x10E6/uL (ref 3.77–5.28)
RDW: 12.9 % (ref 11.7–15.4)
WBC: 9.6 10*3/uL (ref 3.4–10.8)

## 2019-07-13 LAB — HEPATITIS PANEL, ACUTE
Hep A IgM: NEGATIVE
Hep B C IgM: NEGATIVE
Hep C Virus Ab: >11 s/co ratio — ABNORMAL HIGH (ref 0.0–0.9)
Hepatitis B Surface Ag: NEGATIVE

## 2019-07-18 ENCOUNTER — Other Ambulatory Visit: Payer: Self-pay | Admitting: Gerontology

## 2019-07-18 DIAGNOSIS — Z8619 Personal history of other infectious and parasitic diseases: Secondary | ICD-10-CM

## 2019-07-24 ENCOUNTER — Telehealth: Payer: Self-pay

## 2019-08-03 ENCOUNTER — Other Ambulatory Visit: Payer: Self-pay | Admitting: Gerontology

## 2019-08-15 ENCOUNTER — Ambulatory Visit: Payer: Self-pay | Admitting: Specialist

## 2019-08-17 ENCOUNTER — Ambulatory Visit: Payer: Self-pay | Admitting: Gerontology

## 2019-08-22 ENCOUNTER — Emergency Department: Payer: Self-pay

## 2019-08-22 ENCOUNTER — Encounter: Payer: Self-pay | Admitting: Emergency Medicine

## 2019-08-22 DIAGNOSIS — R002 Palpitations: Secondary | ICD-10-CM | POA: Insufficient documentation

## 2019-08-22 DIAGNOSIS — R5383 Other fatigue: Secondary | ICD-10-CM | POA: Insufficient documentation

## 2019-08-22 DIAGNOSIS — R0789 Other chest pain: Secondary | ICD-10-CM | POA: Insufficient documentation

## 2019-08-22 DIAGNOSIS — Z5321 Procedure and treatment not carried out due to patient leaving prior to being seen by health care provider: Secondary | ICD-10-CM | POA: Insufficient documentation

## 2019-08-22 LAB — TROPONIN I (HIGH SENSITIVITY): Troponin I (High Sensitivity): 3 ng/L (ref <18)

## 2019-08-22 LAB — CBC
HCT: 40.2 % (ref 36.0–46.0)
Hemoglobin: 13.3 g/dL (ref 12.0–15.0)
MCH: 29.4 pg (ref 26.0–34.0)
MCHC: 33.1 g/dL (ref 30.0–36.0)
MCV: 88.7 fL (ref 80.0–100.0)
Platelets: 345 10*3/uL (ref 150–400)
RBC: 4.53 MIL/uL (ref 3.87–5.11)
RDW: 13.9 % (ref 11.5–15.5)
WBC: 10.8 10*3/uL — ABNORMAL HIGH (ref 4.0–10.5)
nRBC: 0 % (ref 0.0–0.2)

## 2019-08-22 LAB — BASIC METABOLIC PANEL
Anion gap: 10 (ref 5–15)
BUN: 9 mg/dL (ref 6–20)
CO2: 32 mmol/L (ref 22–32)
Calcium: 9 mg/dL (ref 8.9–10.3)
Chloride: 102 mmol/L (ref 98–111)
Creatinine, Ser: 0.85 mg/dL (ref 0.44–1.00)
GFR calc Af Amer: >60 mL/min (ref >60)
GFR calc non Af Amer: >60 mL/min (ref >60)
Glucose, Bld: 112 mg/dL — ABNORMAL HIGH (ref 70–99)
Potassium: 3.8 mmol/L (ref 3.5–5.1)
Sodium: 144 mmol/L (ref 135–145)

## 2019-08-22 NOTE — ED Triage Notes (Addendum)
Pt c/o left sided chest pain with palpitations x2 days as well as general fatigue. Pt denies N/V and SOB. ((During EKG, pt reports that she used meth with a dirty needle last Thursday and these symptoms started since.))

## 2019-08-23 ENCOUNTER — Emergency Department
Admission: EM | Admit: 2019-08-23 | Discharge: 2019-08-23 | Disposition: A | Discharge status: Home / Self Care | Payer: Self-pay | Attending: Emergency Medicine | Admitting: Emergency Medicine

## 2019-08-23 ENCOUNTER — Ambulatory Visit: Payer: Self-pay | Admitting: Gerontology

## 2019-08-23 NOTE — ED Notes (Signed)
No answer when called several times from lobby 

## 2019-09-19 ENCOUNTER — Ambulatory Visit (INDEPENDENT_AMBULATORY_CARE_PROVIDER_SITE_OTHER): Payer: Self-pay | Admitting: Gastroenterology

## 2019-09-19 ENCOUNTER — Encounter: Payer: Self-pay | Admitting: Gastroenterology

## 2019-09-19 ENCOUNTER — Other Ambulatory Visit: Payer: Self-pay

## 2019-09-19 VITALS — BP 96/64 | HR 78 | Ht 69.0 in | Wt 190.0 lb

## 2019-09-19 DIAGNOSIS — R768 Other specified abnormal immunological findings in serum: Secondary | ICD-10-CM
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
## 2019-09-22 ENCOUNTER — Encounter: Payer: Self-pay | Admitting: Gastroenterology

## 2019-09-22 LAB — HEPATITIS B SURFACE ANTIGEN: Hepatitis B Surface Ag: NEGATIVE

## 2019-09-22 LAB — HEPATITIS C GENOTYPE

## 2019-09-22 LAB — HIV ANTIBODY (ROUTINE TESTING W REFLEX): HIV Screen 4th Generation wRfx: NONREACTIVE

## 2019-09-22 LAB — HEPATITIS A ANTIBODY, TOTAL: hep A Total Ab: NEGATIVE

## 2019-09-22 LAB — HEPATITIS B E ANTIBODY: Hep B E Ab: NEGATIVE

## 2019-09-22 LAB — HEPATITIS B E ANTIGEN: Hep B E Ag: NEGATIVE

## 2019-09-22 LAB — HEPATITIS B CORE ANTIBODY, TOTAL: Hep B Core Total Ab: NEGATIVE

## 2019-09-26 ENCOUNTER — Ambulatory Visit: Payer: Self-pay | Attending: Gastroenterology

## 2019-10-23 ENCOUNTER — Other Ambulatory Visit: Payer: Self-pay
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
## 2020-03-05 ENCOUNTER — Encounter (INDEPENDENT_AMBULATORY_CARE_PROVIDER_SITE_OTHER): Payer: Self-pay | Admitting: *Deleted

## 2020-06-13 ENCOUNTER — Ambulatory Visit (INDEPENDENT_AMBULATORY_CARE_PROVIDER_SITE_OTHER): Payer: Self-pay | Admitting: Gastroenterology

## 2020-06-13 ENCOUNTER — Encounter (INDEPENDENT_AMBULATORY_CARE_PROVIDER_SITE_OTHER): Payer: Self-pay | Admitting: Gastroenterology

## 2020-06-13 ENCOUNTER — Other Ambulatory Visit: Payer: Self-pay

## 2020-06-13 VITALS — BP 111/73 | HR 62 | Temp 98.1°F | Ht 69.0 in | Wt 188.0 lb

## 2020-06-13 DIAGNOSIS — K589 Irritable bowel syndrome without diarrhea: Secondary | ICD-10-CM | POA: Insufficient documentation

## 2020-06-13 DIAGNOSIS — B182 Chronic viral hepatitis C: Secondary | ICD-10-CM | POA: Insufficient documentation

## 2020-06-13 DIAGNOSIS — F1911 Other psychoactive substance abuse, in remission: Secondary | ICD-10-CM

## 2020-06-13 MED ORDER — DICYCLOMINE HCL 10 MG PO CAPS: 10.0000 mg | ORAL_CAPSULE | Freq: Two times a day (BID) | ORAL | 5 refills | Status: AC

## 2020-06-13 NOTE — Progress Notes (Signed)
Gail Castro, M.D. Gastroenterology & Hepatology [facility] [address] [place], [place] [address] Primary Care Physician: Beatrix Fetters, MD [address] [place] [address]  Referring MD: PCP  Chief Complaint: Hepatitis C  History of Present Illness: Gail Castro is a 40 y.o. female with PMH prediabetes, hepatitis C, IV drug use, who presents for evaluation of hepatitis C.  Patient reports that she knew about her diagnosis of hepatitis C close to 5-8 years ago.  States that she was referred to our clinic for further evaluation and management of this.  She believes that she acquired infection in the past as she used to use IV drugs. She states that the last time she used any IV drugs 04/2019. She was in a rehab program to stop drug use. She is currently on suboxone and has a Veterinary surgeon which has helped her remain abstinent for the last year.  Patient states that she had tattoos in the past.  Never had a transfusion in the past.  She reports that she has some nausea and has a history of watery bowel movements, between 3-9 times a day.  She relates that these symptoms started after she was put on metformin for management of her prediabetes. She reports before having the metformin she had some bloating and abdominal pain in the epigastric area and LUQ, with fluctuating intensity for the last 8 years.  In fact she found out about her hepatitis C when she started seeing her PCP for evaluation of pain.  She reports seen some blood in her stool once a year ago, but has not happened again.  The patient denies having any nausea, vomiting, fever, chills, melena, hematemesis,  jaundice, pruritus or weight loss.  Review of her most recent blood tests performed on 09/19/2019 showed negative hepatitis B surface antigen and core total antibody, negative hepatitis A total antibody, negative HIV, positive hepatitis C genotype Ia.  No viral load is  available although it was ordered at the same time.  Most recent CMP is from 07/12/2019 which showed an ALT of 47, AST of 43, total bilirubin 0.2, alkaline phosphatase 83, normal electrolytes and renal function, positive hepatitis B C antibody.  CBC we will psychometrically system hemoglobin 13.2 and platelet count 349.  Last XYI:AXKPV Last Colonoscopy:never  FHx: neg for any gastrointestinal/liver disease, aunt pancreas cancer Social: smokes 3-4 cigs per day. Drugs as above. Use to drink alcohol heavily in the past, usually binge drinking - last time she drank was in 04/2019. Surgical: cholecystectomy, tubal ligation  Past Medical History: Past Medical History:  Diagnosis Date  . Anxiety   . Bipolar 1 disorder (HCC)   . Cocaine use   . ETOH abuse   . Hepatitis C   . Heroin abuse (HCC)   . MRSA (methicillin resistant Staphylococcus aureus)   . Suicide and self-inflicted injury (HCC)   . Suicide ideation   . Tobacco abuse     Past Surgical History: Past Surgical History:  Procedure Laterality Date  . arm    . CHOLECYSTECTOMY    . EYE SURGERY    . TUBAL LIGATION      Family History: Family History  Problem Relation Age of Onset  . Healthy Mother   . Hypertension Father     Social History: Social History   Tobacco Use  Smoking Status Current Every Day Smoker  . Packs/day: 0.25  . Types: Cigarettes  Smokeless Tobacco Never Used   Social  History   Substance and Sexual Activity  Alcohol Use Not Currently   Social History   Substance and Sexual Activity  Drug Use Not Currently   Comment: substance free per patient she is on Suboxone    Allergies: No Known Allergies  Medications: Current Outpatient Medications  Medication Sig Dispense Refill  . acyclovir (ZOVIRAX) 400 MG tablet Take 1 tablet (400 mg total) by mouth daily. 30 tablet 0  . Buprenorphine HCl-Naloxone HCl (SUBOXONE) 8-2 MG FILM Place 1 Film under the tongue in the morning and at bedtime.    .  dicyclomine (BENTYL) 10 MG capsule Take 1 capsule (10 mg total) by mouth 2 (two) times daily before a meal. 60 capsule 5  . metFORMIN (GLUCOPHAGE) 500 MG tablet Take 500 mg by mouth 2 (two) times daily with a meal.    . pantoprazole (PROTONIX) 20 MG tablet Take 1 tablet (20 mg total) by mouth daily. 30 tablet 2   No current facility-administered medications for this visit.    Review of Systems: GENERAL: negative for malaise, night sweats HEENT: No changes in hearing or vision, no nose bleeds or other nasal problems. NECK: Negative for lumps, goiter, pain and significant neck swelling RESPIRATORY: Negative for cough, wheezing CARDIOVASCULAR: Negative for chest pain, leg swelling, palpitations, orthopnea GI: SEE HPI MUSCULOSKELETAL: Negative for joint pain or swelling, back pain, and muscle pain. SKIN: Negative for lesions, rash PSYCH: Negative for sleep disturbance, mood disorder and recent psychosocial stressors. HEMATOLOGY Negative for prolonged bleeding, bruising easily, and swollen nodes. ENDOCRINE: Negative for cold or heat intolerance, polyuria, polydipsia and goiter. NEURO: negative for tremor, gait imbalance, syncope and seizures. The remainder of the review of systems is noncontributory.   Physical Exam: BP 111/73 (BP Location: Left Arm, Patient Position: Sitting, Cuff Size: Large)   Pulse 62   Temp 98.1 F (36.7 C) (Oral)   Ht 5\' 9"  (1.753 m)   Wt 188 lb (85.3 kg)   BMI 27.76 kg/m  GENERAL: The patient is AO x3, in no acute distress. HEENT: Head is normocephalic and atraumatic. EOMI are intact. Mouth is well hydrated and without lesions. NECK: Supple. No masses LUNGS: Clear to auscultation. No presence of rhonchi/wheezing/rales. Adequate chest expansion HEART: RRR, normal s1 and s2. ABDOMEN: Soft, nontender, no guarding, no peritoneal signs, and nondistended. BS +. No masses. EXTREMITIES: Without any cyanosis, clubbing, rash, lesions or edema. NEUROLOGIC: AOx3, no  focal motor deficit. SKIN: no jaundice, no rashes   Imaging/Labs: as above  I personally reviewed and interpreted the available labs, imaging and endoscopic files.  Impression and Plan: Gail Castro is a 40 y.o. female with PMH prediabetes, hepatitis C, IV drug use, who presents for evaluation of hepatitis C. patient is presenting active chronic hepatitis C Ia with elevation of her liver enzymes.  It was likely acquired by IV drug use.  She has remained abstinent from drug use, will recheck urine toxicology today.  She is secondary for treatment, does not have any concomitant hepatitis B or HIV.  Will obtain today repeat CBC, CMP and viral load to assess her baseline numbers.  We will also order a liver elastography given the chronicity of her disease to assess for presence of concomitant fibrosis.  Most likely will prescribe Epclusa.  I advised the patient that she will need to stop taking PPI while she is taking her hepatitis C medication which she reports is okay with her as she takes the medication very rarely.  Finally, patient is presenting  chronic symptoms of bloating, abdominal pain and diarrhea which I think have 2 possible components: She is likely having side effect due to the Metformin, for which she will need to discuss with her PCP if there are any options for management of her prediabetes as the timeline of her symptoms goes along with the start of the medication.  It is also possible that she has some baseline bowel hypersensitivity/irritable bowel syndrome given the presence of bloating before starting the medication.  I advised the patient to start Bentyl as needed for episodes of abdominal pain but also to implement a low FODMAP diet.  Will check celiac serologies today.  Patient understood and agreed.  -Check CBC, CMP, TTG IgA, hepatitis C RNA and urine toxicology -Schedule liver elastography -Start Bentyl1 tablet q12h as needed for abdominal pain -Explained presumed etiology  of IBS symptoms. Patient was counseled about the benefit of implementing a low FODMAP to improve symptoms and recurrent episodes. A dietary list was provided to the patient. Also, the patient was counseled about the benefit of avoiding stressing situations and potential environmental triggers leading to symptomatology. - Patient to stop taking PPI once start on hepatitis C medication  All questions were answered.      Gail Blazing, MD Gastroenterology and Hepatology [facility]

## 2020-06-13 NOTE — Patient Instructions (Addendum)
Perform blood workup Schedule liver elastography Start Bentyl1 tablet q12h as needed for abdominal pain Explained presumed etiology of IBS symptoms. Patient was counseled about the benefit of implementing a low FODMAP to improve symptoms and recurrent episodes. A dietary list was provided to the patient. Also, the patient was counseled about the benefit of avoiding stressing situations and potential environmental triggers leading to symptomatology.

## 2020-06-17 LAB — COMPREHENSIVE METABOLIC PANEL
AG Ratio: 1.4 (calc) (ref 1.0–2.5)
ALT: 34 U/L — ABNORMAL HIGH (ref 6–29)
AST: 29 U/L (ref 10–30)
Albumin: 4.1 g/dL (ref 3.6–5.1)
Alkaline phosphatase (APISO): 80 U/L (ref 31–125)
BUN: 12 mg/dL (ref 7–25)
CO2: 29 mmol/L (ref 20–32)
Calcium: 9.4 mg/dL (ref 8.6–10.2)
Chloride: 106 mmol/L (ref 98–110)
Creat: 0.62 mg/dL (ref 0.50–1.10)
Globulin: 2.9 g/dL (calc) (ref 1.9–3.7)
Glucose, Bld: 87 mg/dL (ref 65–139)
Potassium: 4.1 mmol/L (ref 3.5–5.3)
Sodium: 141 mmol/L (ref 135–146)
Total Bilirubin: 0.3 mg/dL (ref 0.2–1.2)
Total Protein: 7 g/dL (ref 6.1–8.1)

## 2020-06-17 LAB — CBC WITH DIFFERENTIAL/PLATELET
Absolute Monocytes: 598 cells/uL (ref 200–950)
Basophils Absolute: 91 cells/uL (ref 0–200)
Basophils Relative: 1.1 %
Eosinophils Absolute: 291 cells/uL (ref 15–500)
Eosinophils Relative: 3.5 %
HCT: 40.8 % (ref 35.0–45.0)
Hemoglobin: 13.5 g/dL (ref 11.7–15.5)
Lymphs Abs: 2714 cells/uL (ref 850–3900)
MCH: 28.4 pg (ref 27.0–33.0)
MCHC: 33.1 g/dL (ref 32.0–36.0)
MCV: 85.9 fL (ref 80.0–100.0)
MPV: 9.6 fL (ref 7.5–12.5)
Monocytes Relative: 7.2 %
Neutro Abs: 4607 cells/uL (ref 1500–7800)
Neutrophils Relative %: 55.5 %
Platelets: 343 10*3/uL (ref 140–400)
RBC: 4.75 10*6/uL (ref 3.80–5.10)
RDW: 13.1 % (ref 11.0–15.0)
Total Lymphocyte: 32.7 %
WBC: 8.3 10*3/uL (ref 3.8–10.8)

## 2020-06-17 LAB — IGA: Immunoglobulin A: 257 mg/dL (ref 47–310)

## 2020-06-17 LAB — HEPATITIS C RNA QUANTITATIVE
HCV Quantitative Log: 6.42 log IU/mL — ABNORMAL HIGH
HCV RNA, PCR, QN: 2640000 IU/mL — ABNORMAL HIGH

## 2020-06-17 LAB — TISSUE TRANSGLUTAMINASE, IGA: (tTG) Ab, IgA: <1 U/mL

## 2020-06-17 LAB — DM TEMPLATE

## 2020-06-17 LAB — DRUG MONITOR,PCP,SCREEN,URINE: Phencyclidine: NEGATIVE ng/mL (ref <25)

## 2020-06-18 ENCOUNTER — Ambulatory Visit (HOSPITAL_COMMUNITY)
Admission: RE | Admit: 2020-06-18 | Discharge: 2020-06-18 | Disposition: A | Discharge status: Home / Self Care | Payer: Commercial Managed Care - PPO | Source: Ambulatory Visit | Attending: Gastroenterology | Admitting: Gastroenterology

## 2020-06-18 DIAGNOSIS — B182 Chronic viral hepatitis C: Secondary | ICD-10-CM | POA: Insufficient documentation

## 2020-06-19 ENCOUNTER — Other Ambulatory Visit (INDEPENDENT_AMBULATORY_CARE_PROVIDER_SITE_OTHER): Payer: Self-pay | Admitting: Gastroenterology

## 2020-06-19 DIAGNOSIS — B182 Chronic viral hepatitis C: Secondary | ICD-10-CM

## 2020-06-19 MED ORDER — SOFOSBUVIR-VELPATASVIR 400-100 MG PO TABS: 1.0000 | ORAL_TABLET | Freq: Every day | ORAL | 2 refills | Status: DC

## 2020-06-24 ENCOUNTER — Other Ambulatory Visit (INDEPENDENT_AMBULATORY_CARE_PROVIDER_SITE_OTHER): Payer: Self-pay

## 2020-06-24 DIAGNOSIS — B182 Chronic viral hepatitis C: Secondary | ICD-10-CM

## 2020-06-24 MED ORDER — SOFOSBUVIR-VELPATASVIR 400-100 MG PO TABS: 1.0000 | ORAL_TABLET | Freq: Every day | ORAL | 2 refills | Status: DC

## 2020-06-27 ENCOUNTER — Other Ambulatory Visit (INDEPENDENT_AMBULATORY_CARE_PROVIDER_SITE_OTHER): Payer: Self-pay | Admitting: Gastroenterology

## 2020-06-27 ENCOUNTER — Encounter (INDEPENDENT_AMBULATORY_CARE_PROVIDER_SITE_OTHER): Payer: Self-pay

## 2020-06-27 DIAGNOSIS — B182 Chronic viral hepatitis C: Secondary | ICD-10-CM

## 2020-06-27 MED ORDER — MAVYRET 50-20 MG PO PACK: 6.0000 | PACK | Freq: Every day | ORAL | 1 refills | Status: DC

## 2020-06-27 NOTE — Progress Notes (Signed)
Given insurance coverage, order was switched to Mavyret for 8 weeks working hepatitis C.  We will cancel the order of Epclusa.

## 2020-07-01 ENCOUNTER — Other Ambulatory Visit (INDEPENDENT_AMBULATORY_CARE_PROVIDER_SITE_OTHER): Payer: Self-pay

## 2020-07-01 ENCOUNTER — Other Ambulatory Visit (INDEPENDENT_AMBULATORY_CARE_PROVIDER_SITE_OTHER): Payer: Self-pay | Admitting: Gastroenterology

## 2020-07-01 DIAGNOSIS — B182 Chronic viral hepatitis C: Secondary | ICD-10-CM

## 2020-07-01 MED ORDER — MAVYRET 100-40 MG PO TABS: 3.0000 | ORAL_TABLET | Freq: Every day | ORAL | 1 refills | Status: DC

## 2020-07-04 ENCOUNTER — Other Ambulatory Visit (INDEPENDENT_AMBULATORY_CARE_PROVIDER_SITE_OTHER): Payer: Self-pay

## 2020-07-04 DIAGNOSIS — K589 Irritable bowel syndrome without diarrhea: Secondary | ICD-10-CM

## 2020-07-04 DIAGNOSIS — F1911 Other psychoactive substance abuse, in remission: Secondary | ICD-10-CM

## 2020-07-04 DIAGNOSIS — B182 Chronic viral hepatitis C: Secondary | ICD-10-CM

## 2020-07-04 DIAGNOSIS — R768 Other specified abnormal immunological findings in serum: Secondary | ICD-10-CM

## 2020-08-04 LAB — COMPREHENSIVE METABOLIC PANEL
AG Ratio: 1.2 (calc) (ref 1.0–2.5)
ALT: 8 U/L (ref 6–29)
AST: 15 U/L (ref 10–30)
Albumin: 3.8 g/dL (ref 3.6–5.1)
Alkaline phosphatase (APISO): 68 U/L (ref 31–125)
BUN: 12 mg/dL (ref 7–25)
CO2: 26 mmol/L (ref 20–32)
Calcium: 8.9 mg/dL (ref 8.6–10.2)
Chloride: 103 mmol/L (ref 98–110)
Creat: 0.67 mg/dL (ref 0.50–1.10)
Globulin: 3.1 g/dL (calc) (ref 1.9–3.7)
Glucose, Bld: 80 mg/dL (ref 65–99)
Potassium: 3.9 mmol/L (ref 3.5–5.3)
Sodium: 140 mmol/L (ref 135–146)
Total Bilirubin: 0.2 mg/dL (ref 0.2–1.2)
Total Protein: 6.9 g/dL (ref 6.1–8.1)

## 2020-08-04 LAB — HEPATITIS C RNA QUANTITATIVE
HCV Quantitative Log: <1.18 log IU/mL — ABNORMAL HIGH
HCV RNA, PCR, QN: <15 IU/mL — ABNORMAL HIGH

## 2020-08-27 ENCOUNTER — Ambulatory Visit (INDEPENDENT_AMBULATORY_CARE_PROVIDER_SITE_OTHER): Payer: Commercial Managed Care - PPO | Admitting: Gastroenterology

## 2020-09-25 ENCOUNTER — Ambulatory Visit (INDEPENDENT_AMBULATORY_CARE_PROVIDER_SITE_OTHER): Payer: Commercial Managed Care - PPO | Admitting: Gastroenterology

## 2020-09-25 ENCOUNTER — Other Ambulatory Visit: Payer: Self-pay

## 2020-09-25 ENCOUNTER — Encounter (INDEPENDENT_AMBULATORY_CARE_PROVIDER_SITE_OTHER): Payer: Self-pay | Admitting: Gastroenterology

## 2020-09-25 VITALS — BP 113/77 | HR 84 | Temp 98.8°F | Ht 69.0 in | Wt 187.8 lb

## 2020-09-25 DIAGNOSIS — B182 Chronic viral hepatitis C: Secondary | ICD-10-CM

## 2020-09-25 DIAGNOSIS — K219 Gastro-esophageal reflux disease without esophagitis: Secondary | ICD-10-CM

## 2020-09-25 NOTE — Patient Instructions (Signed)
Josepha Pigg course Perform blood workup on Monday Repeat blood workup in October 2022 (last week) Can restart Protonix for GERD

## 2020-09-25 NOTE — Progress Notes (Signed)
Gail Castro, M.D. Gastroenterology & Hepatology [facility] [address] [place], [place] [address]  Primary Care Physician: Beatrix Fetters, MD [address] [place] [address]  I will communicate my assessment and recommendations to the referring MD via EMR.  Problems: Hepatitis C genotype Ia status post Epclusa treatment Metformin induced diarrhea GERD  History of Present Illness: Gail Castro is a 40 y.o. female with PMH prediabetes, anxiety, bipolar disorder, hepatitis C, IV drug use, who presents for follow up of hepatitis C.  The patient was last seen on 06/13/2020. She was found to have active hep C genotype 1a, with neg HBV serologies or HIV. Liver elastography performed showing 4.1 kPa, IQR ratio 0.1.  Ultrasound showed presence of fatty liver and status postcholecystectomy without other alterations. I also advised the patient to stop PPI while taking the Epclusa. She was also advised to follow a low FODMAP diet for chronic bloating, diarrhea and abdominal pain.  The patient reports feeling well and denies having any symptoms.  Her most recent blood testing on 08/02/2020 showed normal CMP with AST 15, ALT 8 and neg HCV RNA.  States that she still has 2 more pills to finish her Epclusa treatment. The patient denies having any nausea, vomiting, fever, chills, hematochezia, melena, hematemesis, abdominal distention, abdominal pain, diarrhea, jaundice, pruritus or weight loss.  Stated she is having some heartburn issues as she followed compliantly the recommendation to avoid PPIs while taking her antiviral medication.  Patient reports that due to her multiple gastrointestinal complaints, she had her metformin stopped and her loose bowel movememtn and abdominal pain resolved.  Last SPQ:ZRAQT Last Colonoscopy:never  Past Medical History: Past Medical History:  Diagnosis Date   Anxiety    Bipolar 1 disorder (HCC)    Cocaine  use    ETOH abuse    Hepatitis C    Heroin abuse (HCC)    MRSA (methicillin resistant Staphylococcus aureus)    Suicide and self-inflicted injury (HCC)    Suicide ideation    Tobacco abuse     Past Surgical History: Past Surgical History:  Procedure Laterality Date   arm     CHOLECYSTECTOMY     EYE SURGERY     TUBAL LIGATION      Family History: Family History  Problem Relation Age of Onset   Healthy Mother    Hypertension Father     Social History: Social History   Tobacco Use  Smoking Status Every Day   Packs/day: 0.25   Types: Cigarettes  Smokeless Tobacco Never   Social History   Substance and Sexual Activity  Alcohol Use Not Currently   Social History   Substance and Sexual Activity  Drug Use Not Currently   Comment: substance free per patient she is on Suboxone    Allergies: No Known Allergies  Medications: Current Outpatient Medications  Medication Sig Dispense Refill   acyclovir (ZOVIRAX) 400 MG tablet Take 1 tablet (400 mg total) by mouth daily. 30 tablet 0   Buprenorphine HCl-Naloxone HCl 8-2 MG FILM Place 1 Film under the tongue in the morning and at bedtime.     dicyclomine (BENTYL) 10 MG capsule Take 1 capsule (10 mg total) by mouth 2 (two) times daily before a meal. (Patient taking differently: Take 10 mg by mouth 2 (two) times daily before a meal. As needed per patient.) 60 capsule 5   phentermine 37.5 MG capsule Take 37.5 mg by mouth every morning.  metFORMIN (GLUCOPHAGE) 500 MG tablet Take 500 mg by mouth 2 (two) times daily with a meal. (Patient not taking: Reported on 09/25/2020)     pantoprazole (PROTONIX) 20 MG tablet Take 1 tablet (20 mg total) by mouth daily. (Patient not taking: Reported on 09/25/2020) 30 tablet 2   No current facility-administered medications for this visit.    Review of Systems: GENERAL: negative for malaise, night sweats HEENT: No changes in hearing or vision, no nose bleeds or other nasal problems. NECK:  Negative for lumps, goiter, pain and significant neck swelling RESPIRATORY: Negative for cough, wheezing CARDIOVASCULAR: Negative for chest pain, leg swelling, palpitations, orthopnea GI: SEE HPI MUSCULOSKELETAL: Negative for joint pain or swelling, back pain, and muscle pain. SKIN: Negative for lesions, rash PSYCH: Negative for sleep disturbance, mood disorder and recent psychosocial stressors. HEMATOLOGY Negative for prolonged bleeding, bruising easily, and swollen nodes. ENDOCRINE: Negative for cold or heat intolerance, polyuria, polydipsia and goiter. NEURO: negative for tremor, gait imbalance, syncope and seizures. The remainder of the review of systems is noncontributory.   Physical Exam: BP 113/77 (BP Location: Right Arm, Patient Position: Sitting, Cuff Size: Large)   Pulse 84   Temp 98.8 F (37.1 C) (Oral)   Ht 5\' 9"  (1.753 m)   Wt 187 lb 12.8 oz (85.2 kg)   BMI 27.73 kg/m  GENERAL: The patient is AO x3, in no acute distress. HEENT: Head is normocephalic and atraumatic. EOMI are intact. Mouth is well hydrated and without lesions. NECK: Supple. No masses LUNGS: Clear to auscultation. No presence of rhonchi/wheezing/rales. Adequate chest expansion HEART: RRR, normal s1 and s2. ABDOMEN: Soft, nontender, no guarding, no peritoneal signs, and nondistended. BS +. No masses. EXTREMITIES: Without any cyanosis, clubbing, rash, lesions or edema. NEUROLOGIC: AOx3, no focal motor deficit. SKIN: no jaundice, no rashes  Imaging/Labs: as above  I personally reviewed and interpreted the available labs, imaging and endoscopic files.  Impression and Plan: Gail Castro is a 40 y.o. female with PMH prediabetes, anxiety, bipolar disorder, hepatitis C, IV drug use, who presents for follow up of hepatitis C. the patient has taken the Epclusa compliantly and is almost done with her 12-week course for type I a hepatitis C.  Her most recent blood work-up showed normalization of her LFTs and  suppression of the viral load.  I encouraged her to finish the remaining pills she has left and have a repeat CMP in HCVRNA checked on Monday.  We will confirm if she achieved SVR by repeating this test on late October.  The patient understood and agreed.  As she has presented recurrent episodes of GERD, she can restart her Protonix when she has finished the antiviral medication.  Finally, she had some changes in her bowel movements which were related to the use of metformin as these have completely resolved after she stopped taking the medication.  No further action is warranted at this point.  - November course - Check CMP and HCV  on Monday - Repeat CMP and HCV RNA in October 2022 (last week) - Can restart Protonix for GERD  All questions were answered.      November 2022, MD Gastroenterology and Hepatology [facility]

## 2020-10-07 LAB — COMPREHENSIVE METABOLIC PANEL
AG Ratio: 1.5 (calc) (ref 1.0–2.5)
ALT: 11 U/L (ref 6–29)
AST: 15 U/L (ref 10–30)
Albumin: 4 g/dL (ref 3.6–5.1)
Alkaline phosphatase (APISO): 66 U/L (ref 31–125)
BUN: 15 mg/dL (ref 7–25)
CO2: 27 mmol/L (ref 20–32)
Calcium: 9.1 mg/dL (ref 8.6–10.2)
Chloride: 105 mmol/L (ref 98–110)
Creat: 0.79 mg/dL (ref 0.50–0.97)
Globulin: 2.7 g/dL (calc) (ref 1.9–3.7)
Glucose, Bld: 83 mg/dL (ref 65–139)
Potassium: 3.8 mmol/L (ref 3.5–5.3)
Sodium: 138 mmol/L (ref 135–146)
Total Bilirubin: 0.3 mg/dL (ref 0.2–1.2)
Total Protein: 6.7 g/dL (ref 6.1–8.1)

## 2020-10-07 LAB — HCV RNA, QUANT REAL-TIME PCR W/REFLEX
HCV RNA, PCR, QN (Log): <1.18 LogIU/mL
HCV RNA, PCR, QN: <15 IU/mL

## 2020-12-27 ENCOUNTER — Telehealth (INDEPENDENT_AMBULATORY_CARE_PROVIDER_SITE_OTHER): Payer: Self-pay

## 2020-12-27 ENCOUNTER — Encounter (INDEPENDENT_AMBULATORY_CARE_PROVIDER_SITE_OTHER): Payer: Self-pay

## 2020-12-27 DIAGNOSIS — B182 Chronic viral hepatitis C: Secondary | ICD-10-CM

## 2021-01-17 ENCOUNTER — Telehealth (INDEPENDENT_AMBULATORY_CARE_PROVIDER_SITE_OTHER): Payer: Self-pay

## 2021-01-17 NOTE — Telephone Encounter (Signed)
Thanks for the update

## 2021-01-17 NOTE — Telephone Encounter (Signed)
01/17/2021: Patient still has not had her labs drawn and not responding to calls or letters form the office.  01/13/2021: I called left a message to see if she had her labs drawn.Asked that she please return call to the office.    12/27/2020 Mailed the orders and letter for Repeat labs for Hep C clearance.

## 2021-02-13 LAB — COMPREHENSIVE METABOLIC PANEL
AG Ratio: 1.6 (calc) (ref 1.0–2.5)
ALT: 10 U/L (ref 6–29)
AST: 15 U/L (ref 10–30)
Albumin: 4 g/dL (ref 3.6–5.1)
Alkaline phosphatase (APISO): 55 U/L (ref 31–125)
BUN: 15 mg/dL (ref 7–25)
CO2: 26 mmol/L (ref 20–32)
Calcium: 9 mg/dL (ref 8.6–10.2)
Chloride: 105 mmol/L (ref 98–110)
Creat: 0.72 mg/dL (ref 0.50–0.99)
Globulin: 2.5 g/dL (calc) (ref 1.9–3.7)
Glucose, Bld: 61 mg/dL — ABNORMAL LOW (ref 65–99)
Potassium: 3.8 mmol/L (ref 3.5–5.3)
Sodium: 139 mmol/L (ref 135–146)
Total Bilirubin: 0.3 mg/dL (ref 0.2–1.2)
Total Protein: 6.5 g/dL (ref 6.1–8.1)

## 2021-02-13 LAB — HEPATITIS C RNA QUANTITATIVE
HCV Quantitative Log: <1.18 log IU/mL
HCV RNA, PCR, QN: <15 IU/mL

## 2021-02-20 ENCOUNTER — Other Ambulatory Visit: Payer: Self-pay

## 2021-02-22 IMAGING — CR DG CHEST 2V
2 series · 2 of 2 positions shown · non-contrast
Comparison: 11/27/2018

CLINICAL DATA: Chest pain

EXAM:
CHEST - 2 VIEW

[w chest lat]
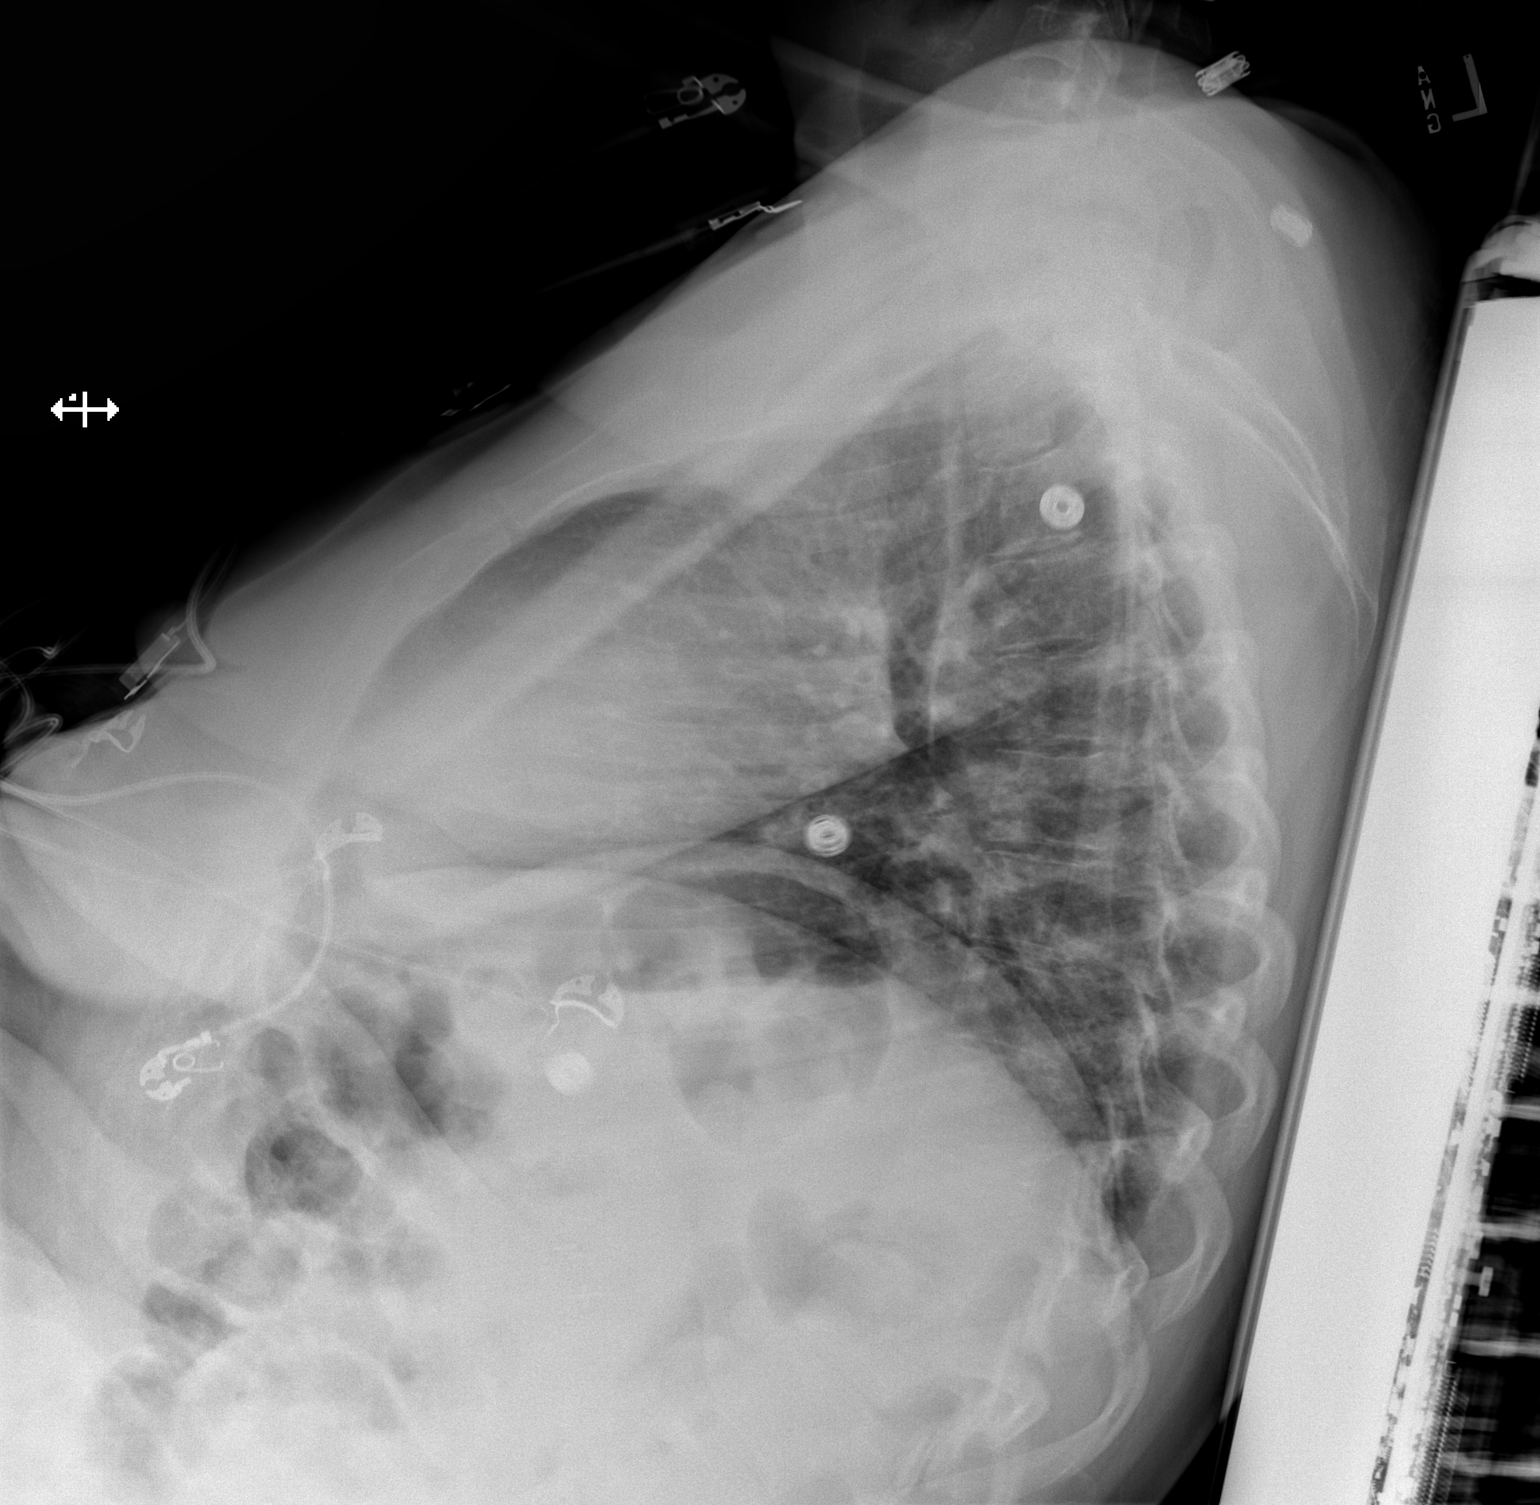

[x chest ap]
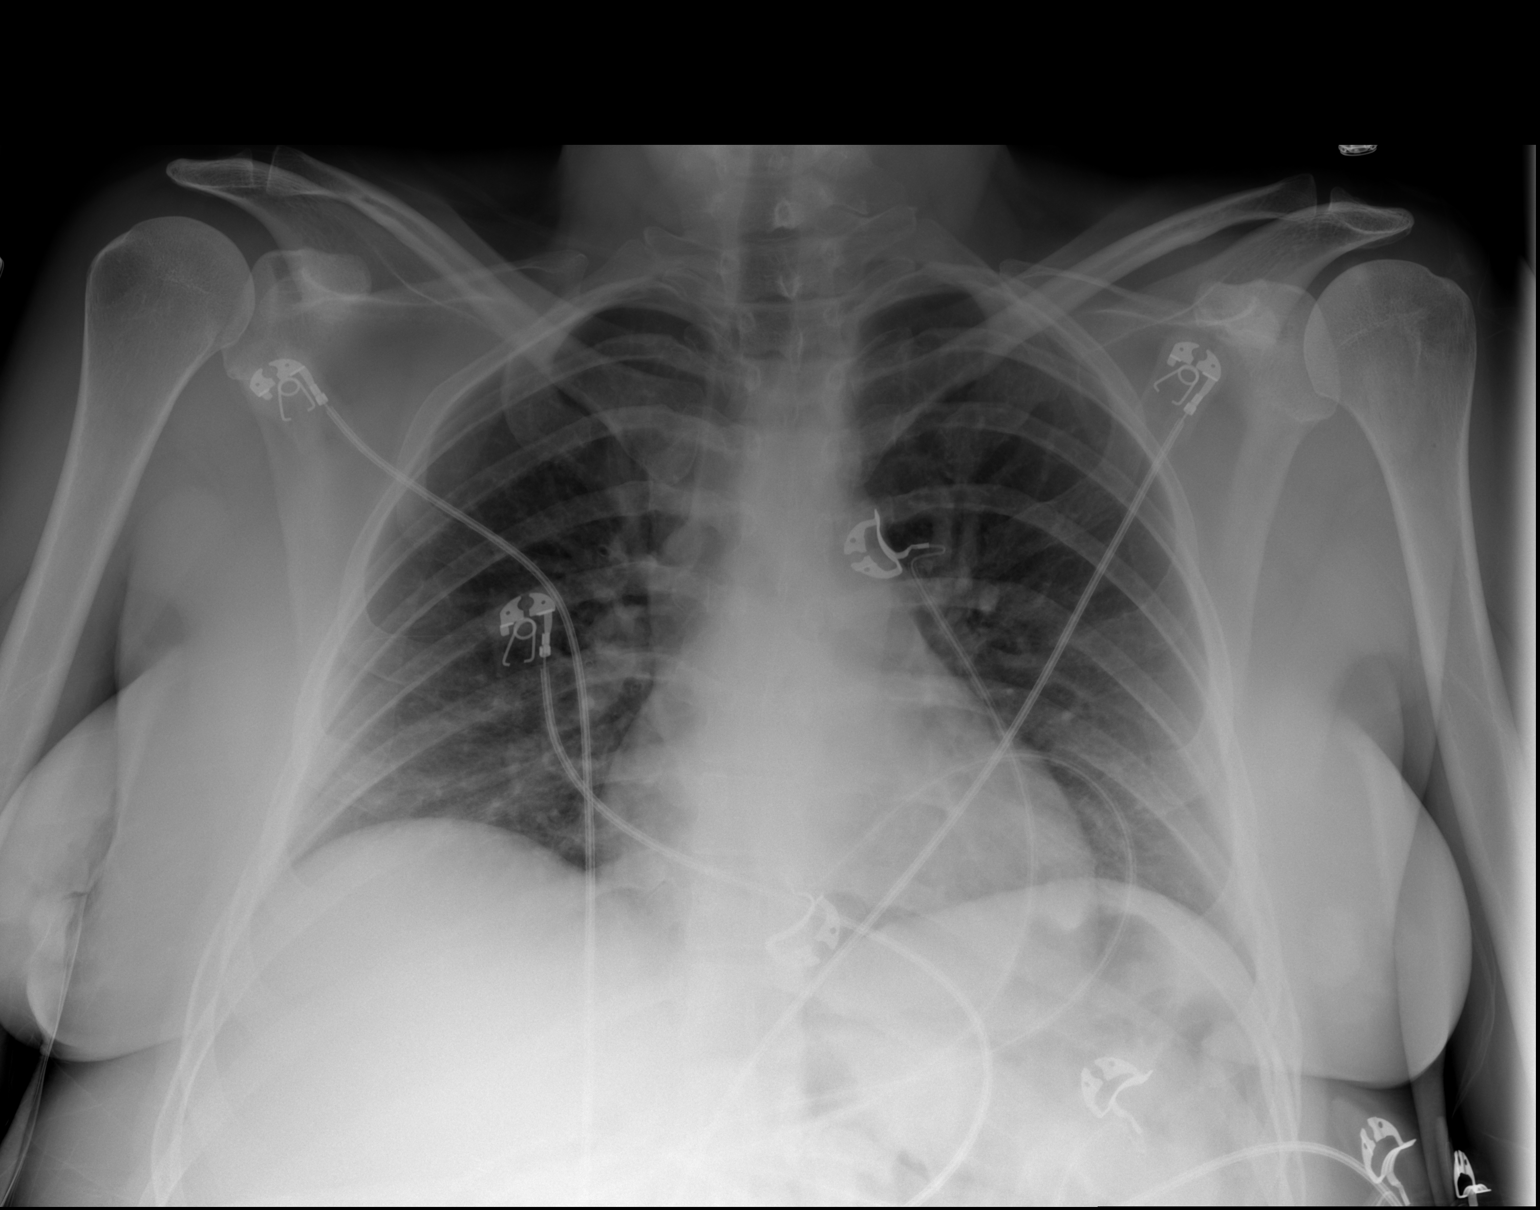

[2 of 2 positions shown; findings below may reference images not displayed]

FINDINGS: The heart size and mediastinal contours are within normal limits.
Both lungs are clear. The visualized skeletal structures are
unremarkable.
IMPRESSION: No active cardiopulmonary disease.
